# Patient Record
Sex: Male | Born: 1960 | Race: White | Hispanic: No | Marital: Single | Smoking: Former smoker
Health system: Southern US, Community
[De-identification: ages and names within clinical notes are randomized; demographics above are authoritative.]

## PROBLEM LIST (undated history)

## (undated) DIAGNOSIS — M459 Ankylosing spondylitis of unspecified sites in spine: Secondary | ICD-10-CM

## (undated) DIAGNOSIS — I10 Essential (primary) hypertension: Secondary | ICD-10-CM

## (undated) DIAGNOSIS — M549 Dorsalgia, unspecified: Secondary | ICD-10-CM

## (undated) DIAGNOSIS — M1990 Unspecified osteoarthritis, unspecified site: Secondary | ICD-10-CM

## (undated) DIAGNOSIS — M533 Sacrococcygeal disorders, not elsewhere classified: Secondary | ICD-10-CM

## (undated) DIAGNOSIS — G4730 Sleep apnea, unspecified: Secondary | ICD-10-CM

## (undated) DIAGNOSIS — G8929 Other chronic pain: Secondary | ICD-10-CM

## (undated) HISTORY — DX: Dorsalgia, unspecified: M54.9

## (undated) HISTORY — DX: Sleep apnea, unspecified: G47.30

## (undated) HISTORY — DX: Essential (primary) hypertension: I10

## (undated) HISTORY — DX: Ankylosing spondylitis of unspecified sites in spine: M45.9

## (undated) HISTORY — DX: Sacrococcygeal disorders, not elsewhere classified: M53.3

## (undated) HISTORY — DX: Other chronic pain: G89.29

---

## 2002-02-08 ENCOUNTER — Emergency Department (HOSPITAL_COMMUNITY): Admission: EM | Admit: 2002-02-08 | Discharge: 2002-02-09 | Payer: Self-pay | Admitting: *Deleted

## 2002-02-09 ENCOUNTER — Encounter: Payer: Self-pay | Admitting: *Deleted

## 2002-02-20 ENCOUNTER — Emergency Department (HOSPITAL_COMMUNITY): Admission: EM | Admit: 2002-02-20 | Discharge: 2002-02-20 | Payer: Self-pay | Admitting: Emergency Medicine

## 2010-07-13 ENCOUNTER — Ambulatory Visit (HOSPITAL_COMMUNITY)
Admission: RE | Admit: 2010-07-13 | Discharge: 2010-07-13 | Disposition: A | Discharge status: Home / Self Care | Payer: PRIVATE HEALTH INSURANCE | Source: Ambulatory Visit | Attending: Family Medicine | Admitting: Family Medicine

## 2010-07-13 ENCOUNTER — Other Ambulatory Visit (HOSPITAL_COMMUNITY): Payer: Self-pay | Admitting: Family Medicine

## 2010-07-13 DIAGNOSIS — M51379 Other intervertebral disc degeneration, lumbosacral region without mention of lumbar back pain or lower extremity pain: Secondary | ICD-10-CM | POA: Insufficient documentation

## 2010-07-13 DIAGNOSIS — M545 Low back pain, unspecified: Secondary | ICD-10-CM | POA: Insufficient documentation

## 2010-07-13 DIAGNOSIS — M549 Dorsalgia, unspecified: Secondary | ICD-10-CM

## 2010-07-13 DIAGNOSIS — M5137 Other intervertebral disc degeneration, lumbosacral region: Secondary | ICD-10-CM | POA: Insufficient documentation

## 2010-07-26 ENCOUNTER — Other Ambulatory Visit (HOSPITAL_COMMUNITY): Payer: Self-pay | Admitting: Orthopedic Surgery

## 2010-07-26 DIAGNOSIS — B999 Unspecified infectious disease: Secondary | ICD-10-CM

## 2010-07-27 ENCOUNTER — Other Ambulatory Visit (HOSPITAL_COMMUNITY): Payer: Self-pay | Admitting: Interventional Radiology

## 2010-07-27 DIAGNOSIS — B999 Unspecified infectious disease: Secondary | ICD-10-CM

## 2010-07-30 ENCOUNTER — Ambulatory Visit (HOSPITAL_COMMUNITY)
Admission: RE | Admit: 2010-07-30 | Discharge: 2010-07-30 | Disposition: A | Discharge status: Home / Self Care | Payer: PRIVATE HEALTH INSURANCE | Source: Ambulatory Visit | Attending: Orthopedic Surgery | Admitting: Orthopedic Surgery

## 2010-07-30 ENCOUNTER — Ambulatory Visit (HOSPITAL_COMMUNITY): Payer: PRIVATE HEALTH INSURANCE

## 2010-07-30 DIAGNOSIS — Z01812 Encounter for preprocedural laboratory examination: Secondary | ICD-10-CM | POA: Insufficient documentation

## 2010-07-30 DIAGNOSIS — M545 Low back pain, unspecified: Secondary | ICD-10-CM | POA: Insufficient documentation

## 2010-07-30 DIAGNOSIS — M79609 Pain in unspecified limb: Secondary | ICD-10-CM | POA: Insufficient documentation

## 2010-07-30 DIAGNOSIS — B999 Unspecified infectious disease: Secondary | ICD-10-CM

## 2010-07-30 LAB — CBC
HCT: 34.9 % — ABNORMAL LOW (ref 39.0–52.0)
Hemoglobin: 11.4 g/dL — ABNORMAL LOW (ref 13.0–17.0)
MCH: 27.1 pg (ref 26.0–34.0)
MCHC: 32.7 g/dL (ref 30.0–36.0)
MCV: 82.9 fL (ref 78.0–100.0)
Platelets: 431 10*3/uL — ABNORMAL HIGH (ref 150–400)
RBC: 4.21 MIL/uL — ABNORMAL LOW (ref 4.22–5.81)
RDW: 13.1 % (ref 11.5–15.5)
WBC: 9.9 10*3/uL (ref 4.0–10.5)

## 2010-07-30 LAB — POCT I-STAT, CHEM 8
Calcium, Ion: 1.11 mmol/L — ABNORMAL LOW (ref 1.12–1.32)
Glucose, Bld: 99 mg/dL (ref 70–99)
HCT: 37 % — ABNORMAL LOW (ref 39.0–52.0)
Hemoglobin: 12.6 g/dL — ABNORMAL LOW (ref 13.0–17.0)
TCO2: 27 mmol/L (ref 0–100)

## 2010-07-30 LAB — PROTIME-INR
INR: 1.15 (ref 0.00–1.49)
Prothrombin Time: 14.9 seconds (ref 11.6–15.2)

## 2010-07-30 LAB — GRAM STAIN

## 2010-08-03 LAB — BODY FLUID CULTURE

## 2010-08-04 LAB — ANAEROBIC CULTURE

## 2012-01-06 ENCOUNTER — Ambulatory Visit (HOSPITAL_COMMUNITY)
Admission: RE | Admit: 2012-01-06 | Discharge: 2012-01-06 | Disposition: A | Discharge status: Home / Self Care | Payer: PRIVATE HEALTH INSURANCE | Source: Ambulatory Visit | Attending: Rheumatology | Admitting: Rheumatology

## 2012-01-06 ENCOUNTER — Other Ambulatory Visit (HOSPITAL_COMMUNITY): Payer: Self-pay | Admitting: Rheumatology

## 2012-01-06 DIAGNOSIS — R52 Pain, unspecified: Secondary | ICD-10-CM

## 2012-01-06 DIAGNOSIS — M064 Inflammatory polyarthropathy: Secondary | ICD-10-CM | POA: Insufficient documentation

## 2012-07-13 ENCOUNTER — Ambulatory Visit: Payer: Self-pay

## 2012-07-13 ENCOUNTER — Other Ambulatory Visit: Payer: Self-pay | Admitting: Occupational Medicine

## 2012-07-13 ENCOUNTER — Encounter: Payer: Self-pay | Admitting: *Deleted

## 2012-07-13 DIAGNOSIS — M549 Dorsalgia, unspecified: Secondary | ICD-10-CM

## 2012-07-17 ENCOUNTER — Ambulatory Visit: Payer: Self-pay | Admitting: Family Medicine

## 2012-07-24 ENCOUNTER — Encounter: Payer: Self-pay | Admitting: Family Medicine

## 2012-07-24 ENCOUNTER — Ambulatory Visit (INDEPENDENT_AMBULATORY_CARE_PROVIDER_SITE_OTHER): Payer: PRIVATE HEALTH INSURANCE | Admitting: Family Medicine

## 2012-07-24 VITALS — BP 144/87 | HR 90 | Ht 71.0 in | Wt 335.6 lb

## 2012-07-24 DIAGNOSIS — M461 Sacroiliitis, not elsewhere classified: Secondary | ICD-10-CM

## 2012-07-24 DIAGNOSIS — F329 Major depressive disorder, single episode, unspecified: Secondary | ICD-10-CM

## 2012-07-24 DIAGNOSIS — I10 Essential (primary) hypertension: Secondary | ICD-10-CM

## 2012-07-24 MED ORDER — ALPRAZOLAM 0.5 MG PO TABS: 0.5000 mg | ORAL_TABLET | Freq: Three times a day (TID) | ORAL | Status: DC | PRN

## 2012-07-24 MED ORDER — OXYCODONE-ACETAMINOPHEN 10-325 MG PO TABS: 1.0000 | ORAL_TABLET | ORAL | Status: DC | PRN

## 2012-07-24 NOTE — Progress Notes (Signed)
  Subjective:    Patient ID: Charles Jenkins, male    DOB: 1960-11-04, 52 y.o.   MRN: [id number]  HPI patient arrives office for followup of multiple concerns. Trying to watch his salt. Not exercising much do to fairly severe pain. See prior note. Has been to the [facility]. doctors twice. Ongoing deep left leg pain. Tooth achy in nature. Radiates from hip to below knee. Currently due to see a rheumatologist soon. States his depression is flared up. Worried that this is sacroiliitis coming back. No suicidal or homicidal thoughts. Some trouble sleeping with the pain. Also notes that without regular exercises blood pressure will continue to rise. This concerns him also. Positive history of depression.    Review of Systems  Constitutional: Positive for fatigue.  Musculoskeletal: Positive for back pain.  All other systems reviewed and are negative.       Objective:   Physical Exam Alert somewhat anxious. HEENT normal. Lungs clear. Heart regular rate and rhythm. Low back tender to percussion. Positive left posterior sacroiliac notch tenderness. Positive straight leg raise. Distal strength currently intact. Sensation intact. No significant edema. Blood pressure 142/90 on repeat.      Assessment & Plan:  Impression #1 hypertension suboptimal in discussed. 2 early for medication change. #2 insomnia partly situational discuss. #3 depression somewhat worsening. Patient does not want medication. #4 chronic sacroiliitis discussed. #5 he merging sciatica secondary to work injury discussed. Plan change medicine to oxycodone. Rationale discussed. Followup as scheduled. Easily 25 minutes spent most in discussion with patient and wife. WSL

## 2012-08-12 ENCOUNTER — Other Ambulatory Visit: Payer: Self-pay | Admitting: Specialist

## 2012-08-12 DIAGNOSIS — M5416 Radiculopathy, lumbar region: Secondary | ICD-10-CM

## 2012-08-18 ENCOUNTER — Encounter: Payer: Self-pay | Admitting: Family Medicine

## 2012-08-18 ENCOUNTER — Ambulatory Visit (INDEPENDENT_AMBULATORY_CARE_PROVIDER_SITE_OTHER): Payer: PRIVATE HEALTH INSURANCE | Admitting: Family Medicine

## 2012-08-18 VITALS — BP 156/90 | HR 80 | Ht 71.0 in

## 2012-08-18 DIAGNOSIS — I10 Essential (primary) hypertension: Secondary | ICD-10-CM

## 2012-08-18 NOTE — Progress Notes (Signed)
  Subjective:    Patient ID: Charles Jenkins, male    DOB: [birth year], 52 y.o.   MRN: [id number]  Back Pain This is a recurrent problem. The current episode started more than 1 month ago. The problem occurs constantly. The problem has been gradually worsening since onset. The pain is present in the lumbar spine. The pain radiates to the left knee. The pain is at a severity of 7/10. The pain is moderate. The pain is worse during the day. The symptoms are aggravated by bending and coughing. Stiffness is present in the morning.    MRI reviewed.  Review of Systems  Musculoskeletal: Positive for back pain.   ROS otherwise negative.    Objective:   Physical Exam  Alert obvious distress. Vitals reviewed. Lungs clear. Heart regular rate and rhythm. Blood pressure still elevated on repeat but improved at 140/92. Low back tender to percussion. Positive straight leg raise.      Assessment & Plan:  Impression 1 hypertension discussed. #2 chronic sacroiliitis discussed. #3 sciatica with recent injury-may be facing surgery if injections don't help. Discussed. Plan pain medicine refilled. Diet exercise discussed. Followup as scheduled. WSL

## 2012-08-20 ENCOUNTER — Other Ambulatory Visit: Payer: Self-pay | Admitting: Specialist

## 2012-08-20 ENCOUNTER — Ambulatory Visit
Admission: RE | Admit: 2012-08-20 | Discharge: 2012-08-20 | Disposition: A | Discharge status: Home / Self Care | Payer: Worker's Compensation | Source: Ambulatory Visit | Attending: Specialist | Admitting: Specialist

## 2012-08-20 VITALS — BP 190/91 | HR 77

## 2012-08-20 DIAGNOSIS — M461 Sacroiliitis, not elsewhere classified: Secondary | ICD-10-CM

## 2012-08-20 DIAGNOSIS — M5416 Radiculopathy, lumbar region: Secondary | ICD-10-CM

## 2012-08-20 MED ORDER — METHYLPREDNISOLONE ACETATE 40 MG/ML INJ SUSP (RADIOLOG
120.0000 mg | Freq: Once | INTRAMUSCULAR | Status: AC
  Administered 2012-08-20: 120 mg via EPIDURAL

## 2012-08-20 MED ORDER — IOHEXOL 180 MG/ML  SOLN
1.0000 mL | Freq: Once | INTRAMUSCULAR | Status: AC | PRN
  Administered 2012-08-20: 1 mL via EPIDURAL

## 2012-08-24 ENCOUNTER — Other Ambulatory Visit: Payer: Self-pay | Admitting: Family Medicine

## 2012-08-24 NOTE — Telephone Encounter (Signed)
May refill x one

## 2012-09-02 ENCOUNTER — Other Ambulatory Visit: Payer: Self-pay | Admitting: Family Medicine

## 2012-09-02 NOTE — Telephone Encounter (Signed)
NOTE last Refill was 08/24/12.

## 2012-09-02 NOTE — Telephone Encounter (Signed)
wis if time

## 2012-09-03 ENCOUNTER — Telehealth: Payer: Self-pay | Admitting: *Deleted

## 2012-09-03 NOTE — Telephone Encounter (Signed)
Call to pt, takes xanax 0.5 TID for anxiousness for the last 3-4 months.  Helps cope with increase in personal issues

## 2012-09-03 NOTE — Telephone Encounter (Signed)
Pt said he takes xanax 0.5 mg TID for the last 3-4 months. Helps with increase in personal situations. In the chart he has had one refill of the xanax TID written on 08/04/12

## 2012-09-03 NOTE — Telephone Encounter (Signed)
Call pt. Ask him how often is he taking the xanax? Does he think he needs it at that frequency? Check chart. How often has he taken in the past?

## 2012-09-03 NOTE — Telephone Encounter (Signed)
Ninety xanax .5 tid. Five ref may ref monthly

## 2012-09-03 NOTE — Telephone Encounter (Signed)
Patient got # 30 10 days ago   Not sure if this was 30 day suppy- sig says 1 tab TID PRN but only was given 30 tabs

## 2012-09-04 NOTE — Telephone Encounter (Signed)
Rx printed and faxed to [facility] 

## 2012-09-15 ENCOUNTER — Telehealth: Payer: Self-pay | Admitting: Family Medicine

## 2012-09-15 NOTE — Telephone Encounter (Signed)
Pt has an appt with Dr Brett Canales on 5/28 for Pre Op clearance, his Percocet 10/325 runs out tomorrow. Can he get a refill for this med till he can get in to see Dr Brett Canales next week?

## 2012-09-15 NOTE — Telephone Encounter (Signed)
Dr. Lorin Picket wrote script for oxycodone 10/325 #60 one q 4-6 hours prn pain. Explained to pt he was getting #60 instead of #120 because Dr Brett Canales was out of office and Dr. Lorin Picket not sure of Dr. Soyla Dryer long term plan with pain med. Advised pt to follow up with Dr. Brett Canales.

## 2012-09-15 NOTE — Telephone Encounter (Signed)
Patient seen on 07/24/12 and received Oxycodone 10/325 mg #180 1 tab q 4 hrs prn. Patient was then seen on 08/18/12 and in the assessment and plan note it states "plan pain medicine refill".

## 2012-09-15 NOTE — Telephone Encounter (Signed)
Left message to call back with name of pharmacy.

## 2012-09-23 ENCOUNTER — Encounter: Payer: Self-pay | Admitting: Family Medicine

## 2012-09-23 ENCOUNTER — Ambulatory Visit (INDEPENDENT_AMBULATORY_CARE_PROVIDER_SITE_OTHER): Payer: PRIVATE HEALTH INSURANCE | Admitting: Family Medicine

## 2012-09-23 VITALS — BP 170/96 | Wt 333.0 lb

## 2012-09-23 DIAGNOSIS — M461 Sacroiliitis, not elsewhere classified: Secondary | ICD-10-CM

## 2012-09-23 DIAGNOSIS — M5431 Sciatica, right side: Secondary | ICD-10-CM

## 2012-09-23 DIAGNOSIS — F329 Major depressive disorder, single episode, unspecified: Secondary | ICD-10-CM

## 2012-09-23 DIAGNOSIS — M5430 Sciatica, unspecified side: Secondary | ICD-10-CM | POA: Insufficient documentation

## 2012-09-23 DIAGNOSIS — I10 Essential (primary) hypertension: Secondary | ICD-10-CM

## 2012-09-23 MED ORDER — BETAMETHASONE DIPROPIONATE AUG 0.05 % EX OINT: TOPICAL_OINTMENT | Freq: Two times a day (BID) | CUTANEOUS | Status: DC

## 2012-09-23 NOTE — Progress Notes (Signed)
  Subjective:    Patient ID: Charles Jenkins, male    DOB: 07-18-1960, 52 y.o.   MRN: [id number]  Back Pain This is a new problem. The current episode started more than 1 month ago. The problem occurs constantly. The problem has been gradually worsening since onset. The pain is present in the lumbar spine. The pain is at a severity of 7/10. The pain is severe. The pain is worse during the night.   The patient has had try which confirms a ruptured disc. He had local injections which helped minimally. He continues to have disabling sciatica pain. Unable to work. Positive history of hypertension. Generally on Humira for sacroiliitis. The specialist recommended he stop this for 2 weeks during the period surgical..  Patient is concerned how he will handle the surgery. However has had severe sciatica for nearly 3 months. Also notes cracking scaly rash in the feet.  Review of Systems  Musculoskeletal: Positive for back pain.   No shortness of breath no abdominal pain ROS otherwise negative.    Objective:   Physical Exam  Alert no acute distress, except when moving about. Vitals reviewed. Blood pressure improved on repeat 142 over and 90. Lungs clear. Heart regular in rhythm. Low back pain. Positive straight leg raise on right. Skin significant chronic dermatitis      Assessment & Plan:  Impression 1 ruptured cyst discussed at great length. Patient basically needs the surgery. #2 hypertension fair control. #3 chronic eczema feet. Plan Diprolene ointment twice a day to affected area. Oxycodone refilled. Patient to press on with potential surgery soon. WSL easily 25 minutes spent most in discussion.

## 2012-10-13 ENCOUNTER — Telehealth: Payer: Self-pay | Admitting: Family Medicine

## 2012-10-13 NOTE — Telephone Encounter (Signed)
Patient called to inquiry about form giving medical clearance for Surgery with [facility].  States he gave form to Dr. Brett Canales when he was seen on Sep 23, 2012.  [facility] needs this form before surgery can be scheduled.  Please fax to [phone].  Also Please call Patient when completed.  Form is attached to chart.  Thanks

## 2012-10-13 NOTE — Telephone Encounter (Signed)
Done let's send

## 2012-10-13 NOTE — Telephone Encounter (Signed)
Form faxed to number listed below and number on form. Patient was notified form was faxed.

## 2012-10-14 ENCOUNTER — Other Ambulatory Visit: Payer: Self-pay | Admitting: Orthopedic Surgery

## 2012-10-19 ENCOUNTER — Encounter (HOSPITAL_COMMUNITY): Payer: Self-pay | Admitting: Pharmacy Technician

## 2012-10-22 ENCOUNTER — Inpatient Hospital Stay (HOSPITAL_COMMUNITY): Admission: RE | Admit: 2012-10-22 | Payer: Worker's Compensation | Source: Ambulatory Visit

## 2012-10-23 ENCOUNTER — Encounter: Payer: Self-pay | Admitting: Family Medicine

## 2012-10-23 ENCOUNTER — Ambulatory Visit (INDEPENDENT_AMBULATORY_CARE_PROVIDER_SITE_OTHER): Payer: PRIVATE HEALTH INSURANCE | Admitting: Family Medicine

## 2012-10-23 VITALS — BP 139/89 | HR 80 | Wt 330.0 lb

## 2012-10-23 DIAGNOSIS — M5430 Sciatica, unspecified side: Secondary | ICD-10-CM

## 2012-10-23 DIAGNOSIS — I10 Essential (primary) hypertension: Secondary | ICD-10-CM

## 2012-10-23 DIAGNOSIS — M5432 Sciatica, left side: Secondary | ICD-10-CM

## 2012-10-23 DIAGNOSIS — M461 Sacroiliitis, not elsewhere classified: Secondary | ICD-10-CM

## 2012-10-23 DIAGNOSIS — J329 Chronic sinusitis, unspecified: Secondary | ICD-10-CM

## 2012-10-23 DIAGNOSIS — F329 Major depressive disorder, single episode, unspecified: Secondary | ICD-10-CM

## 2012-10-23 MED ORDER — AZITHROMYCIN 250 MG PO TABS: ORAL_TABLET | ORAL | Status: DC

## 2012-10-23 NOTE — Progress Notes (Signed)
  Subjective:    Patient ID: Charles Jenkins, male    DOB: [birth year], 52 y.o.   MRN: [id number]  HPI Ongoing pain. Due to have surg soon. Notes significant pain in the low spine. Also significant pain radiating down into the left leg and calf. Severe times. States deftly needs his medicine.  Trying to watch salt in his diet. Not exercising regularly.  Reports that his rheumatologist has stopped his sacroiliitis medication.   Reports that his insomnia is stable as long she stays with her Xanax.  Reports his depression is clinically stable.  Notes headache cough congestion sore throat progressive over the last several days. Once to stop it before it gets worse since he is facing surgery.  Review of Systems No chest pain no shortness of breath no abdominal pain no weight loss or weight gain ROS otherwise negative.    Objective:   Physical Exam Blood pressure repeat 140/90. Alert some discomfort. HEENT moderate nasal congestion pharynx slight erythema lungs rare rhonchi heart rare rhythm positive straight leg raise on the left positive low back pain.      Assessment & Plan:  Impression rhinitis bronchitis. #2 depression clinically stable #3 borderline blood pressure continue to follow. #4 sciatica severe with ongoing need for pain medicine. Plan pain medicine refilled. Z-Pak. Symptomatic care discussed. Cut down salt intake. Followup in one month. Blood pressure remains elevated will need to potentially initiate medicine. Easily 25 minutes spent on all these issues most in discussion. WSL

## 2012-10-26 ENCOUNTER — Other Ambulatory Visit: Payer: Self-pay | Admitting: Orthopedic Surgery

## 2012-10-26 ENCOUNTER — Encounter (HOSPITAL_COMMUNITY): Payer: Self-pay

## 2012-10-26 ENCOUNTER — Ambulatory Visit (HOSPITAL_COMMUNITY)
Admission: RE | Admit: 2012-10-26 | Discharge: 2012-10-26 | Disposition: A | Discharge status: Home / Self Care | Payer: Worker's Compensation | Source: Ambulatory Visit | Attending: Orthopedic Surgery | Admitting: Orthopedic Surgery

## 2012-10-26 ENCOUNTER — Encounter (HOSPITAL_COMMUNITY)
Admission: RE | Admit: 2012-10-26 | Discharge: 2012-10-26 | Disposition: A | Discharge status: Home / Self Care | Payer: Worker's Compensation | Source: Ambulatory Visit | Attending: Specialist | Admitting: Specialist

## 2012-10-26 DIAGNOSIS — M5137 Other intervertebral disc degeneration, lumbosacral region: Secondary | ICD-10-CM | POA: Insufficient documentation

## 2012-10-26 DIAGNOSIS — M51379 Other intervertebral disc degeneration, lumbosacral region without mention of lumbar back pain or lower extremity pain: Secondary | ICD-10-CM | POA: Insufficient documentation

## 2012-10-26 DIAGNOSIS — Z01818 Encounter for other preprocedural examination: Secondary | ICD-10-CM | POA: Insufficient documentation

## 2012-10-26 DIAGNOSIS — M5380 Other specified dorsopathies, site unspecified: Secondary | ICD-10-CM | POA: Insufficient documentation

## 2012-10-26 LAB — CBC
HCT: 44.1 % (ref 39.0–52.0)
MCHC: 32.9 g/dL (ref 30.0–36.0)
Platelets: 334 10*3/uL (ref 150–400)
RDW: 13 % (ref 11.5–15.5)
WBC: 7.8 10*3/uL (ref 4.0–10.5)

## 2012-10-26 LAB — BASIC METABOLIC PANEL
BUN: 11 mg/dL (ref 6–23)
Chloride: 100 mEq/L (ref 96–112)
Creatinine, Ser: 0.94 mg/dL (ref 0.50–1.35)
GFR calc Af Amer: >90 mL/min (ref >90)
GFR calc non Af Amer: >90 mL/min (ref >90)
Potassium: 4.1 mEq/L (ref 3.5–5.1)

## 2012-10-26 LAB — SURGICAL PCR SCREEN: Staphylococcus aureus: POSITIVE — AB

## 2012-10-26 NOTE — Progress Notes (Signed)
10/26/12 1042  OBSTRUCTIVE SLEEP APNEA  Have you ever been diagnosed with sleep apnea through a sleep study? No  Do you snore loudly (loud enough to be heard through closed doors)?  1  Do you often feel tired, fatigued, or sleepy during the daytime? 0  Has anyone observed you stop breathing during your sleep? 0  Do you have, or are you being treated for high blood pressure? 0  BMI more than 35 kg/m2? 1  Age over 50 years old? 0  Neck circumference greater than 40 cm/18 inches? 1  Gender: 1  Obstructive Sleep Apnea Score 4  Score 4 or greater  Results sent to PCP

## 2012-10-26 NOTE — H&P (Signed)
Charles Jenkins is an 51 y.o. male.   Chief Complaint: back and left leg pain HPI: The patient is a 51 year old male being followed for their low back symptoms. They are now 16 weeks out from injury. Symptoms reported today include: pain (left buttock and leg), numbness (left buttock and leg) and leg pain (left). The patient states that they are doing poorly. Current treatment includes: relative rest, activity modification and pain medications. The following medication has been used for pain control: Percocet and Gabapentin. The patient presents today following SNRB left L5 x 3 weeks, 4 day. The patient reports the injection helped for 3 or 4 days. The patient is currently out of work. NCM Susan Kesler.  Charles Jenkins follows up after his selective nerve root block. He had maybe three days relief from the injection. He is anesthetic in the L5 nerve root distribution according to the radiologist.   Past Medical History  Diagnosis Date  . Hypertension   . Back pain   . Depression   . Ankylosing spondylitis   . Chronic right SI joint pain     No past surgical history on file.  No family history on file. Social History:  reports that he has been smoking Cigars.  His smokeless tobacco use includes Snuff. His alcohol and drug histories are not on file.  Allergies: No Known Allergies   (Not in a hospital admission)  No results found for this or any previous visit (from the past 48 hour(s)). No results found.  Review of Systems  Constitutional: Negative.   HENT: Negative.   Eyes: Negative.   Respiratory: Negative.   Cardiovascular: Negative.   Gastrointestinal: Negative.   Genitourinary: Negative.   Musculoskeletal: Positive for back pain.  Skin: Negative.   Neurological: Negative.   Endo/Heme/Allergies: Negative.   Psychiatric/Behavioral: Negative.     There were no vitals taken for this visit. Physical Exam  Constitutional: He is oriented to person, place, and time. He appears  well-developed and well-nourished.  HENT:  Head: Normocephalic and atraumatic.  Eyes: Conjunctivae and EOM are normal. Pupils are equal, round, and reactive to light.  Neck: Normal range of motion. Neck supple.  Cardiovascular: Normal rate and regular rhythm.   Respiratory: Effort normal and breath sounds normal.  GI: Soft. Bowel sounds are normal.  Musculoskeletal:  He is in moderate distress. He walks with an antalgic gait. Mood and affect appropriate.  Lumbar spine exam reveals no evidence of soft tissue swelling, ecchymosis or deformity. The abdomen is soft and nontender. Nontender over the trochanters. No cellulitis or lymphadenopathy.  Straight leg raise produces buttock, thigh and calf pain on the left, negative on the right. He has EHL weakness on the left as compared to the right. Pain with extension. Motor is 5/5 including tibialis anterior, plantar flexion, quadriceps and hamstrings. He has diminished repetitive plantar flexion. There is no Babinski or clonus. Sensory exam is intact to light touch. The patient has good distal pulses. No DVT. No pain and normal range of motion without instability of the hips, knees and ankles.   Neurological: He is alert and oriented to person, place, and time. He has normal reflexes.  Skin: Skin is warm and dry.  Psychiatric: He has a normal mood and affect.    MRI demonstrates disk herniation at L5-S1 to the left with associated lateral recess stenosis. 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 Protrusion at L3-4.  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 Lateral recess stenosis at L4-5 and temporary relief from a diagnostic selective nerve root block at L5. Physical therapy has aggravated the symptoms.  Given the duration of his symptoms and the presence of a neurological deficit following a  diagnostic block and failure of conservative treatment we discussed living with his symptoms and pain management versus a lumbar decompression at L5-S1 and probably at L4-5 given his congenital stenosis and lateral recess stenosis.  I had an extensive discussion of the risks and benefits of the lumbar decompression with the patient including bleeding, infection, damage to neurovascular structures, epidural fibrosis, CSF leak requiring repair. We also discussed increase in pain, adjacent segment disease, recurrent disc herniation, need for future surgery including repeat decompression and/or fusion. We also discussed risks of postoperative hematoma, paralysis, anesthetic complications including DVT, PE, death, cardiopulmonary dysfunction. In addition, the perioperative and postoperative courses were discussed in detail including the rehabilitative time and return to functional activity and work. I provided the patient with an illustrated handout and utilized the appropriate surgical models.  He is out of work as no light duties are available. He has no history of cardiac disease, pulmonary disease, DVT or MRSA exposure. He does have coronary artery disease in his family. He would like to proceed with a surgical decompression. I appreciate input from his medical physician, Dr. Luking. I discussed this in front of the patient with [name], case manager. I reviewed his physical therapy note as well.  Plan microlumbar decompression L4-5, L5-S1  [name], [name] for Dr. Beane 10/26/2012, 8:12 AM    

## 2012-10-26 NOTE — Patient Instructions (Addendum)
20      Your procedure is scheduled on:  Wednesday 10/28/2012  Report to [facility] at    0630 AM.  Call this number if you have problems the morning of surgery: [phone]   Remember:             IF YOU USE CPAP,BRING MASK AND TUBING AM OF SURGERY!   Do not eat food or drink liquids AFTER MIDNIGHT!  Take these medicines the morning of surgery with A SIP OF WATER: NONE   Do not bring valuables to the hospital. [facility] IS NOT RESPONSIBLE FOR ANY BELONGINGS OR VALUABLES.  Wynelle Fanny suitcase in the car. After surgery it may be brought to your room.  For patients admitted to the hospital, checkout time is 11:00 AM the day of              Discharge.    DO NOT WEAR JEWELRY , MAKE-UP, LOTIONS,POWDERS,PERFUMES!             WOMEN -DO NOT SHAVE LEGS OR UNDERARMS 12 HRS. BEFORE  SURGERY!               MEN MAY SHAVE AS USUAL!             CONTACTS,DENTURES OR BRIDGEWORK, FALSE EYELASHES MAY NOT BE WORN INTO SURGERY!                                           Patients discharged the day of surgery will not be allowed to drive home. If going home the same day of surgery, must have someone stay with you first 24 hrs.at home and arrange for someone to drive you home from the Hospital.                         YOUR DRIVER IS: Lynden Ang Hodges-fiancee   Special Instructions:             Please read over the following fact sheets that you were given:             1. [facility] PREPARING FOR SURGERY SHEET              2.MRSA INFORMATION              3.[facility]                                        Telford Nab.Zaliah Wissner,RN,BSN     [phone]                FAILURE TO FOLLOW THESE INSTRUCTIONS MAY RESULT IN CANCELLATION OF YOUR SURGERY!               Patient Signature:___________________________

## 2012-10-26 NOTE — Progress Notes (Signed)
Faxed results of Surgical pcr screen to Dr. Shelle Iron and called Rosalva Ferron office and left her a voicemail about abnormal pcr screen on patient.

## 2012-10-27 ENCOUNTER — Other Ambulatory Visit: Payer: Self-pay | Admitting: Orthopedic Surgery

## 2012-10-27 NOTE — Anesthesia Preprocedure Evaluation (Addendum)
Anesthesia Evaluation  Patient identified by MRN, date of birth, ID band Patient awake    Reviewed: Allergy & Precautions, H&P , NPO status , Patient's Chart, lab work & pertinent test results  Airway Mallampati: II TM Distance: >3 FB Neck ROM: Full    Dental  (+) Teeth Intact and Dental Advisory Given   Pulmonary neg pulmonary ROS,  breath sounds clear to auscultation  Pulmonary exam normal       Cardiovascular hypertension, negative cardio ROS  Rhythm:Regular Rate:Normal     Neuro/Psych Depression  Neuromuscular disease negative neurological ROS  negative psych ROS   GI/Hepatic negative GI ROS, Neg liver ROS,   Endo/Other  Morbid obesity  Renal/GU negative Renal ROS  negative genitourinary   Musculoskeletal negative musculoskeletal ROS (+)   Abdominal   Peds  Hematology negative hematology ROS (+)   Anesthesia Other Findings   Reproductive/Obstetrics                          Anesthesia Physical Anesthesia Plan  ASA: III  Anesthesia Plan: General   Post-op Pain Management:    Induction: Intravenous  Airway Management Planned: Oral ETT  Additional Equipment:   Intra-op Plan:   Post-operative Plan: Extubation in OR  Informed Consent: I have reviewed the patients History and Physical, chart, labs and discussed the procedure including the risks, benefits and alternatives for the proposed anesthesia with the patient or authorized representative who has indicated his/her understanding and acceptance.   Dental advisory given  Plan Discussed with: CRNA  Anesthesia Plan Comments:         Anesthesia Quick Evaluation

## 2012-10-28 ENCOUNTER — Encounter (HOSPITAL_COMMUNITY): Payer: Self-pay | Admitting: *Deleted

## 2012-10-28 ENCOUNTER — Ambulatory Visit (HOSPITAL_COMMUNITY): Payer: Worker's Compensation

## 2012-10-28 ENCOUNTER — Encounter (HOSPITAL_COMMUNITY)
Admission: RE | Disposition: A | Discharge status: Home / Self Care | Payer: Self-pay | Source: Ambulatory Visit | Attending: Specialist

## 2012-10-28 ENCOUNTER — Observation Stay (HOSPITAL_COMMUNITY)
Admission: RE | Admit: 2012-10-28 | Discharge: 2012-10-28 | Disposition: A | Discharge status: Home / Self Care | Payer: Worker's Compensation | Source: Ambulatory Visit | Attending: Specialist | Admitting: Specialist

## 2012-10-28 ENCOUNTER — Encounter (HOSPITAL_COMMUNITY): Payer: Self-pay | Admitting: Anesthesiology

## 2012-10-28 ENCOUNTER — Ambulatory Visit (HOSPITAL_COMMUNITY): Payer: Worker's Compensation | Admitting: Anesthesiology

## 2012-10-28 DIAGNOSIS — I10 Essential (primary) hypertension: Secondary | ICD-10-CM | POA: Insufficient documentation

## 2012-10-28 DIAGNOSIS — M48061 Spinal stenosis, lumbar region without neurogenic claudication: Secondary | ICD-10-CM

## 2012-10-28 DIAGNOSIS — Z6841 Body Mass Index (BMI) 40.0 and over, adult: Secondary | ICD-10-CM | POA: Insufficient documentation

## 2012-10-28 DIAGNOSIS — M5126 Other intervertebral disc displacement, lumbar region: Principal | ICD-10-CM | POA: Insufficient documentation

## 2012-10-28 HISTORY — PX: LUMBAR LAMINECTOMY/DECOMPRESSION MICRODISCECTOMY: SHX5026

## 2012-10-28 SURGERY — LUMBAR LAMINECTOMY/DECOMPRESSION MICRODISCECTOMY 2 LEVELS
Anesthesia: General | Site: Back | Laterality: Left | Wound class: Clean

## 2012-10-28 MED ORDER — CITALOPRAM HYDROBROMIDE 40 MG PO TABS
40.0000 mg | ORAL_TABLET | Freq: Every day | ORAL | Status: DC
  Filled 2012-10-28: qty 1

## 2012-10-28 MED ORDER — METHOCARBAMOL 100 MG/ML IJ SOLN
500.0000 mg | Freq: Four times a day (QID) | INTRAMUSCULAR | Status: DC | PRN
  Administered 2012-10-28: 500 mg via INTRAVENOUS
  Filled 2012-10-28: qty 5

## 2012-10-28 MED ORDER — OXYCODONE-ACETAMINOPHEN 10-325 MG PO TABS: 1.0000 | ORAL_TABLET | ORAL | Status: DC | PRN

## 2012-10-28 MED ORDER — ACETAMINOPHEN 650 MG RE SUPP: 650.0000 mg | RECTAL | Status: DC | PRN

## 2012-10-28 MED ORDER — SODIUM CHLORIDE 0.45 % IV SOLN
INTRAVENOUS | Status: DC
  Administered 2012-10-28: 50 mL/h via INTRAVENOUS

## 2012-10-28 MED ORDER — LIDOCAINE HCL (CARDIAC) 20 MG/ML IV SOLN
INTRAVENOUS | Status: DC | PRN
  Administered 2012-10-28: 100 mg via INTRAVENOUS

## 2012-10-28 MED ORDER — OXYCODONE HCL 5 MG PO TABS: 5.0000 mg | ORAL_TABLET | ORAL | Status: DC | PRN

## 2012-10-28 MED ORDER — BISACODYL 10 MG RE SUPP: 10.0000 mg | Freq: Every day | RECTAL | Status: DC | PRN

## 2012-10-28 MED ORDER — HYDROMORPHONE HCL PF 1 MG/ML IJ SOLN: 0.5000 mg | INTRAMUSCULAR | Status: DC | PRN

## 2012-10-28 MED ORDER — DOCUSATE SODIUM 100 MG PO CAPS: 100.0000 mg | ORAL_CAPSULE | Freq: Two times a day (BID) | ORAL | Status: DC

## 2012-10-28 MED ORDER — SODIUM CHLORIDE 0.9 % IV SOLN: 250.0000 mL | INTRAVENOUS | Status: DC

## 2012-10-28 MED ORDER — CLINDAMYCIN PHOSPHATE 900 MG/50ML IV SOLN
INTRAVENOUS | Status: AC
  Filled 2012-10-28: qty 50

## 2012-10-28 MED ORDER — LACTATED RINGERS IV SOLN
INTRAVENOUS | Status: DC
  Administered 2012-10-28 (×2): via INTRAVENOUS

## 2012-10-28 MED ORDER — METHOCARBAMOL 500 MG PO TABS: 500.0000 mg | ORAL_TABLET | Freq: Four times a day (QID) | ORAL | Status: DC | PRN

## 2012-10-28 MED ORDER — METOCLOPRAMIDE HCL 5 MG/ML IJ SOLN
INTRAMUSCULAR | Status: DC | PRN
  Administered 2012-10-28: 5 mg via INTRAVENOUS

## 2012-10-28 MED ORDER — CLINDAMYCIN PHOSPHATE 900 MG/50ML IV SOLN
900.0000 mg | INTRAVENOUS | Status: AC
  Administered 2012-10-28: 900 mg via INTRAVENOUS

## 2012-10-28 MED ORDER — CISATRACURIUM BESYLATE (PF) 10 MG/5ML IV SOLN
INTRAVENOUS | Status: DC | PRN
  Administered 2012-10-28: 1 mg via INTRAVENOUS
  Administered 2012-10-28: 4 mg via INTRAVENOUS
  Administered 2012-10-28: 6 mg via INTRAVENOUS

## 2012-10-28 MED ORDER — CEFAZOLIN SODIUM 1-5 GM-% IV SOLN
INTRAVENOUS | Status: AC
  Filled 2012-10-28: qty 50

## 2012-10-28 MED ORDER — LACTATED RINGERS IV SOLN: INTRAVENOUS | Status: DC

## 2012-10-28 MED ORDER — BUPIVACAINE-EPINEPHRINE 0.5% -1:200000 IJ SOLN
INTRAMUSCULAR | Status: AC
  Filled 2012-10-28: qty 1

## 2012-10-28 MED ORDER — BUPIVACAINE-EPINEPHRINE 0.5% -1:200000 IJ SOLN
INTRAMUSCULAR | Status: DC | PRN
  Administered 2012-10-28: 25 mL

## 2012-10-28 MED ORDER — DEXAMETHASONE SODIUM PHOSPHATE 10 MG/ML IJ SOLN
INTRAMUSCULAR | Status: DC | PRN
  Administered 2012-10-28: 10 mg via INTRAVENOUS

## 2012-10-28 MED ORDER — SUCCINYLCHOLINE CHLORIDE 20 MG/ML IJ SOLN
INTRAMUSCULAR | Status: DC | PRN
  Administered 2012-10-28: 140 mg via INTRAVENOUS

## 2012-10-28 MED ORDER — NEOSTIGMINE METHYLSULFATE 1 MG/ML IJ SOLN
INTRAMUSCULAR | Status: DC | PRN
  Administered 2012-10-28: 2 mg via INTRAVENOUS

## 2012-10-28 MED ORDER — ACETAMINOPHEN 325 MG PO TABS: 650.0000 mg | ORAL_TABLET | ORAL | Status: DC | PRN

## 2012-10-28 MED ORDER — ALPRAZOLAM 0.5 MG PO TABS: 0.5000 mg | ORAL_TABLET | Freq: Three times a day (TID) | ORAL | Status: DC | PRN

## 2012-10-28 MED ORDER — FLEET ENEMA 7-19 GM/118ML RE ENEM: 1.0000 | ENEMA | Freq: Once | RECTAL | Status: DC | PRN

## 2012-10-28 MED ORDER — OXYCODONE-ACETAMINOPHEN 5-325 MG PO TABS: 1.0000 | ORAL_TABLET | ORAL | Status: DC | PRN

## 2012-10-28 MED ORDER — FENTANYL CITRATE 0.05 MG/ML IJ SOLN
INTRAMUSCULAR | Status: DC | PRN
  Administered 2012-10-28 (×8): 50 ug via INTRAVENOUS
  Administered 2012-10-28: 100 ug via INTRAVENOUS

## 2012-10-28 MED ORDER — DEXTROSE 5 % IV SOLN: 3.0000 g | Freq: Three times a day (TID) | INTRAVENOUS | Status: DC

## 2012-10-28 MED ORDER — ONDANSETRON HCL 4 MG/2ML IJ SOLN: 4.0000 mg | INTRAMUSCULAR | Status: DC | PRN

## 2012-10-28 MED ORDER — HYDROMORPHONE HCL PF 1 MG/ML IJ SOLN
0.2500 mg | INTRAMUSCULAR | Status: DC | PRN
  Administered 2012-10-28: 0.5 mg via INTRAVENOUS

## 2012-10-28 MED ORDER — SODIUM CHLORIDE 0.9 % IJ SOLN: 3.0000 mL | INTRAMUSCULAR | Status: DC | PRN

## 2012-10-28 MED ORDER — CEPHALEXIN 750 MG PO CAPS: 750.0000 mg | ORAL_CAPSULE | Freq: Four times a day (QID) | ORAL | Status: DC

## 2012-10-28 MED ORDER — THROMBIN 5000 UNITS EX SOLR
CUTANEOUS | Status: DC | PRN
  Administered 2012-10-28: 10000 [IU] via TOPICAL

## 2012-10-28 MED ORDER — GLYCOPYRROLATE 0.2 MG/ML IJ SOLN
INTRAMUSCULAR | Status: DC | PRN
  Administered 2012-10-28: .4 mg via INTRAVENOUS

## 2012-10-28 MED ORDER — PROPOFOL 10 MG/ML IV BOLUS
INTRAVENOUS | Status: DC | PRN
  Administered 2012-10-28: 220 mg via INTRAVENOUS

## 2012-10-28 MED ORDER — DEXTROSE 5 % IV SOLN
3.0000 g | INTRAVENOUS | Status: AC
  Administered 2012-10-28: 3 g via INTRAVENOUS
  Filled 2012-10-28: qty 3000

## 2012-10-28 MED ORDER — SODIUM CHLORIDE 0.9 % IR SOLN
Status: DC | PRN
  Administered 2012-10-28: 11:00:00

## 2012-10-28 MED ORDER — ONDANSETRON HCL 4 MG/2ML IJ SOLN
INTRAMUSCULAR | Status: DC | PRN
  Administered 2012-10-28 (×2): 2 mg via INTRAVENOUS

## 2012-10-28 MED ORDER — CEFAZOLIN SODIUM-DEXTROSE 2-3 GM-% IV SOLR
2.0000 g | Freq: Three times a day (TID) | INTRAVENOUS | Status: DC
  Administered 2012-10-28: 2 g via INTRAVENOUS
  Filled 2012-10-28: qty 50

## 2012-10-28 MED ORDER — HYDROCODONE-ACETAMINOPHEN 5-325 MG PO TABS: 1.0000 | ORAL_TABLET | ORAL | Status: DC | PRN

## 2012-10-28 MED ORDER — MIDAZOLAM HCL 5 MG/5ML IJ SOLN
INTRAMUSCULAR | Status: DC | PRN
  Administered 2012-10-28 (×2): 1 mg via INTRAVENOUS

## 2012-10-28 MED ORDER — METHOCARBAMOL 500 MG PO TABS: 500.0000 mg | ORAL_TABLET | Freq: Four times a day (QID) | ORAL | Status: DC

## 2012-10-28 MED ORDER — SENNOSIDES-DOCUSATE SODIUM 8.6-50 MG PO TABS
1.0000 | ORAL_TABLET | Freq: Every evening | ORAL | Status: DC | PRN
  Filled 2012-10-28: qty 1

## 2012-10-28 MED ORDER — ALUM & MAG HYDROXIDE-SIMETH 200-200-20 MG/5ML PO SUSP: 30.0000 mL | Freq: Four times a day (QID) | ORAL | Status: DC | PRN

## 2012-10-28 MED ORDER — MENTHOL 3 MG MT LOZG
1.0000 | LOZENGE | OROMUCOSAL | Status: DC | PRN
  Filled 2012-10-28: qty 9

## 2012-10-28 MED ORDER — HYDROMORPHONE HCL PF 1 MG/ML IJ SOLN
INTRAMUSCULAR | Status: AC
  Filled 2012-10-28: qty 1

## 2012-10-28 MED ORDER — ACETAMINOPHEN 10 MG/ML IV SOLN
INTRAVENOUS | Status: DC | PRN
  Administered 2012-10-28: 1000 mg via INTRAVENOUS

## 2012-10-28 MED ORDER — ZOLPIDEM TARTRATE 5 MG PO TABS: 5.0000 mg | ORAL_TABLET | Freq: Every evening | ORAL | Status: DC | PRN

## 2012-10-28 MED ORDER — THROMBIN 5000 UNITS EX SOLR
CUTANEOUS | Status: AC
  Filled 2012-10-28: qty 10000

## 2012-10-28 MED ORDER — PHENOL 1.4 % MT LIQD: 1.0000 | OROMUCOSAL | Status: DC | PRN

## 2012-10-28 MED ORDER — PROMETHAZINE HCL 25 MG/ML IJ SOLN: 6.2500 mg | INTRAMUSCULAR | Status: DC | PRN

## 2012-10-28 MED ORDER — CEFAZOLIN SODIUM-DEXTROSE 2-3 GM-% IV SOLR
INTRAVENOUS | Status: AC
  Filled 2012-10-28: qty 50

## 2012-10-28 MED ORDER — ACETAMINOPHEN 10 MG/ML IV SOLN
INTRAVENOUS | Status: AC
  Filled 2012-10-28: qty 100

## 2012-10-28 MED ORDER — SODIUM CHLORIDE 0.9 % IJ SOLN: 3.0000 mL | Freq: Two times a day (BID) | INTRAMUSCULAR | Status: DC

## 2012-10-28 SURGICAL SUPPLY — 50 items
BAG ZIPLOCK 12X15 (MISCELLANEOUS) ×2 IMPLANT
BENZOIN TINCTURE PRP APPL 2/3 (GAUZE/BANDAGES/DRESSINGS) ×4 IMPLANT
CHLORAPREP W/TINT 26ML (MISCELLANEOUS) IMPLANT
CLEANER TIP ELECTROSURG 2X2 (MISCELLANEOUS) ×2 IMPLANT
CLOTH 2% CHLOROHEXIDINE 3PK (PERSONAL CARE ITEMS) ×2 IMPLANT
CLOTH BEACON ORANGE TIMEOUT ST (SAFETY) ×2 IMPLANT
DECANTER SPIKE VIAL GLASS SM (MISCELLANEOUS) ×2 IMPLANT
DRAPE MICROSCOPE LEICA (MISCELLANEOUS) ×2 IMPLANT
DRAPE POUCH INSTRU U-SHP 10X18 (DRAPES) ×2 IMPLANT
DRAPE SURG 17X11 SM STRL (DRAPES) ×2 IMPLANT
DRSG AQUACEL AG ADV 3.5X 6 (GAUZE/BANDAGES/DRESSINGS) ×2 IMPLANT
DRSG EMULSION OIL 3X3 NADH (GAUZE/BANDAGES/DRESSINGS) IMPLANT
DRSG PAD ABDOMINAL 8X10 ST (GAUZE/BANDAGES/DRESSINGS) IMPLANT
DRSG TELFA 4X5 ISLAND ADH (GAUZE/BANDAGES/DRESSINGS) IMPLANT
DURAPREP 26ML APPLICATOR (WOUND CARE) ×2 IMPLANT
ELECT REM PT RETURN 9FT ADLT (ELECTROSURGICAL) ×2
ELECTRODE REM PT RTRN 9FT ADLT (ELECTROSURGICAL) ×1 IMPLANT
GLOVE BIOGEL PI IND STRL 7.5 (GLOVE) ×1 IMPLANT
GLOVE BIOGEL PI IND STRL 8 (GLOVE) ×1 IMPLANT
GLOVE BIOGEL PI INDICATOR 7.5 (GLOVE) ×1
GLOVE BIOGEL PI INDICATOR 8 (GLOVE) ×1
GLOVE SURG SS PI 7.5 STRL IVOR (GLOVE) ×2 IMPLANT
GLOVE SURG SS PI 8.0 STRL IVOR (GLOVE) ×4 IMPLANT
GOWN PREVENTION PLUS LG XLONG (DISPOSABLE) ×2 IMPLANT
GOWN STRL REIN XL XLG (GOWN DISPOSABLE) ×4 IMPLANT
KIT BASIN OR (CUSTOM PROCEDURE TRAY) ×2 IMPLANT
KIT POSITIONING SURG ANDREWS (MISCELLANEOUS) ×2 IMPLANT
MANIFOLD NEPTUNE II (INSTRUMENTS) ×2 IMPLANT
NEEDLE SPNL 18GX3.5 QUINCKE PK (NEEDLE) ×6 IMPLANT
PATTIES SURGICAL .5 X.5 (GAUZE/BANDAGES/DRESSINGS) IMPLANT
PATTIES SURGICAL .75X.75 (GAUZE/BANDAGES/DRESSINGS) IMPLANT
PATTIES SURGICAL 1X1 (DISPOSABLE) IMPLANT
SPONGE SURGIFOAM ABS GEL 100 (HEMOSTASIS) ×2 IMPLANT
STAPLER VISISTAT (STAPLE) IMPLANT
STRIP CLOSURE SKIN 1/2X4 (GAUZE/BANDAGES/DRESSINGS) IMPLANT
SUT PROLENE 3 0 PS 2 (SUTURE) IMPLANT
SUT VIC AB 0 CT1 27 (SUTURE)
SUT VIC AB 0 CT1 27XBRD ANTBC (SUTURE) IMPLANT
SUT VIC AB 1 CT1 27 (SUTURE) ×1
SUT VIC AB 1 CT1 27XBRD ANTBC (SUTURE) ×1 IMPLANT
SUT VIC AB 1 CT1 36 (SUTURE) ×2 IMPLANT
SUT VIC AB 1-0 CT2 27 (SUTURE) ×2 IMPLANT
SUT VIC AB 2-0 CT1 27 (SUTURE) ×2
SUT VIC AB 2-0 CT1 TAPERPNT 27 (SUTURE) ×2 IMPLANT
SUT VIC AB 2-0 CT2 27 (SUTURE) ×4 IMPLANT
SUT VICRYL 0 27 CT2 27 ABS (SUTURE) ×4 IMPLANT
SUT VICRYL 0 UR6 27IN ABS (SUTURE) IMPLANT
SYRINGE 10CC LL (SYRINGE) ×4 IMPLANT
TRAY LAMINECTOMY (CUSTOM PROCEDURE TRAY) ×2 IMPLANT
YANKAUER SUCT BULB TIP NO VENT (SUCTIONS) ×2 IMPLANT

## 2012-10-28 NOTE — Evaluation (Signed)
Occupational Therapy Evaluation Patient Details Name: TASHAN KREITZER MRN: [id number] DOB: 02-02-1961 Today's Date: 10/28/2012 Time: 2841-3244 OT Time Calculation (min): 23 min  OT Assessment / Plan / Recommendation History of present illness  ankylosing spondylitis and SI joint pain   Clinical Impression   Pt is a 52 year old man who was admitted for L4-5 and L5-S1 decompression.  All education was completed.  Pt does not need any further OT at this time.      OT Assessment  Patient does not need any further OT services    Follow Up Recommendations  No OT follow up    Barriers to Discharge      Equipment Recommendations  None recommended by OT    Recommendations for Other Services    Frequency       Precautions / Restrictions Precautions Precautions: Back Precaution Booklet Issued: Yes (comment) Restrictions Weight Bearing Restrictions: No   Pertinent Vitals/Pain 5/10 back.  Repositioned    ADL  Toilet Transfer: Performed;Min guard (comfort height commode) Toilet Transfer Method: Sit to stand Toilet Transfer Equipment: Comfort height toilet Transfers/Ambulation Related to ADLs: ambulates supervision ADL Comments: Pt needs mod to max A for LB adls (bathing/dressing) as he used to bend forward to reach below knees. UB adls are set up/supervision. Wife will help.  Showed alternative ways he can safely perform adls, but wife will likely help.  They do have a reacher: educated not to lift heavy items as they will weigh more with long lever arm.  Demonstrated technique for stepping into tub.  Educated on toilet aid.  They also have wet wipes at home.  Pt has a standard commode at home and feels he will be able to access this.  Our commode is 2 1/2 inches higher than a standard.  Handout given.  Pt/wife verbalize understanding of all education.      OT Diagnosis:    OT Problem List:   OT Treatment Interventions:     OT Goals(Current goals can be found in the care plan  section) Acute Rehab OT Goals Patient Stated Goal: go home today  Visit Information  Last OT Received On: 10/28/12 Assistance Needed: +1 PT/OT Co-Evaluation/Treatment: Yes       Prior Functioning     Home Living Family/patient expects to be discharged to:: Private residence Living Arrangements: Spouse/significant other Available Help at Discharge: Family Type of Home: House Home Access: Stairs to enter Secretary/administrator of Steps: 1 Entrance Stairs-Rails: None Home Layout: One level Home Equipment: Environmental consultant - standard Additional Comments: sink next to toilet Prior Function Level of Independence: Independent Communication Communication: No difficulties         Vision/Perception     Cognition  Cognition Arousal/Alertness: Awake/alert Behavior During Therapy: WFL for tasks assessed/performed Overall Cognitive Status: Within Functional Limits for tasks assessed    Extremity/Trunk Assessment Upper Extremity Assessment Upper Extremity Assessment: Overall WFL for tasks assessed Lower Extremity Assessment Lower Extremity Assessment: Overall WFL for tasks assessed Cervical / Trunk Assessment Cervical / Trunk Assessment: Normal     Mobility Bed Mobility Bed Mobility: Rolling Left;Left Sidelying to Sit Rolling Left: 5: Supervision Left Sidelying to Sit: 5: Supervision Supine to Sit: 5: Supervision Details for Bed Mobility Assistance: cues for technique Transfers Transfers: Sit to Stand Sit to Stand: 5: Supervision Stand to Sit: 5: Supervision Details for Transfer Assistance: pt able to keep back straight     Exercise     Balance     End of Session OT -  End of Session Activity Tolerance: Patient tolerated treatment well Patient left: in chair;with call bell/phone within reach;with family/visitor present  GO Functional Assessment Tool Used: clnical judgment Functional Limitation: Self care Self Care Current Status (Z6109): At least 60 percent but less  than 80 percent impaired, limited or restricted Self Care Goal Status (U0454): At least 60 percent but less than 80 percent impaired, limited or restricted Self Care Discharge Status [phone]): At least 60 percent but less than 80 percent impaired, limited or restricted   Etoy Mcdonnell 10/28/2012, 2:16 PM Marica Otter, OTR/L [phone] 10/28/2012

## 2012-10-28 NOTE — Interval H&P Note (Signed)
History and Physical Interval Note:  10/28/2012 8:17 AM  Charles Jenkins  has presented today for surgery, with the diagnosis of HNP and Stenosis  The various methods of treatment have been discussed with the patient and family. After consideration of risks, benefits and other options for treatment, the patient has consented to  Procedure(s): MICRO LUMBAR DECOMPRESSION L4 - L5 and L5 - S1 2 LEVELS (N/A) as a surgical intervention .  The patient's history has been reviewed, patient examined, no change in status, stable for surgery.  I have reviewed the patient's chart and labs.  Questions were answered to the patient's satisfaction.     Kortne All C

## 2012-10-28 NOTE — Progress Notes (Signed)
10/28/12 1332  PT G-Codes **NOT FOR INPATIENT CLASS**  Functional Assessment Tool Used Clinical judgement  Functional Limitation Mobility: Walking and moving around  Mobility: Walking and Moving Around Current Status (W0981) CI  Mobility: Walking and Moving Around Goal Status [phone]) CI  Mobility: Walking and Moving Around Discharge Status [phone]) CI

## 2012-10-28 NOTE — Evaluation (Signed)
Physical Therapy Evaluation Patient Details Name: Charles Jenkins MRN: [id number] DOB: 07/03/1960 Today's Date: 10/28/2012 Time: 4098-1191 PT Time Calculation (min): 27 min  PT Assessment / Plan / Recommendation History of Present Illness     Clinical Impression  Pt s/p microdecompression L4-5 and L5-S1 presents with functional mobility limitations 2* post op pain and back precautions.  Pt is currently at supervision level with all mobility tasks and has 24/7 assist in home.  No further PT needs identified at this time.    PT Assessment  Patent does not need any further PT services    Follow Up Recommendations  No PT follow up    Does the patient have the potential to tolerate intense rehabilitation      Barriers to Discharge        Equipment Recommendations  None recommended by PT    Recommendations for Other Services     Frequency      Precautions / Restrictions Precautions Precautions: Back Precaution Booklet Issued: Yes (comment) Restrictions Weight Bearing Restrictions: No   Pertinent Vitals/Pain 5/10 back pain with no LE pain.      Mobility  Bed Mobility Bed Mobility: Rolling Left;Left Sidelying to Sit Supine to Sit: 5: Supervision Details for Bed Mobility Assistance: cues for correct log roll technique Transfers Transfers: Sit to Stand;Stand to Sit Sit to Stand: 5: Supervision Stand to Sit: 5: Supervision Details for Transfer Assistance: cues for transition position, use of UEs and avoidance of excess fwd flex Ambulation/Gait Ambulation/Gait Assistance: 5: Supervision Ambulation Distance (Feet): 180 Feet Assistive device: None Gait Pattern: Within Functional Limits;Wide base of support Stairs: Yes Stairs Assistance: 4: Min guard Stair Management Technique: No rails;Step to pattern Number of Stairs: 1 (twice)    Exercises     PT Diagnosis:    PT Problem List:   PT Treatment Interventions:       PT Goals(Current goals can be found in the care  plan section) Acute Rehab PT Goals Patient Stated Goal: Home PT Goal Formulation: No goals set, d/c therapy  Visit Information  Last PT Received On: 10/28/12 Assistance Needed: +1 PT/OT Co-Evaluation/Treatment: Yes       Prior Functioning  Home Living Family/patient expects to be discharged to:: Private residence Living Arrangements: Spouse/significant other Available Help at Discharge: Family Type of Home: House Home Access: Stairs to enter Secretary/administrator of Steps: 1 Entrance Stairs-Rails: None Home Layout: One level Home Equipment: Environmental consultant - standard Additional Comments: sink next to toilet Prior Function Level of Independence: Independent Communication Communication: No difficulties    Cognition  Cognition Arousal/Alertness: Awake/alert Behavior During Therapy: WFL for tasks assessed/performed Overall Cognitive Status: Within Functional Limits for tasks assessed    Extremity/Trunk Assessment Upper Extremity Assessment Upper Extremity Assessment: Overall WFL for tasks assessed Lower Extremity Assessment Lower Extremity Assessment: Overall WFL for tasks assessed Cervical / Trunk Assessment Cervical / Trunk Assessment: Normal   Balance    End of Session PT - End of Session Activity Tolerance: Patient tolerated treatment well Patient left: in chair;with call bell/phone within reach;with family/visitor present;with nursing/sitter in room Nurse Communication: Mobility status  GP     Mattias Walmsley 10/28/2012, 2:16 PM

## 2012-10-28 NOTE — Anesthesia Postprocedure Evaluation (Signed)
Anesthesia Post Note  Patient: Charles Jenkins  Procedure(s) Performed: Procedure(s) (LRB): MICRO LUMBAR DECOMPRESSION L4 - L5 and L5 - S1 2 LEVELS (Left)  Anesthesia type: General  Patient location: PACU  Post pain: Pain level controlled  Post assessment: Post-op Vital signs reviewed  Last Vitals:  Filed Vitals:   10/28/12 1153  BP: 143/61  Pulse: 86  Temp: 36.7 C  Resp: 16    Post vital signs: Reviewed  Level of consciousness: sedated  Complications: No apparent anesthesia complications

## 2012-10-28 NOTE — Preoperative (Signed)
Beta Blockers   Reason not to administer Beta Blockers:Not Applicable 

## 2012-10-28 NOTE — Care Management Note (Signed)
    Page 1 of 1   10/28/2012     3:09:24 PM   CARE MANAGEMENT NOTE 10/28/2012  Patient:  Charles Jenkins, Charles Jenkins   Account Number:  [id number]  Date Initiated:  10/28/2012  Documentation initiated by:  Colleen Can  Subjective/Objective Assessment:   dx HNP, stenosis; microlumbar decompression     Action/Plan:   CM spoke with patient and spouse. Plans are for pt to return to his home where spouse will be caregiver. There are no HH or DME needs.   Anticipated DC Date:  10/28/2012   Anticipated DC Plan:  HOME/SELF CARE      DC Planning Services  CM consult      Choice offered to / List presented to:             Status of service:  Completed, signed off Medicare Important Message given?   (If response is "NO", the following Medicare IM given date fields will be blank) Date Medicare IM given:   Date Additional Medicare IM given:    Discharge Disposition:    Per UR Regulation:  Reviewed for med. necessity/level of care/duration of stay  If discussed at Long Length of Stay Meetings, dates discussed:    Comments:

## 2012-10-28 NOTE — Transfer of Care (Signed)
Immediate Anesthesia Transfer of Care Note  Patient: Charles Jenkins  Procedure(s) Performed: Procedure(s): MICRO LUMBAR DECOMPRESSION L4 - L5 and L5 - S1 2 LEVELS (Left)  Patient Location: PACU  Anesthesia Type:General  Level of Consciousness: awake, alert , oriented and patient cooperative  Airway & Oxygen Therapy: Patient Spontanous Breathing and Patient connected to face mask oxygen  Post-op Assessment: Report given to PACU RN, Post -op Vital signs reviewed and stable and Patient moving all extremities  Post vital signs: Reviewed and stable  Complications: No apparent anesthesia complications

## 2012-10-28 NOTE — H&P (View-Only) (Signed)
Charles Jenkins is an 52 y.o. male.   Chief Complaint: back and left leg pain HPI: The patient is a 52 year old male being followed for their low back symptoms. They are now 16 weeks out from injury. Symptoms reported today include: pain (left buttock and leg), numbness (left buttock and leg) and leg pain (left). The patient states that they are doing poorly. Current treatment includes: relative rest, activity modification and pain medications. The following medication has been used for pain control: Percocet and Gabapentin. The patient presents today following SNRB left L5 x 3 weeks, 4 day. The patient reports the injection helped for 3 or 4 days. The patient is currently out of work. NCM Darrelyn Hillock.  [facility] follows up after his selective nerve root block. He had maybe three days relief from the injection. He is anesthetic in the L5 nerve root distribution according to the radiologist.   Past Medical History  Diagnosis Date  . Hypertension   . Back pain   . Depression   . Ankylosing spondylitis   . Chronic right SI joint pain     No past surgical history on file.  No family history on file. Social History:  reports that he has been smoking Cigars.  His smokeless tobacco use includes Snuff. His alcohol and drug histories are not on file.  Allergies: No Known Allergies   (Not in a hospital admission)  No results found for this or any previous visit (from the past 48 hour(s)). No results found.  Review of Systems  Constitutional: Negative.   HENT: Negative.   Eyes: Negative.   Respiratory: Negative.   Cardiovascular: Negative.   Gastrointestinal: Negative.   Genitourinary: Negative.   Musculoskeletal: Positive for back pain.  Skin: Negative.   Neurological: Negative.   Endo/Heme/Allergies: Negative.   Psychiatric/Behavioral: Negative.     There were no vitals taken for this visit. Physical Exam  Constitutional: He is oriented to person, place, and time. He appears  well-developed and well-nourished.  HENT:  Head: Normocephalic and atraumatic.  Eyes: Conjunctivae and EOM are normal. Pupils are equal, round, and reactive to light.  Neck: Normal range of motion. Neck supple.  Cardiovascular: Normal rate and regular rhythm.   Respiratory: Effort normal and breath sounds normal.  GI: Soft. Bowel sounds are normal.  Musculoskeletal:  He is in moderate distress. He walks with an antalgic gait. Mood and affect appropriate.  Lumbar spine exam reveals no evidence of soft tissue swelling, ecchymosis or deformity. The abdomen is soft and nontender. Nontender over the trochanters. No cellulitis or lymphadenopathy.  Straight leg raise produces buttock, thigh and calf pain on the left, negative on the right. He has EHL weakness on the left as compared to the right. Pain with extension. Motor is 5/5 including tibialis anterior, plantar flexion, quadriceps and hamstrings. He has diminished repetitive plantar flexion. There is no Babinski or clonus. Sensory exam is intact to light touch. The patient has good distal pulses. No DVT. No pain and normal range of motion without instability of the hips, knees and ankles.   Neurological: He is alert and oriented to person, place, and time. He has normal reflexes.  Skin: Skin is warm and dry.  Psychiatric: He has a normal mood and affect.    MRI demonstrates disk herniation at L5-S1 to the left with associated lateral recess stenosis. 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 Protrusion at L3-4.  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 Lateral recess stenosis at L4-5 and temporary relief from a diagnostic selective nerve root block at L5. Physical therapy has aggravated the symptoms.  Given the duration of his symptoms and the presence of a neurological deficit following a  diagnostic block and failure of conservative treatment we discussed living with his symptoms and pain management versus a lumbar decompression at L5-S1 and probably at L4-5 given his congenital stenosis and lateral recess stenosis.  I had an extensive discussion of the risks and benefits of the lumbar decompression with the patient including bleeding, infection, damage to neurovascular structures, epidural fibrosis, CSF leak requiring repair. We also discussed increase in pain, adjacent segment disease, recurrent disc herniation, need for future surgery including repeat decompression and/or fusion. We also discussed risks of postoperative hematoma, paralysis, anesthetic complications including DVT, PE, death, cardiopulmonary dysfunction. In addition, the perioperative and postoperative courses were discussed in detail including the rehabilitative time and return to functional activity and work. I provided the patient with an illustrated handout and utilized the appropriate surgical models.  He is out of work as no light duties are available. He has no history of cardiac disease, pulmonary disease, DVT or MRSA exposure. He does have coronary artery disease in his family. He would like to proceed with a surgical decompression. I appreciate input from his medical physician, Dr. Gerda Diss. I discussed this in front of the patient with Elaina Pattee, case manager. I reviewed his physical therapy note as well.  Plan microlumbar decompression L4-5, L5-S1  Mylena Sedberry M. for Dr. Shelle Iron 10/26/2012, 8:12 AM

## 2012-10-28 NOTE — Brief Op Note (Signed)
10/28/2012  10:58 AM  PATIENT:  Charles Jenkins  52 y.o. male  PRE-OPERATIVE DIAGNOSIS:  HNP and Stenosis  POST-OPERATIVE DIAGNOSIS:  HNP and Stenosis  PROCEDURE:  Procedure(s): MICRO LUMBAR DECOMPRESSION L4 - L5 and L5 - S1 2 LEVELS (N/A)  SURGEON:  Surgeon(s) and Role:    * Javier Docker, MD - Primary  PHYSICIAN ASSISTANT:   ASSISTANTS: [name]   ANESTHESIA:   general  EBL:  Total I/O In: 1000 [I.V.:1000] Out: 425 [Urine:250; Other:175]  BLOOD ADMINISTERED:none  DRAINS: none   LOCAL MEDICATIONS USED:  MARCAINE     SPECIMEN:  No Specimen  DISPOSITION OF SPECIMEN:  N/A  COUNTS:  YES  TOURNIQUET:  * No tourniquets in log *  DICTATION: .Other Dictation: Dictation Number [id number]  PLAN OF CARE: Admit for overnight observation  PATIENT DISPOSITION:  PACU - hemodynamically stable.   Delay start of Pharmacological VTE agent (>24hrs) due to surgical blood loss or risk of bleeding: yes

## 2012-10-28 NOTE — Progress Notes (Signed)
Spoke with pt's nurse Bonita Quin. Pt still planning for D/C today. Foley removed this morning, he is voiding without difficulty, pain is well controlled, has been OOB without difficulty. He is due for next dose of abx at 1700 and following that would like to be D/C'd home. Will place orders.

## 2012-10-29 ENCOUNTER — Other Ambulatory Visit: Payer: Self-pay | Admitting: Family Medicine

## 2012-10-29 ENCOUNTER — Encounter (HOSPITAL_COMMUNITY): Payer: Self-pay | Admitting: Specialist

## 2012-10-29 NOTE — Discharge Summary (Signed)
Physician Discharge Summary   Patient ID: Charles Jenkins MRN: [id number] DOB/AGE: 06/15/1960 52 y.o.  Admit date: 10/28/2012 Discharge date: 10/29/2012  Primary Diagnosis:   HNP and Stenosis  Admission Diagnoses:  Past Medical History  Diagnosis Date  . Hypertension   . Back pain   . Depression   . Ankylosing spondylitis   . Chronic right SI joint pain    Discharge Diagnoses:   Principal Problem:   Spinal stenosis of lumbar region  Procedure:  Procedure(s) (LRB): MICRO LUMBAR DECOMPRESSION L4 - L5 and L5 - S1 2 LEVELS (Left)   Consults: None  HPI:  see H&P    Laboratory Data: Hospital Outpatient Visit on 10/26/2012  Component Date Value Range Status  . MRSA, PCR 10/26/2012 NEGATIVE  NEGATIVE Final  . Staphylococcus aureus 10/26/2012 POSITIVE* NEGATIVE Final   Comment:                                 The Xpert SA Assay (FDA                          approved for NASAL specimens                          in patients over 55 years of age),                          is one component of                          a comprehensive surveillance                          program.  Test performance has                          been validated by [employer] for patients greater                          than or equal to 50 year old.                          It is not intended                          to diagnose infection nor to                          guide or monitor treatment.  . Sodium 10/26/2012 135  135 - 145 mEq/L Final  . Potassium 10/26/2012 4.1  3.5 - 5.1 mEq/L Final  . Chloride 10/26/2012 100  96 - 112 mEq/L Final  . CO2 10/26/2012 26  19 - 32 mEq/L Final  . Glucose, Bld 10/26/2012 108* 70 - 99 mg/dL Final  . BUN 40/98/1191 11  6 - 23 mg/dL Final  . Creatinine, Ser 10/26/2012 0.94  0.50 - 1.35 mg/dL Final  . Calcium 47/82/9562 9.4  8.4 - 10.5 mg/dL Final  . GFR  calc non Af Amer 10/26/2012 >90  >90 mL/min Final  . GFR calc Af Amer  10/26/2012 >90  >90 mL/min Final   Comment:                                 The eGFR has been calculated                          using the CKD EPI equation.                          This calculation has not been                          validated in all clinical                          situations.                          eGFR's persistently                          <90 mL/min signify                          possible Chronic Kidney Disease.  . WBC 10/26/2012 7.8  4.0 - 10.5 K/uL Final  . RBC 10/26/2012 4.89  4.22 - 5.81 MIL/uL Final  . Hemoglobin 10/26/2012 14.5  13.0 - 17.0 g/dL Final  . HCT 96/29/5284 44.1  39.0 - 52.0 % Final  . MCV 10/26/2012 90.2  78.0 - 100.0 fL Final  . MCH 10/26/2012 29.7  26.0 - 34.0 pg Final  . MCHC 10/26/2012 32.9  30.0 - 36.0 g/dL Final  . RDW 13/24/4010 13.0  11.5 - 15.5 % Final  . Platelets 10/26/2012 334  150 - 400 K/uL Final    Recent Labs  10/26/12 0900  HGB 14.5    Recent Labs  10/26/12 0900  WBC 7.8  RBC 4.89  HCT 44.1  PLT 334    Recent Labs  10/26/12 0900  NA 135  K 4.1  CL 100  CO2 26  BUN 11  CREATININE 0.94  GLUCOSE 108*  CALCIUM 9.4   No results found for this basename: LABPT, INR,  in the last 72 hours  X-Rays:Dg Lumbar Spine 2-3 Views  10/26/2012   *RADIOLOGY REPORT*  Clinical Data: Preop for lumbar decompression.  LUMBAR SPINE - 2-3 VIEW  Comparison: Lumbar spine radiographs 07/13/2012, 07/13/2010 and MRI lumbar spine 07/26/2010  Findings: Numbering of the lumbar spine is per the numbering on the MRI images from 07/26/2010.  There are five lumbar-type tubal bodies.  Lumbar spine vertebral bodies are normal in height and alignment.  There is disc space narrowing and anterior osteophyte formation at L1-L2.  Slight disc space narrowing at L5-S1.  Anterior osteophyte formation at L4 and L5.  There is facet joint hypertrophy most prominent at L4-L5.  No fracture is identified.  There is suggestion of bony bridging/partial  fusion of the inferior right sacroiliac joint, as described on radiographs of 07/13/2012.  IMPRESSION:  No acute bony abnormality.  Stable multilevel degenerative changes.  Suspect partial fusion of the right sacroiliac joint.   Original Report  Authenticated By: Britta Mccreedy, M.D.   Dg Spine Portable 1 View  10/28/2012   *RADIOLOGY REPORT*  Clinical Data: lumbar spine at the level localization.  PORTABLE SPINE - 1 VIEW  Comparison: Earlier today  Findings: Using the same numbering scheme as previously the film labeled portable #3 is annotated. There are tissue spreaders posterior to the L5-S1 disc space.  To surgical probes are present.  One probe is posterior to the inferior endplate of L5 and the other probe is posterior to the S1 vertebra.  IMPRESSION:  1.  Portable intraoperative radiograph was performed for lumbar spine level localization.   Original Report Authenticated By: Signa Kell, M.D.   Dg Spine Portable 1 View  10/28/2012   *RADIOLOGY REPORT*  Clinical Data: Surgical spine labeling  PORTABLE SPINE - 1 VIEW  Comparison: Earlier today  Findings: Portable radiograph labeled #2 is submitted for labeling. There are tissue spreaders posterior to the L5-S1 disc space. There is a surgical probe which is directed towards the L5 S1 disc space.  A second probe is identified posterior to the sacrum.  IMPRESSION:  1. Surgical probe localizes the L5-S1 disc space.   Original Report Authenticated By: Signa Kell, M.D.   Dg Spine Portable 1 View  10/28/2012   *RADIOLOGY REPORT*  Clinical Data:   Evaluate lumbar spine levels.  PORTABLE SPINE - 1 VIEW  Comparison: 10/26/2012  Findings: Portable intraoperative radiograph labeled #1 is submitted for annotation.  Using the same numbering scheme as the previous examination the lumbar spine levels were annotated. There is a surgical probe posterior to the L3 vertebra and the second probe posterior to the L4-5 disc space.  IMPRESSION:  1.  Portable intraoperative  radiograph for spine level localization.   Original Report Authenticated By: Signa Kell, M.D.    EKG:No orders found for this or any previous visit.   Hospital Course: Patient was admitted to [facility] and taken to the OR and underwent the above state procedure without complications.  Patient tolerated the procedure well and was later transferred to the recovery room and then to the orthopaedic floor for postoperative care.  They were given PO and IV analgesics for pain control following their surgery.  They were given 2 doses of postoperative antibiotics.   PT was consulted postop to assist with mobility and transfers.  The patient was allowed to be WBAT with therapy and was taught back precautions. Discharge planning was consulted to help with postop disposition and equipment needs. Pain was well controlled and pt was voiding without difficulty, therefore was discharged same day of surgery. They were given discharge instructions and dressing directions.  They were instructed on when to follow up in the office with Dr. Shelle Iron.  Discharge Medications: Prior to Admission medications   Medication Sig Start Date End Date Taking? Authorizing Provider  adalimumab (HUMIRA) 40 MG/0.8ML injection Inject 40 mg into the skin every 14 (fourteen) days.     Historical Provider, MD  ALPRAZolam Prudy Feeler) 0.5 MG tablet Take 0.5 mg by mouth 3 (three) times daily as needed for anxiety.    Historical Provider, MD  betamethasone dipropionate (DIPROLENE) 0.05 % ointment Apply 1 application topically 2 (two) times daily. Applies to feet.    Historical Provider, MD  cephALEXin (KEFLEX) 750 MG capsule Take 1 capsule (750 mg total) by mouth 4 (four) times daily. 10/28/12   Dayna Barker. [name], PA-C  citalopram (CELEXA) 40 MG tablet Take 40 mg by mouth at bedtime.  Historical Provider, MD  methocarbamol (ROBAXIN) 500 MG tablet Take 1 tablet (500 mg total) by mouth 4 (four) times daily. 10/28/12   Dayna Barker. [name],  PA-C  oxyCODONE-acetaminophen (PERCOCET) 10-325 MG per tablet Take 1-2 tablets by mouth every 4 (four) hours as needed for pain. 10/28/12   Dayna Barker. Christene Lye, PA-C    Diet: Regular diet Activity:WBAT Follow-up:in 10-14 days Disposition - Home Discharged Condition: good   Discharge Orders   Future Appointments Provider Department Dept Phone   11/20/2012 9:30 AM Merlyn Albert, MD [facility] [phone]   Future Orders Complete By Expires     Call MD / Call 911  As directed     Comments:      If you experience chest pain or shortness of breath, CALL 911 and be transported to the hospital emergency room.  If you develope a fever above 101 F, pus (white drainage) or increased drainage or redness at the wound, or calf pain, call your surgeon's office.    Constipation Prevention  As directed     Comments:      Drink plenty of fluids.  Prune juice may be helpful.  You may use a stool softener, such as Colace (over the counter) 100 mg twice a day.  Use MiraLax (over the counter) for constipation as needed.    Diet - low sodium heart healthy  As directed     Increase activity slowly as tolerated  As directed         Medication List    STOP taking these medications       azithromycin 250 MG tablet  Commonly known as:  ZITHROMAX Z-PAK      TAKE these medications       ALPRAZolam 0.5 MG tablet  Commonly known as:  XANAX  Take 0.5 mg by mouth 3 (three) times daily as needed for anxiety.     betamethasone dipropionate 0.05 % ointment  Commonly known as:  DIPROLENE  Apply 1 application topically 2 (two) times daily. Applies to feet.     cephALEXin 750 MG capsule  Commonly known as:  KEFLEX  Take 1 capsule (750 mg total) by mouth 4 (four) times daily.     citalopram 40 MG tablet  Commonly known as:  CELEXA  Take 40 mg by mouth at bedtime.     HUMIRA 40 MG/0.8ML injection  Generic drug:  adalimumab  Inject 40 mg into the skin every 14 (fourteen) days.      methocarbamol 500 MG tablet  Commonly known as:  ROBAXIN  Take 1 tablet (500 mg total) by mouth 4 (four) times daily.     oxyCODONE-acetaminophen 10-325 MG per tablet  Commonly known as:  PERCOCET  Take 1-2 tablets by mouth every 4 (four) hours as needed for pain.           Follow-up Information   Follow up with BEANE,JEFFREY C, MD In 2 weeks.   Contact information:   [address] [place] [address] [phone]       Signed: Dorothy Spark. 10/29/2012, 7:19 AM

## 2012-10-29 NOTE — Op Note (Signed)
NAMEBERLIE, HATCHEL               ACCOUNT NO.:  [id number]  MEDICAL RECORD NO.:  [id number]  LOCATION:  1603                         FACILITY:  [facility]  PHYSICIAN:  Jene Every, M.D.    DATE OF BIRTH:  02-26-1961  DATE OF PROCEDURE:  10/28/2012 DATE OF DISCHARGE:  10/28/2012                              OPERATIVE REPORT   PREOPERATIVE DIAGNOSES:  Spinal stenosis, herniated nucleus pulposus, L5- S1, L4-L5 to morbid obesity with a body mass index of 45.  POSTOPERATIVE DIAGNOSES:  Spinal stenosis, herniated nucleus pulposus, L5-S1, L4-L5 to morbid obesity with a body mass index of 45.  PROCEDURE PERFORMED: 1. Microlumbar decompression, L5-S1, left. 2. Foraminotomies at L5 and S1. 3. Micro lumbar decompression, L4-L5 with microdiscectomy at L5-S1,     left.  ANESTHESIA:  General.  ASSISTANT:  Lanna Poche, PA.  HISTORY:  This is a 52 year old with left lower extremity radicular pain, L5-S1 nerve root distribution, neural tension signs, EHL weakness, temporarily relief from epidural at L5-S1.  MRI indicating disk herniation at L5-S1 to the right.  Also lateral recess stenosis at L4-L5 given the L5 and S1 nerve root dermatomal dysesthesias.  We discussed decompression predominantly at L5-S1 also at L4-L5 due to lateral recess and compressing the L5 root.  Risks and benefits were discussed including bleeding, infection, damage to neurovascular structures, DVT, PE, anesthetic complications, etc.  The patient's degree of difficulty elevated due to the patient's elevated BMI.  TECHNIQUE:  With the patient in the supine position, after induction of adequate anesthesia with 3 g Kefzol, was placed prone on the [place] frame.  All bony prominences were well padded.  An effortful time was taken to position the patient due to the size.  Lumbar region was prepped and draped in the usual sterile fashion.  Two 18-gauge spinal needle was utilized to localize L4-L5, and L5-S1  interspace, confirmed with x-ray.  Incision was made from the spinous process of below L4-S1. Subcutaneous tissue was dissected with cautery to achieve hemostasis. He had fairly ample subcutaneous adipose tissue.  The dorsolumbar fascia was divided in line with skin incision.  Paraspinous muscles were elevated from lamina of L4-L5 and L5S1.  McCullough retractor was placed.  Operating microscope was draped, brought into the surgical field.  Confirmed by x-ray at L5-S1.  We then noted to have the depth of the patient slightly exceeded the retractor length.  Detached the ligamentum flavum from the cephalad edge of the S1 utilizing straight curette.  Ligamentum flavum was removed from the interspace.  The S1 nerve root was compressed in the lateral recess due to facet hypertrophy and disk protrusion.  I performed a foraminotomy of S1, identified and gently mobilized the S1 nerve root medially.  Decompressed the lateral recess to the medial border pedicle.  Performed a foraminotomy of L5. Epidural venous plexus was noted and bipolar electrocautery was utilized to achieve hemostasis.  He had a fairly calcified portion of the disk, but an inferior portion of it, there was a small rent, a small protrusion.  This was removed.  Copiously irrigated the wound.  We obtained a confirmatory radiograph with 1 cm excursion of the S1 nerve root medial to pedicle  without tension.  A Woodson retractor placed freely up the foramen of L5 and S1.  In the similar fashion, we decompressed the L4-L5 removing the [facility] retractor.  We performed hemilaminotomies of the caudad edge of L4, cephalad edge of L5, removing the ligamentum flavum, hypertrophic facet and ligamentum flavum was noted.  Performed a foraminotomy of L5.  Protected the L5 root. Decompressed the lateral recess to the medial border of the pedicle.  No disk herniation was noted here.  I felt this was adequately decompressed.  It was copiously  irrigated.  No evidence of CSF leakage or active bleeding.  Placed thrombin-soaked Gelfoam in the laminotomy defect.  Removed the [facility] retractor.  No paraspinous bleeding was noted.  The dorsolumbar fascia with #1 Vicryl interrupted figure-of- eight sutures.  Subcu with multiple layers of 2-0.  Skin with staples. Wound was dressed sterilely.  Placed supine on hospital bed, extubated without difficulty, and transported to the recovery in satisfactory condition.  The patient tolerated the procedure well.  No complications.  Assistant was Lanna Poche, [place].     Jene Every, M.D.     Cordelia Pen  D:  10/28/2012  T:  10/29/2012  Job:  [id number]

## 2012-11-20 ENCOUNTER — Encounter: Payer: Self-pay | Admitting: Family Medicine

## 2012-11-20 ENCOUNTER — Ambulatory Visit (INDEPENDENT_AMBULATORY_CARE_PROVIDER_SITE_OTHER): Payer: PRIVATE HEALTH INSURANCE | Admitting: Family Medicine

## 2012-11-20 VITALS — BP 134/92 | HR 70 | Wt 334.0 lb

## 2012-11-20 DIAGNOSIS — M461 Sacroiliitis, not elsewhere classified: Secondary | ICD-10-CM

## 2012-11-20 NOTE — Progress Notes (Signed)
  Subjective:    Patient ID: Charles Jenkins, male    DOB: 1961/02/23, 52 y.o.   MRN: [id number]  HPI  Persistent back pain. s i joint tenderness and pain has acted up again. Sciatica definetely better.  Do to resume Humira soon.  Not back to work yet. Copious and he backed work soon.  Feels he needs to get back on his 4 times a day hydrocodone and stop his Percocet  Review of Systems No vomiting no diarrhea no chest pain no abdominal pain ROS otherwise negative    Objective:   Physical Exam  Alert mild distress with exam lungs clear. Heart regular rate and rhythm. Right SI region tender to palpation. Plus minus straight leg raise but improved      Assessment & Plan:  Impression status post disc surgery with resurgence of chronic SI joint pain. Plan hydrocodone resumed and refilled. Recheck every several months. Diet exercise discussed. WSL

## 2013-03-10 ENCOUNTER — Encounter: Payer: Self-pay | Admitting: Family Medicine

## 2013-03-10 ENCOUNTER — Ambulatory Visit (INDEPENDENT_AMBULATORY_CARE_PROVIDER_SITE_OTHER): Payer: PRIVATE HEALTH INSURANCE | Admitting: Family Medicine

## 2013-03-10 VITALS — BP 150/90 | Ht 71.0 in | Wt 323.0 lb

## 2013-03-10 DIAGNOSIS — G4700 Insomnia, unspecified: Secondary | ICD-10-CM

## 2013-03-10 DIAGNOSIS — F329 Major depressive disorder, single episode, unspecified: Secondary | ICD-10-CM

## 2013-03-10 DIAGNOSIS — M461 Sacroiliitis, not elsewhere classified: Secondary | ICD-10-CM

## 2013-03-10 DIAGNOSIS — M5430 Sciatica, unspecified side: Secondary | ICD-10-CM

## 2013-03-10 DIAGNOSIS — I10 Essential (primary) hypertension: Secondary | ICD-10-CM

## 2013-03-10 MED ORDER — ALPRAZOLAM 0.5 MG PO TABS: 0.5000 mg | ORAL_TABLET | Freq: Three times a day (TID) | ORAL | Status: DC | PRN

## 2013-03-10 MED ORDER — HYDROCODONE-ACETAMINOPHEN 10-325 MG PO TABS: 1.0000 | ORAL_TABLET | Freq: Four times a day (QID) | ORAL | Status: DC | PRN

## 2013-03-10 MED ORDER — CITALOPRAM HYDROBROMIDE 40 MG PO TABS: ORAL_TABLET | ORAL | Status: DC

## 2013-03-10 NOTE — Progress Notes (Signed)
  Subjective:    Patient ID: Charles Jenkins, male    DOB: [birth year], 52 y.o.   MRN: [id number]  HPI Patient is here today for a follow up visit on his back pain. He needs refills on his medications. He states he has no concerns at this time.   Doing a lot of walking. Followed by multiple specialists.  Recently had steroid injec.  Definitely compliant with the hydrocdone, states needs it for sure/ no constipation.   Patient states insomnia as overall stable as long as he takes his medications. Also reports that the alprazolam is helping his anxiety.  Reports that his somewhat depressed mood has improved on the medication. Would like to stay on the Celexa.  Ongoing significant low back pain with radiation into the legs from his neuropathic pain. Ongoing significant back pain from the sacroiliitis.  Review of Systems No chest pain no abdominal pain no change urinary or bowel habits no constipation ROS otherwise negative    Objective:   Physical Exam  Blood pressure initially elevated improved on repeat 138/84. Lungs clear heart regular in rhythm. Obesity noted. Low back tender to palpation. 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 #3 hypertension control without medicine currently. #4 obesity discussed. #5 insomnia stable. #6 anxiety with element of depression stable. Plan diet exercise discussed. Recheck in several months. Wellness exam then. WSL

## 2013-05-19 ENCOUNTER — Other Ambulatory Visit: Payer: Self-pay | Admitting: Specialist

## 2013-05-19 DIAGNOSIS — M549 Dorsalgia, unspecified: Secondary | ICD-10-CM

## 2013-06-02 ENCOUNTER — Ambulatory Visit
Admission: RE | Admit: 2013-06-02 | Discharge: 2013-06-02 | Disposition: A | Discharge status: Home / Self Care | Payer: Self-pay | Source: Ambulatory Visit | Attending: Specialist | Admitting: Specialist

## 2013-06-02 ENCOUNTER — Other Ambulatory Visit: Payer: Self-pay | Admitting: Specialist

## 2013-06-02 ENCOUNTER — Ambulatory Visit
Admission: RE | Admit: 2013-06-02 | Discharge: 2013-06-02 | Disposition: A | Discharge status: Home / Self Care | Payer: Worker's Compensation | Source: Ambulatory Visit | Attending: Specialist | Admitting: Specialist

## 2013-06-02 VITALS — BP 127/74 | HR 82

## 2013-06-02 DIAGNOSIS — M48061 Spinal stenosis, lumbar region without neurogenic claudication: Secondary | ICD-10-CM

## 2013-06-02 DIAGNOSIS — M549 Dorsalgia, unspecified: Secondary | ICD-10-CM

## 2013-06-02 DIAGNOSIS — M461 Sacroiliitis, not elsewhere classified: Secondary | ICD-10-CM

## 2013-06-02 DIAGNOSIS — M5430 Sciatica, unspecified side: Secondary | ICD-10-CM

## 2013-06-02 MED ORDER — IOHEXOL 180 MG/ML  SOLN
18.0000 mL | Freq: Once | INTRAMUSCULAR | Status: AC | PRN
  Administered 2013-06-02: 18 mL via INTRATHECAL

## 2013-06-02 MED ORDER — DIAZEPAM 5 MG PO TABS
10.0000 mg | ORAL_TABLET | Freq: Once | ORAL | Status: AC
  Administered 2013-06-02: 10 mg via ORAL

## 2013-06-02 MED ORDER — MEPERIDINE HCL 100 MG/ML IJ SOLN
100.0000 mg | Freq: Once | INTRAMUSCULAR | Status: AC
  Administered 2013-06-02: 100 mg via INTRAMUSCULAR

## 2013-06-02 MED ORDER — ONDANSETRON HCL 4 MG/2ML IJ SOLN
4.0000 mg | Freq: Once | INTRAMUSCULAR | Status: AC
  Administered 2013-06-02: 4 mg via INTRAMUSCULAR

## 2013-06-02 NOTE — Progress Notes (Signed)
Pt states he has been off Celexa for the past 2 days. Discharge instructions explained to pt.  jkl rn

## 2013-06-02 NOTE — Discharge Instructions (Addendum)
Myelogram Discharge Instructions  1. Go home and rest quietly for the next 24 hours.  It is important to lie flat for the next 24 hours.  Get up only to go to the restroom.  You may lie in the bed or on a couch on your back, your stomach, your left side or your right side.  You may have one pillow under your head.  You may have pillows between your knees while you are on your side or under your knees while you are on your back.  2. DO NOT drive today.  Recline the seat as far back as it will go, while still wearing your seat belt, on the way home.  3. You may get up to go to the bathroom as needed.  You may sit up for 10 minutes to eat.  You may resume your normal diet and medications unless otherwise indicated.  Drink lots of extra fluids today and tomorrow.  4. The incidence of headache, nausea, or vomiting is about 5% (one in 20 patients).  If you develop a headache, lie flat and drink plenty of fluids until the headache goes away.  Caffeinated beverages may be helpful.  If you develop severe nausea and vomiting or a headache that does not go away with flat bed rest, call [phone].  5. You may resume normal activities after your 24 hours of bed rest is over; however, do not exert yourself strongly or do any heavy lifting tomorrow. If when you get up you have a headache when standing, go back to bed and force fluids for another 24 hours.  6. Call your physician for a follow-up appointment.  The results of your myelogram will be sent directly to your physician by the following day.  7. If you have any questions or if complications develop after you arrive home, please call [phone].  Discharge instructions have been explained to the patient.  The patient, or the person responsible for the patient, fully understands these instructions.    You may resume Celexa on Thursday, June 03, 2013 after 9:30a.m.

## 2013-06-10 ENCOUNTER — Ambulatory Visit: Payer: PRIVATE HEALTH INSURANCE | Admitting: Family Medicine

## 2013-11-12 ENCOUNTER — Ambulatory Visit (HOSPITAL_BASED_OUTPATIENT_CLINIC_OR_DEPARTMENT_OTHER): Payer: PRIVATE HEALTH INSURANCE | Admitting: Internal Medicine

## 2013-11-12 ENCOUNTER — Encounter: Payer: Self-pay | Admitting: Internal Medicine

## 2013-11-12 ENCOUNTER — Other Ambulatory Visit: Payer: Self-pay | Admitting: Internal Medicine

## 2013-11-12 ENCOUNTER — Ambulatory Visit: Payer: PRIVATE HEALTH INSURANCE

## 2013-11-12 ENCOUNTER — Other Ambulatory Visit (HOSPITAL_BASED_OUTPATIENT_CLINIC_OR_DEPARTMENT_OTHER): Payer: PRIVATE HEALTH INSURANCE

## 2013-11-12 VITALS — BP 148/88 | HR 75 | Temp 98.5°F | Resp 18 | Ht 71.0 in | Wt 342.5 lb

## 2013-11-12 DIAGNOSIS — R0609 Other forms of dyspnea: Secondary | ICD-10-CM

## 2013-11-12 DIAGNOSIS — R0989 Other specified symptoms and signs involving the circulatory and respiratory systems: Secondary | ICD-10-CM

## 2013-11-12 DIAGNOSIS — R404 Transient alteration of awareness: Secondary | ICD-10-CM

## 2013-11-12 DIAGNOSIS — D892 Hypergammaglobulinemia, unspecified: Secondary | ICD-10-CM | POA: Insufficient documentation

## 2013-11-12 DIAGNOSIS — D472 Monoclonal gammopathy: Secondary | ICD-10-CM

## 2013-11-12 DIAGNOSIS — R894 Abnormal immunological findings in specimens from other organs, systems and tissues: Secondary | ICD-10-CM

## 2013-11-12 DIAGNOSIS — M459 Ankylosing spondylitis of unspecified sites in spine: Secondary | ICD-10-CM

## 2013-11-12 LAB — COMPREHENSIVE METABOLIC PANEL (CC13)
ALK PHOS: 79 U/L (ref 40–150)
ALT: 42 U/L (ref 0–55)
AST: 28 U/L (ref 5–34)
Albumin: 3.4 g/dL — ABNORMAL LOW (ref 3.5–5.0)
Anion Gap: 10 mEq/L (ref 3–11)
BILIRUBIN TOTAL: 0.38 mg/dL (ref 0.20–1.20)
BUN: 11.6 mg/dL (ref 7.0–26.0)
CO2: 23 mEq/L (ref 22–29)
CREATININE: 0.9 mg/dL (ref 0.7–1.3)
Calcium: 9.3 mg/dL (ref 8.4–10.4)
Chloride: 106 mEq/L (ref 98–109)
Glucose: 107 mg/dl (ref 70–140)
Potassium: 4.2 mEq/L (ref 3.5–5.1)
SODIUM: 139 meq/L (ref 136–145)
TOTAL PROTEIN: 7.8 g/dL (ref 6.4–8.3)

## 2013-11-12 LAB — CBC WITH DIFFERENTIAL/PLATELET
BASO%: 1.2 % (ref 0.0–2.0)
Basophils Absolute: 0.1 10*3/uL (ref 0.0–0.1)
EOS%: 3.5 % (ref 0.0–7.0)
Eosinophils Absolute: 0.3 10*3/uL (ref 0.0–0.5)
HCT: 45.2 % (ref 38.4–49.9)
HGB: 14.6 g/dL (ref 13.0–17.1)
LYMPH%: 19.1 % (ref 14.0–49.0)
MCH: 29.5 pg (ref 27.2–33.4)
MCHC: 32.4 g/dL (ref 32.0–36.0)
MCV: 91.1 fL (ref 79.3–98.0)
MONO#: 1 10*3/uL — AB (ref 0.1–0.9)
MONO%: 12.9 % (ref 0.0–14.0)
NEUT#: 4.8 10*3/uL (ref 1.5–6.5)
NEUT%: 63.3 % (ref 39.0–75.0)
PLATELETS: 264 10*3/uL (ref 140–400)
RBC: 4.96 10*6/uL (ref 4.20–5.82)
RDW: 13.5 % (ref 11.0–14.6)
WBC: 7.6 10*3/uL (ref 4.0–10.3)
lymph#: 1.4 10*3/uL (ref 0.9–3.3)

## 2013-11-12 LAB — LACTATE DEHYDROGENASE (CC13): LDH: 173 U/L (ref 125–245)

## 2013-11-12 NOTE — Patient Instructions (Signed)
Infliximab injection °What is this medicine? °INFLIXIMAB (in FLIX i mab) is used to treat Crohn's disease and ulcerative colitis. It is also used to treat ankylosing spondylitis, psoriasis, and some forms of arthritis. °This medicine may be used for other purposes; ask your health care provider or pharmacist if you have questions. °COMMON BRAND NAME(S): Remicade °What should I tell my health care provider before I take this medicine? °They need to know if you have any of these conditions: °-diabetes °-exposure to tuberculosis °-heart failure °-hepatitis or liver disease °-immune system problems °-infection °-lung or breathing disease, like COPD °-multiple sclerosis °-current or past resident of [place] °-seizure disorder °-an unusual or allergic reaction to infliximab, mouse proteins, other medicines, foods, dyes, or preservatives °-pregnant or trying to get pregnant °-breast-feeding °How should I use this medicine? °This medicine is for injection into a vein. It is usually given by a health care professional in a hospital or clinic setting. °A special MedGuide will be given to you by the pharmacist with each prescription and refill. Be sure to read this information carefully each time. °Talk to your pediatrician regarding the use of this medicine in children. Special care may be needed. °Overdosage: If you think you have taken too much of this medicine contact a poison control center or emergency room at once. °NOTE: This medicine is only for you. Do not share this medicine with others. °What if I miss a dose? °It is important not to miss your dose. Call your doctor or health care professional if you are unable to keep an appointment. °What may interact with this medicine? °Do not take this medicine with any of the following medications: °-anakinra °-rilonacept °This medicine may also interact with the following medications: °-vaccines °This list may not describe all possible interactions.  Give your health care provider a list of all the medicines, herbs, non-prescription drugs, or dietary supplements you use. Also tell them if you smoke, drink alcohol, or use illegal drugs. Some items may interact with your medicine. °What should I watch for while using this medicine? °Visit your doctor or health care professional for regular checks on your progress. °If you get a cold or other infection while receiving this medicine, call your doctor or health care professional. Do not treat yourself. This medicine may decrease your body's ability to fight infections. Before beginning therapy, your doctor may do a test to see if you have been exposed to tuberculosis. °This medicine may make the symptoms of heart failure worse in some patients. If you notice symptoms such as increased shortness of breath or swelling of the ankles or legs, contact your health care provider right away. °If you are going to have surgery or dental work, tell your health care professional or dentist that you have received this medicine. °If you take this medicine for plaque psoriasis, stay out of the sun. If you cannot avoid being in the sun, wear protective clothing and use sunscreen. Do not use sun lamps or tanning beds/booths. °What side effects may I notice from receiving this medicine? °Side effects that you should report to your doctor or health care professional as soon as possible: °-allergic reactions like skin rash, itching or hives, swelling of the face, lips, or tongue °-chest pain °-fever or chills, usually related to the infusion °-muscle or joint pain °-red, scaly patches or raised bumps on the skin °-signs of infection - fever or chills, cough, sore throat, pain or difficulty passing urine °-swollen lymph nodes   in the neck, underarm, or groin areas °-unexplained weight loss °-unusual bleeding or bruising °-unusually weak or tired °-yellowing of the eyes or skin °Side effects that usually do not require medical attention  (report to your doctor or health care professional if they continue or are bothersome): °-headache °-heartburn or stomach pain °-nausea, vomiting °This list may not describe all possible side effects. Call your doctor for medical advice about side effects. You may report side effects to FDA at 1-800-FDA-1088. °Where should I keep my medicine? °This drug is given in a hospital or clinic and will not be stored at home. °NOTE: This sheet is a summary. It may not cover all possible information. If you have questions about this medicine, talk to your doctor, pharmacist, or health care provider. °© 2015, [employer]vier/[employer]. (2007-12-02 10:26:02) ° °

## 2013-11-12 NOTE — Progress Notes (Signed)
[place] [phone]   Fax:([phone]  NEW PATIENT EVALUATION   Name: Charles Jenkins Date: 11/12/2013 MRN: [id number] DOB: April 20, 1961  PCP: Mikey Kirschner, MD REFERRING PHYSICIAN: Bo Merino, MD REASON FOR REFERRAL:  Elevated immunoglobins   HISTORY OF PRESENT ILLNESS:Charles Jenkins is a 53 y.o. male who is being referred by his rheumatologist, Dr. Estanislado Pandy for further evaluation of elevated immunoglobulins.  He was last seen by Dr.  Arlean Hopping office on Sep 22, 2013.  Patient has a history of ankylosing spondyloarthropathy for he is on humira.  Humira was restarted January 2015.  He had back surgery on July, 2014 and it was held. He has plans to start remicade and now is back off of humira.  He was receiving injections in the back prior to his back surgery.  He is undergoing physical therapy. Today, he is accompanied by his Charles Jenkins.   He reports that he is feeling well overall.  He has not had a screening colonoscopy. His mother has sclerodema and is on treatment. He dips tobacco for the past twenty years.  His weight is stable but gained about 50 lbs over the past one year.  He attributes this to inactvity.    PAST MEDICAL HISTORY:  has a past medical history of Hypertension; Back pain; Depression; Ankylosing spondylitis; and Chronic right SI joint pain.     PAST SURGICAL HISTORY: Past Surgical History  Procedure Laterality Date  . Lumbar laminectomy/decompression microdiscectomy Left 10/28/2012    Procedure: MICRO LUMBAR DECOMPRESSION L4 - L5 and L5 - S1 2 LEVELS;  Surgeon: Johnn Hai, MD;  Location: [facility];  Service: Orthopedics;  Laterality: Left;     CURRENT MEDICATIONS: has a current medication list which includes the following prescription(s): folic acid, gabapentin, methotrexate, and oxycodone-acetaminophen.   ALLERGIES: Review of patient's allergies indicates no known allergies.   SOCIAL HISTORY:  reports  that he has been smoking Cigars.  His smokeless tobacco use includes Snuff. He reports that he drinks alcohol. He reports that he does not use illicit drugs.   FAMILY HISTORY: His mother has sclerodema and is on treatment. Otherwise, no family history of blood disorders or cancers.   LABORATORY DATA:  Results for orders placed in visit on 11/12/13 (from the past 48 hour(s))  CBC WITH DIFFERENTIAL     Status: Abnormal   Collection Time    11/12/13 10:46 AM      Result Value Ref Range   WBC 7.6  4.0 - 10.3 10e3/uL   NEUT# 4.8  1.5 - 6.5 10e3/uL   HGB 14.6  13.0 - 17.1 g/dL   HCT 45.2  38.4 - 49.9 %   Platelets 264  140 - 400 10e3/uL   MCV 91.1  79.3 - 98.0 fL   MCH 29.5  27.2 - 33.4 pg   MCHC 32.4  32.0 - 36.0 g/dL   RBC 4.96  4.20 - 5.82 10e6/uL   RDW 13.5  11.0 - 14.6 %   lymph# 1.4  0.9 - 3.3 10e3/uL   MONO# 1.0 (*) 0.1 - 0.9 10e3/uL   Eosinophils Absolute 0.3  0.0 - 0.5 10e3/uL   Basophils Absolute 0.1  0.0 - 0.1 10e3/uL   NEUT% 63.3  39.0 - 75.0 %   LYMPH% 19.1  14.0 - 49.0 %   MONO% 12.9  0.0 - 14.0 %   EOS% 3.5  0.0 - 7.0 %   BASO% 1.2  0.0 - 2.0 %  CMP     Component Value Date/Time   NA 139 11/12/2013 1047   NA 135 10/26/2012 0900   K 4.2 11/12/2013 1047   K 4.1 10/26/2012 0900   CL 100 10/26/2012 0900   CO2 23 11/12/2013 1047   CO2 26 10/26/2012 0900   GLUCOSE 107 11/12/2013 1047   GLUCOSE 108* 10/26/2012 0900   BUN 11.6 11/12/2013 1047   BUN 11 10/26/2012 0900   CREATININE 0.9 11/12/2013 1047   CREATININE 0.94 10/26/2012 0900   CALCIUM 9.3 11/12/2013 1047   CALCIUM 9.4 10/26/2012 0900   PROT 7.8 11/12/2013 1047   ALBUMIN 3.4* 11/12/2013 1047   AST 28 11/12/2013 1047   ALT 42 11/12/2013 1047   ALKPHOS 79 11/12/2013 1047   BILITOT 0.38 11/12/2013 1047   GFRNONAA >90 10/26/2012 0900   GFRAA >90 10/26/2012 0900    RADIOGRAPHY: No results found.     REVIEW OF SYSTEMS:  Constitutional: Denies fevers, chills or abnormal weight loss; weight gain as noted in HPI.  Eyes:  Denies blurriness of vision Ears, nose, mouth, throat, and face: Denies mucositis or sore throat Respiratory: Denies cough, dyspnea or wheezes Cardiovascular: Denies palpitation, chest discomfort or lower extremity swelling Gastrointestinal:  Denies nausea, heartburn or change in bowel habits Skin: Denies abnormal skin rashes Lymphatics: Denies new lymphadenopathy or easy bruising Neurological:Denies numbness, tingling or new weaknesses Behavioral/Psych: Mood is stable, no new changes  All other systems were reviewed with the patient and are negative.  PHYSICAL EXAM:  height is 5\' 11"  (1.803 m) and weight is 342 lb 8 oz (155.357 kg). His oral temperature is 98.5 F (36.9 C). His blood pressure is 148/88 and his pulse is 75. His respiration is 18.    GENERAL:alert, no distress and comfortable; morbidly obese.  SKIN: skin color, texture, turgor are normal, no rashes or significant lesions EYES: normal, Conjunctiva are pink and non-injected, sclera clear OROPHARYNX:no exudate, no erythema and lips, buccal mucosa, and tongue normal  NECK: supple, thyroid normal size, non-tender, without nodularity LYMPH:  no palpable lymphadenopathy in the cervical, axillary or inguinal LUNGS: clear to auscultation and percussion with normal breathing effort HEART: regular rate & rhythm and no murmurs and no lower extremity edema ABDOMEN:abdomen soft, non-tender and normal bowel sounds, large pannus.  Musculoskeletal:no cyanosis of digits and no clubbing  NEURO: alert & oriented x 3 with fluent speech, no focal motor/sensory deficits   IMPRESSION: Charles Jenkins is a 53 y.o. male with a history of   PLAN:  1.  Elevated immunoglobins. --We ordered an SPEP plus IFE, UPEP plus IFE, kappa lambda light chains as noted above to rule out MGUS.  He has normal calcium (9.3), creatinine (0.9) and hemoglobin (14.6).  We will discuss the results of this workup in 2 weeks. 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   2. Ankylosing spondylitis complicated by chronic back pain.  --Patient counseled to continue close follow up with rheumatology.  He requested additional information on remicade and we provided him a summary handout at his request. He is off humara.   3. Morbid obesity. --Patient reports continued dieting and exercise as tolerated.  He states weight gain has been attributed to post surgery immobilization.    4. Daytime somnolence plus loud snoring. --Patient is at high risk of OSA given wife reports loud snoring, and PE. He requests referral for sleep study prn.   5. Follow up. --Patient will follow up in 2 weeks with discussion of  his labs as noted above.   All questions were answered. The patient knows to call the clinic with any problems, questions or concerns. We can certainly see the patient much sooner if necessary.  I spent 30 minutes counseling the patient face to face. The total time spent in the appointment was 45 minutes.    Shadow Schedler, MD 11/12/2013 11:55 AM

## 2013-11-12 NOTE — Progress Notes (Signed)
Checked in new patient with no financial issues. He has appt card and has not been out of the country.

## 2013-11-15 LAB — KAPPA/LAMBDA LIGHT CHAINS
KAPPA LAMBDA RATIO: 1.69 — AB (ref 0.26–1.65)
Kappa free light chain: 2.7 mg/dL — ABNORMAL HIGH (ref 0.33–1.94)
Lambda Free Lght Chn: 1.6 mg/dL (ref 0.57–2.63)

## 2013-11-16 LAB — SPEP & IFE WITH QIG
Albumin ELP: 49.3 % — ABNORMAL LOW (ref 55.8–66.1)
Alpha-1-Globulin: 4.7 % (ref 2.9–4.9)
Alpha-2-Globulin: 10.5 % (ref 7.1–11.8)
Beta 2: 8.5 % — ABNORMAL HIGH (ref 3.2–6.5)
Beta Globulin: 7.3 % — ABNORMAL HIGH (ref 4.7–7.2)
GAMMA GLOBULIN: 19.7 % — AB (ref 11.1–18.8)
IGA: 621 mg/dL — AB (ref 68–379)
IGG (IMMUNOGLOBIN G), SERUM: 1470 mg/dL (ref 650–1600)
IgM, Serum: 64 mg/dL (ref 41–251)
TOTAL PROTEIN, SERUM ELECTROPHOR: 7.3 g/dL (ref 6.0–8.3)

## 2013-11-17 LAB — UIFE/LIGHT CHAINS/TP QN, 24-HR UR
Albumin, U: DETECTED
Free Kappa Lt Chains,Ur: 0.17 mg/dL (ref 0.14–2.42)
Free Kappa/Lambda Ratio: 5.67 ratio (ref 2.04–10.37)
Free Lambda Excretion/Day: 0.9 mg/d
Free Lambda Lt Chains,Ur: <0.03 mg/dL (ref 0.02–0.67)
Free Lt Chn Excr Rate: 5.1 mg/d
Time: 24 hours
Total Protein, Urine-Ur/day: 21 mg/d (ref 10–140)
Total Protein, Urine: 0.7 mg/dL
Volume, Urine: 3000 mL

## 2013-11-26 ENCOUNTER — Other Ambulatory Visit: Payer: PRIVATE HEALTH INSURANCE

## 2013-11-26 ENCOUNTER — Ambulatory Visit (HOSPITAL_BASED_OUTPATIENT_CLINIC_OR_DEPARTMENT_OTHER): Payer: PRIVATE HEALTH INSURANCE | Admitting: Internal Medicine

## 2013-11-26 VITALS — BP 151/85 | HR 90 | Temp 98.8°F | Resp 18 | Ht 71.0 in | Wt 344.4 lb

## 2013-11-26 DIAGNOSIS — D7589 Other specified diseases of blood and blood-forming organs: Secondary | ICD-10-CM

## 2013-11-26 DIAGNOSIS — M461 Sacroiliitis, not elsewhere classified: Secondary | ICD-10-CM

## 2013-11-26 DIAGNOSIS — G8929 Other chronic pain: Secondary | ICD-10-CM

## 2013-11-26 DIAGNOSIS — D892 Hypergammaglobulinemia, unspecified: Secondary | ICD-10-CM

## 2013-11-26 DIAGNOSIS — R404 Transient alteration of awareness: Secondary | ICD-10-CM

## 2013-11-26 NOTE — Progress Notes (Signed)
[name] OFFICE PROGRESS NOTE  No PCP Per Patient No address on file  DIAGNOSIS: Hypergammaglobulinemia  Sacroiliitis  Chief Complaint  Patient presents with  . Follow-up    CURRENT TREATMENT: None.  INTERVAL HISTORY: Charles Jenkins 53 y.o. male with a history of ankylosing spondylitis who was referred by his rheumatologist, Dr. Estanislado Pandy for further evaluation of elevated immunoglobulins is here for follow.  He was last seen by me on 11/12/2013.   As previously reported, he was last seen by Dr. Arlean Hopping office on Sep 22, 2013. Patient has a history of ankylosing spondyloarthropathy for he is on humira. Humira was restarted January 2015. He had back surgery on July, 2014 and it was held. He has plans to start remicade and now is back off of humira. He was receiving injections in the back prior to his back surgery. He is undergoing physical therapy. Today, he is accompanied by his [place]. He reports that he is feeling well overall. He has not had a screening colonoscopy. His mother has sclerodema and is on treatment. He dips tobacco for the past twenty years. His weight is stable but gained about 50 lbs over the past one year. He attributes this to inactvity.   MEDICAL HISTORY: Past Medical History  Diagnosis Date  . Hypertension   . Back pain   . Depression   . Ankylosing spondylitis   . Chronic right SI joint pain     INTERIM HISTORY: has Essential hypertension, benign; Sacroiliitis; Depression; Sciatica; Spinal stenosis of lumbar region; Insomnia; and Hypergammaglobulinemia on his problem list.    ALLERGIES:  has No Known Allergies.  MEDICATIONS: has a current medication list which includes the following prescription(s): folic acid, gabapentin, methotrexate, and oxycodone-acetaminophen.  SURGICAL HISTORY:  Past Surgical History  Procedure Laterality Date  . Lumbar laminectomy/decompression microdiscectomy Left 10/28/2012    Procedure: MICRO  LUMBAR DECOMPRESSION L4 - L5 and L5 - S1 2 LEVELS;  Surgeon: Johnn Hai, MD;  Location: [facility];  Service: Orthopedics;  Laterality: Left;    REVIEW OF SYSTEMS:   Constitutional: Denies fevers, chills or abnormal weight loss Eyes: Denies blurriness of vision Ears, nose, mouth, throat, and face: Denies mucositis or sore throat Respiratory: Denies cough, dyspnea or wheezes Cardiovascular: Denies palpitation, chest discomfort or lower extremity swelling Gastrointestinal:  Denies nausea, heartburn or change in bowel habits Skin: Denies abnormal skin rashes Lymphatics: Denies new lymphadenopathy or easy bruising Neurological:Denies numbness, tingling or new weaknesses Behavioral/Psych: Mood is stable, no new changes  All other systems were reviewed with the patient and are negative.  PHYSICAL EXAMINATION: ECOG PERFORMANCE STATUS: 0 - Asymptomatic  Blood pressure 151/85, pulse 90, temperature 98.8 F (37.1 C), temperature source Oral, resp. rate 18, height 5\' 11"  (1.803 m), weight 344 lb 6.4 oz (156.219 kg).  GENERAL:alert, no distress and comfortable; morbidly obese.  SKIN: skin color, texture, turgor are normal, no rashes or significant lesions EYES: normal, Conjunctiva are pink and non-injected, sclera clear OROPHARYNX:no exudate, no erythema and lips, buccal mucosa, and tongue normal  NECK: supple, thyroid normal size, non-tender, without nodularity LYMPH:  no palpable lymphadenopathy in the cervical, axillary or supraclavicular LUNGS: clear to auscultation with normal breathing effort, no wheezes or rhonchi HEART: regular rate & rhythm and no murmurs and no lower extremity edema ABDOMEN:abdomen soft, non-tender and normal bowel sounds Musculoskeletal:no cyanosis of digits and no clubbing  NEURO: alert & oriented x 3 with fluent speech, no focal motor/sensory deficits  Labs:  Lab Results  Component Value Date   WBC 7.6 11/12/2013   HGB 14.6 11/12/2013   HCT 45.2 11/12/2013    MCV 91.1 11/12/2013   PLT 264 11/12/2013   NEUTROABS 4.8 11/12/2013      Chemistry      Component Value Date/Time   NA 139 11/12/2013 1047   NA 135 10/26/2012 0900   K 4.2 11/12/2013 1047   K 4.1 10/26/2012 0900   CL 100 10/26/2012 0900   CO2 23 11/12/2013 1047   CO2 26 10/26/2012 0900   BUN 11.6 11/12/2013 1047   BUN 11 10/26/2012 0900   CREATININE 0.9 11/12/2013 1047   CREATININE 0.94 10/26/2012 0900      Component Value Date/Time   CALCIUM 9.3 11/12/2013 1047   CALCIUM 9.4 10/26/2012 0900   ALKPHOS 79 11/12/2013 1047   AST 28 11/12/2013 1047   ALT 42 11/12/2013 1047   BILITOT 0.38 11/12/2013 1047       Basic Metabolic Panel: No results found for this basename: NA, K, CL, CO2, GLUCOSE, BUN, CREATININE, CALCIUM, MG, PHOS,  in the last 168 hours GFR Estimated Creatinine Clearance: 149.6 ml/min (by C-G formula based on Cr of 0.9). Liver Function Tests: No results found for this basename: AST, ALT, ALKPHOS, BILITOT, PROT, ALBUMIN,  in the last 168 hours No results found for this basename: LIPASE, AMYLASE,  in the last 168 hours No results found for this basename: AMMONIA,  in the last 168 hours Coagulation profile No results found for this basename: INR, PROTIME,  in the last 168 hours  CBC: No results found for this basename: WBC, NEUTROABS, HGB, HCT, MCV, PLT,  in the last 168 hours  Anemia work up No results found for this basename: VITAMINB12, FOLATE, FERRITIN, TIBC, IRON, RETICCTPCT,  in the last 72 hours   Studies:  No results found.  Results for Charles, Jenkins (MRN [id number]) as of 11/26/2013 12:54  Ref. Range 11/12/2013 10:47  Albumin ELP Latest Range: 55.8-66.1 % 49.3 (L)  COMMENT (PROTEIN ELECTROPHOR) No range found *  Alpha-1-Globulin Latest Range: 2.9-4.9 % 4.7  Alpha-2-Globulin Latest Range: 7.1-11.8 % 10.5  Beta Globulin Latest Range: 4.7-7.2 % 7.3 (H)  Beta 2 Latest Range: 3.2-6.5 % 8.5 (H)  Gamma Globulin Latest Range: 11.1-18.8 % 19.7 (H)  M-SPIKE, % No range  found NOT DET  SPE Interp. No range found *  IgG (Immunoglobin G), Serum Latest Range: 203-449-2735 mg/dL 1470  IgA Latest Range: 68-379 mg/dL 621 (H)  IgM, Serum Latest Range: 41-251 mg/dL 64  Total Protein, Serum Electrophoresis Latest Range: 6.0-8.3 g/dL 7.3   Results for Charles Jenkins, WUERTZ (MRN [id number]) as of 11/26/2013 12:54  Ref. Range 11/12/2013 10:46  Kappa free light chain Latest Range: 0.33-1.94 mg/dL 2.70 (H)  Lambda Free Lght Chn Latest Range: 0.57-2.63 mg/dL 1.60  Kappa:Lambda Ratio Latest Range: 0.26-1.65  1.69 (H)   Results for CHILTON, SALLADE (MRN [id number]) as of 11/26/2013 12:54  Ref. Range 11/15/2013 08:29  Time-UPE24 No range found 24  Volume, Urine-UPE24 No range found 3000  Total Protein, Urine-UPE24 No range found 0.7  Total Protein, Urine-Ur/day Latest Range: 10-140 mg/day 21  ALBUMIN, U Latest Range: DETECTED  DETECTED  Alpha 1, Urine Latest Range: NONE DET  NONE DET  Alpha 2, Urine Latest Range: NONE DET  NONE DET  Beta, Urine Latest Range: NONE DET  NONE DET  Gamma Globulin, Urine Latest Range: NONE DET  NONE DET  Free Kappa Lt Chains,Ur Latest Range:  0.14-2.42 mg/dL 0.17  Free Lt Chn Excr Rate No range found 5.10  Free Lambda Lt Chains,Ur Latest Range: 0.02-0.67 mg/dL <0.03  Free Lambda Excretion/Day No range found 0.90  Free Kappa/Lambda Ratio Latest Range: 2.04-10.37 ratio 5.67   RADIOGRAPHIC STUDIES: No results found.  ASSESSMENT: Charles Jenkins 53 y.o. male with a history of Hypergammaglobulinemia  Sacroiliitis   PLAN:   1. Elevated immunoglobins.  --We ordered an SPEP plus IFE, UPEP plus IFE, kappa lambda light chains as noted above to rule out MGUS. The results are indicated above and are negative for monoclonal spike.  He has normal calcium (9.3), creatinine (0.9) and hemoglobin (14.6).  He likely has polyclonal elevated immunoglobins related to #2.   2. Ankylosing spondylitis complicated by chronic back pain.  --Patient counseled to continue  close follow up with rheumatology. He requested additional information on remicade and we provided him a summary handout at his request. He is off humara.   3. Morbid obesity.  --Patient reports continued dieting and exercise as tolerated. He states weight gain has been attributed to post surgery immobilization.   4. Daytime somnolence plus loud snoring.  --Patient is at high risk of OSA given wife reports loud snoring, and PE. He requests referral for sleep study prn.   5. Follow up.  --Patient will follow up on an as needed basis.  All questions were answered. The patient knows to call the clinic with any problems, questions or concerns. We can certainly see the patient much sooner if necessary.  I spent 10 minutes counseling the patient face to face. The total time spent in the appointment was 15 minutes.    Chelsie Burel, MD 11/26/2013 12:52 PM

## 2013-12-17 ENCOUNTER — Telehealth: Payer: Self-pay | Admitting: *Deleted

## 2013-12-17 NOTE — Telephone Encounter (Signed)
Faxed last 2 office notes to Dr. Bo Merino as ok with Dr. Juliann Mule. Dr. Estanislado Pandy   Phone    [phone]   ;    Fax      [phone].

## 2014-04-06 ENCOUNTER — Ambulatory Visit: Payer: Self-pay | Admitting: Orthopedic Surgery

## 2014-04-25 ENCOUNTER — Ambulatory Visit: Payer: Self-pay | Admitting: Orthopedic Surgery

## 2014-04-25 NOTE — H&P (Signed)
Charles Jenkins is an 53 y.o. male.   Chief Complaint: back and leg pain HPI: The patient is a 53 year old male who presents today for follow up of their back. The patient is being followed for their back pain. They are now 18 month(s) out from lumbar decompression and 20 months out from the injury. Symptoms reported today include: pain. The patient states that they are doing poorly. Current treatment includes: relative rest, activity modification and pain medications. The following medication has been used for pain control: Percocet and Neurontin and Percocet. The patient presents today following ESI L3-4 x 13 days.. Note for "Follow-up back": The patient is currently out of work. NCM [name].  Past Medical History  Diagnosis Date  . Hypertension   . Back pain   . Depression   . Ankylosing spondylitis   . Chronic right SI joint pain     Past Surgical History  Procedure Laterality Date  . Lumbar laminectomy/decompression microdiscectomy Left 10/28/2012    Procedure: MICRO LUMBAR DECOMPRESSION L4 - L5 and L5 - S1 2 LEVELS;  Surgeon: Johnn Hai, MD;  Location: [facility];  Service: Orthopedics;  Laterality: Left;    No family history on file. Social History:  reports that he has been smoking Cigars.  His smokeless tobacco use includes Snuff. He reports that he drinks alcohol. He reports that he does not use illicit drugs.  Allergies: No Known Allergies   (Not in a hospital admission)  No results found for this or any previous visit (from the past 48 hour(s)). No results found.  Review of Systems  Constitutional: Negative.   HENT: Negative.   Eyes: Negative.   Respiratory: Negative.   Cardiovascular: Negative.   Gastrointestinal: Negative.   Genitourinary: Negative.   Musculoskeletal: Positive for back pain.  Skin: Negative.   Neurological: Positive for focal weakness.  Psychiatric/Behavioral: Negative.     There were no vitals taken for this visit. Physical Exam   Constitutional: He is oriented to person, place, and time. He appears well-developed and well-nourished.  HENT:  Head: Normocephalic and atraumatic.  Eyes: Conjunctivae and EOM are normal. Pupils are equal, round, and reactive to light.  Neck: Normal range of motion. Neck supple.  Cardiovascular: Normal rate and regular rhythm.   Respiratory: Effort normal and breath sounds normal.  GI: Soft. Bowel sounds are normal.  Musculoskeletal:  On exam, SLR produces buttock and thigh pain on the left. He has 5-/5 quad strength. He has limited extension, relieved with forward flexion. He has no pain down the back of his leg. Motor is 5/5 in EHL and plantar flexion. No DVT. No instability of hips, knees or ankles. His epidurals at 3-4 were technically successful.  Neurological: He is alert and oriented to person, place, and time. He has normal reflexes.  Skin: Skin is warm and dry.  Psychiatric: He has a normal mood and affect.     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 Congenital and acquired. Patient has improved with formal supervised P.T. program, however he had an exacerbation. This epidural did not help. He's had a CT myelogram which demonstrated severe stenosis at 3-4. This is when he's standing. He also reports he cannot do anything, can't walk. He has pain and numbness down in particularly the left leg.  History of lumbar decompression at 5-1, absence of L5-S1 radiculopathy following decompression. Currently with stenosis which, by MRI, is moderately severe and by CT  myelogram. Moderate to severe stenosis at 3-4.  Given the persistence of his symptoms despite conservative treatment, we discussed 2 options: one, living with his symptoms like they are now, or decompression at 3-4 as we discussed previously. We discussed this in front of the patient and with [name], case  manager. Overnight in the hospital. He weighs over 300. He's on Methotrexate. He cannot take Humira until we get his back situation taken care of. We discussed decompression.  Risks and benefits of this procedure were discussed with the patient including worsening of symptoms, no changes in symptoms, recurrent disc herniation, scar tissue, epidural fibrosis, damage to neurovascular structures, cerebral spinal fluid leak which would require repair or patching, DVT, PE, anesthetic complications, etc. We discussed the perioperative course, the hospitalization, and the need for postoperative rehabilitation and the time estimate for recovery. We also discussed the possibility of future surgery including repeat decompression, fusion. The patient was provided an illustrated handout which was discussed in detail. Appropriate anatomic models were used as well.  Continue Percocet, Robaxin, and Neurontin. He sees Dr. Estanislado Pandy. I'd like her recommendations at least in the post op. period for 2 weeks coming off of the Methotrexate to reduce the risk of infection. Discussed this with [name], case manager. We will proceed accordingly. Afterwards, engage in P.T. and he will probably, at [place] at 3 months post op., have permanent restrictions, and I don't feel he will be able to return at his heavy duty capacity that we were attempting to obtain with this conservative treatment. I would not recommend a fusion. He has minimal back pain. His MRI is less impressive than his upright extension myelogram.  Plan microlumbar decompression L3-4, possible L4-5  Demonte Dobratz M. PA-C for Dr. Tonita Cong 04/25/2014, 9:14 AM

## 2014-04-27 ENCOUNTER — Encounter (HOSPITAL_COMMUNITY)
Admission: RE | Admit: 2014-04-27 | Discharge: 2014-04-27 | Disposition: A | Discharge status: Home / Self Care | Payer: Worker's Compensation | Source: Ambulatory Visit | Attending: Specialist | Admitting: Specialist

## 2014-04-27 ENCOUNTER — Encounter (HOSPITAL_COMMUNITY): Payer: Self-pay

## 2014-04-27 ENCOUNTER — Ambulatory Visit (HOSPITAL_COMMUNITY)
Admission: RE | Admit: 2014-04-27 | Discharge: 2014-04-27 | Disposition: A | Discharge status: Home / Self Care | Payer: PRIVATE HEALTH INSURANCE | Source: Ambulatory Visit | Attending: Orthopedic Surgery | Admitting: Orthopedic Surgery

## 2014-04-27 ENCOUNTER — Ambulatory Visit (HOSPITAL_COMMUNITY)
Admission: RE | Admit: 2014-04-27 | Discharge: 2014-04-27 | Disposition: A | Discharge status: Home / Self Care | Payer: PRIVATE HEALTH INSURANCE | Source: Ambulatory Visit | Attending: Anesthesiology | Admitting: Anesthesiology

## 2014-04-27 DIAGNOSIS — M5134 Other intervertebral disc degeneration, thoracic region: Secondary | ICD-10-CM | POA: Insufficient documentation

## 2014-04-27 DIAGNOSIS — M48061 Spinal stenosis, lumbar region without neurogenic claudication: Secondary | ICD-10-CM

## 2014-04-27 DIAGNOSIS — I10 Essential (primary) hypertension: Secondary | ICD-10-CM | POA: Diagnosis not present

## 2014-04-27 DIAGNOSIS — Z01818 Encounter for other preprocedural examination: Secondary | ICD-10-CM | POA: Insufficient documentation

## 2014-04-27 HISTORY — DX: Unspecified osteoarthritis, unspecified site: M19.90

## 2014-04-27 LAB — CBC
HEMATOCRIT: 45.1 % (ref 39.0–52.0)
Hemoglobin: 14 g/dL (ref 13.0–17.0)
MCH: 28.1 pg (ref 26.0–34.0)
MCHC: 31 g/dL (ref 30.0–36.0)
MCV: 90.6 fL (ref 78.0–100.0)
Platelets: 299 10*3/uL (ref 150–400)
RBC: 4.98 MIL/uL (ref 4.22–5.81)
RDW: 14.3 % (ref 11.5–15.5)
WBC: 7.5 10*3/uL (ref 4.0–10.5)

## 2014-04-27 LAB — BASIC METABOLIC PANEL
ANION GAP: 8 (ref 5–15)
BUN: 14 mg/dL (ref 6–23)
CO2: 27 mmol/L (ref 19–32)
Calcium: 9.1 mg/dL (ref 8.4–10.5)
Chloride: 105 mEq/L (ref 96–112)
Creatinine, Ser: 0.81 mg/dL (ref 0.50–1.35)
GFR calc non Af Amer: >90 mL/min (ref >90)
Glucose, Bld: 130 mg/dL — ABNORMAL HIGH (ref 70–99)
POTASSIUM: 4.2 mmol/L (ref 3.5–5.1)
Sodium: 140 mmol/L (ref 135–145)

## 2014-04-27 LAB — SURGICAL PCR SCREEN
MRSA, PCR: NEGATIVE
STAPHYLOCOCCUS AUREUS: POSITIVE — AB

## 2014-04-27 NOTE — Patient Instructions (Signed)
Charles Jenkins  04/27/2014   Your procedure is scheduled on: Wednesday 05/04/14  Report to [facility]  Entrance and follow signs to               [place] at 09:00 AM.  Call this number if you have problems the morning of surgery [phone]   Remember:  Do not eat food or drink liquids :After Midnight.     Take these medicines the morning of surgery with A SIP OF WATER: gabapentin                               You may not have any metal on your body including hair pins and              piercings  Do not wear jewelry, make-up, lotions, powders or perfumes.             Do not wear nail polish.  Do not shave  48 hours prior to surgery.              Men may shave face and neck.  Do not bring valuables to the hospital. [place].  Contacts, dentures or bridgework may not be worn into surgery.               Please read over the following fact sheets you were given: MRSA information  _____________________________________________________________________             [facility] - Preparing for Surgery Before surgery, you can play an important role.  Because skin is not sterile, your skin needs to be as free of germs as possible.  You can reduce the number of germs on your skin by washing with CHG (chlorahexidine gluconate) soap before surgery.  CHG is an antiseptic cleaner which kills germs and bonds with the skin to continue killing germs even after washing. Please DO NOT use if you have an allergy to CHG or antibacterial soaps.  If your skin becomes reddened/irritated stop using the CHG and inform your nurse when you arrive at Short Stay. Do not shave (including legs and underarms) for at least 48 hours prior to the first CHG shower.  You may shave your face/neck. Please follow these instructions carefully:  1.  Shower with CHG Soap the night before surgery and the  morning of Surgery.  2.  If you choose  to wash your hair, wash your hair first as usual with your  normal  shampoo.  3.  After you shampoo, rinse your hair and body thoroughly to remove the  shampoo.                            4.  Use CHG as you would any other liquid soap.  You can apply chg directly  to the skin and wash                       Gently with a scrungie or clean washcloth.  5.  Apply the CHG Soap to your body ONLY FROM THE NECK DOWN.   Do not use on face/ open  Wound or open sores. Avoid contact with eyes, ears mouth and genitals (private parts).                       Wash face,  Genitals (private parts) with your normal soap.             6.  Wash thoroughly, paying special attention to the area where your surgery  will be performed.  7.  Thoroughly rinse your body with warm water from the neck down.  8.  DO NOT shower/wash with your normal soap after using and rinsing off  the CHG Soap.                9.  Pat yourself dry with a clean towel.            10.  Wear clean pajamas.            11.  Place clean sheets on your bed the night of your first shower and do not  sleep with pets. Day of Surgery : Do not apply any lotions/deodorants the morning of surgery.  Please wear clean clothes to the hospital/surgery center.  FAILURE TO FOLLOW THESE INSTRUCTIONS MAY RESULT IN THE CANCELLATION OF YOUR SURGERY PATIENT SIGNATURE_________________________________  NURSE SIGNATURE__________________________________  ________________________________________________________________________   Charles Jenkins  An incentive spirometer is a tool that can help keep your lungs clear and active. This tool measures how well you are filling your lungs with each breath. Taking long deep breaths may help reverse or decrease the chance of developing breathing (pulmonary) problems (especially infection) following:  A long period of time when you are unable to move or be active. BEFORE THE PROCEDURE   If the  spirometer includes an indicator to show your best effort, your nurse or respiratory therapist will set it to a desired goal.  If possible, sit up straight or lean slightly forward. Try not to slouch.  Hold the incentive spirometer in an upright position. INSTRUCTIONS FOR USE  1. Sit on the edge of your bed if possible, or sit up as far as you can in bed or on a chair. 2. Hold the incentive spirometer in an upright position. 3. Breathe out normally. 4. Place the mouthpiece in your mouth and seal your lips tightly around it. 5. Breathe in slowly and as deeply as possible, raising the piston or the ball toward the top of the column. 6. Hold your breath for 3-5 seconds or for as long as possible. Allow the piston or ball to fall to the bottom of the column. 7. Remove the mouthpiece from your mouth and breathe out normally. 8. Rest for a few seconds and repeat Steps 1 through 7 at least 10 times every 1-2 hours when you are awake. Take your time and take a few normal breaths between deep breaths. 9. The spirometer may include an indicator to show your best effort. Use the indicator as a goal to work toward during each repetition. 10. After each set of 10 deep breaths, practice coughing to be sure your lungs are clear. If you have an incision (the cut made at the time of surgery), support your incision when coughing by placing a pillow or rolled up towels firmly against it. Once you are able to get out of bed, walk around indoors and cough well. You may stop using the incentive spirometer when instructed by your caregiver.  RISKS AND COMPLICATIONS  Take your time so you do not get  dizzy or light-headed.  If you are in pain, you may need to take or ask for pain medication before doing incentive spirometry. It is harder to take a deep breath if you are having pain. AFTER USE  Rest and breathe slowly and easily.  It can be helpful to keep track of a log of your progress. Your caregiver can provide  you with a simple table to help with this. If you are using the spirometer at home, follow these instructions: [place] IF:   You are having difficultly using the spirometer.  You have trouble using the spirometer as often as instructed.  Your pain medication is not giving enough relief while using the spirometer.  You develop fever of 100.5 F (38.1 C) or higher. SEEK IMMEDIATE MEDICAL CARE IF:   You cough up bloody sputum that had not been present before.  You develop fever of 102 F (38.9 C) or greater.  You develop worsening pain at or near the incision site. MAKE SURE YOU:   Understand these instructions.  Will watch your condition.  Will get help right away if you are not doing well or get worse. Document Released: 08/26/2006 Document Revised: 07/08/2011 Document Reviewed: 10/27/2006 [facility] Patient Information 2014 [place], [place].   ________________________________________________________________________

## 2014-04-27 NOTE — Progress Notes (Signed)
Final EKG done 04/27/2014 in [facility].

## 2014-04-27 NOTE — Progress Notes (Signed)
   04/27/14 1008  OBSTRUCTIVE SLEEP APNEA  Have you ever been diagnosed with sleep apnea through a sleep study? No  Do you snore loudly (loud enough to be heard through closed doors)?  1  Do you often feel tired, fatigued, or sleepy during the daytime? 0  Has anyone observed you stop breathing during your sleep? 0  Do you have, or are you being treated for high blood pressure? 0  BMI more than 35 kg/m2? 1  Age over 51 years old? 1  Neck circumference greater than 40 cm/16 inches? 1  Gender: 1  Obstructive Sleep Apnea Score 5  Score 4 or greater  Results sent to PCP

## 2014-04-28 ENCOUNTER — Ambulatory Visit: Payer: Self-pay | Admitting: Orthopedic Surgery

## 2014-05-03 MED ORDER — DEXTROSE 5 % IV SOLN
3.0000 g | INTRAVENOUS | Status: AC
  Administered 2014-05-04: 3 g via INTRAVENOUS
  Filled 2014-05-03: qty 3000

## 2014-05-04 ENCOUNTER — Encounter (HOSPITAL_COMMUNITY)
Admission: RE | Disposition: A | Discharge status: Home / Self Care | Payer: Self-pay | Source: Ambulatory Visit | Attending: Specialist

## 2014-05-04 ENCOUNTER — Encounter (HOSPITAL_COMMUNITY): Payer: Self-pay | Admitting: *Deleted

## 2014-05-04 ENCOUNTER — Ambulatory Visit (HOSPITAL_COMMUNITY): Payer: Worker's Compensation

## 2014-05-04 ENCOUNTER — Ambulatory Visit (HOSPITAL_COMMUNITY): Payer: Worker's Compensation | Admitting: Anesthesiology

## 2014-05-04 ENCOUNTER — Observation Stay (HOSPITAL_COMMUNITY)
Admission: RE | Admit: 2014-05-04 | Discharge: 2014-05-05 | Disposition: A | Discharge status: Home / Self Care | Payer: Worker's Compensation | Source: Ambulatory Visit | Attending: Specialist | Admitting: Specialist

## 2014-05-04 DIAGNOSIS — I10 Essential (primary) hypertension: Secondary | ICD-10-CM | POA: Diagnosis not present

## 2014-05-04 DIAGNOSIS — Z6841 Body Mass Index (BMI) 40.0 and over, adult: Secondary | ICD-10-CM | POA: Insufficient documentation

## 2014-05-04 DIAGNOSIS — F17290 Nicotine dependence, other tobacco product, uncomplicated: Secondary | ICD-10-CM | POA: Diagnosis not present

## 2014-05-04 DIAGNOSIS — M4806 Spinal stenosis, lumbar region: Principal | ICD-10-CM | POA: Insufficient documentation

## 2014-05-04 DIAGNOSIS — M48061 Spinal stenosis, lumbar region without neurogenic claudication: Secondary | ICD-10-CM | POA: Diagnosis present

## 2014-05-04 DIAGNOSIS — F329 Major depressive disorder, single episode, unspecified: Secondary | ICD-10-CM | POA: Insufficient documentation

## 2014-05-04 DIAGNOSIS — Z419 Encounter for procedure for purposes other than remedying health state, unspecified: Secondary | ICD-10-CM

## 2014-05-04 HISTORY — PX: LUMBAR LAMINECTOMY/DECOMPRESSION MICRODISCECTOMY: SHX5026

## 2014-05-04 SURGERY — LUMBAR LAMINECTOMY/DECOMPRESSION MICRODISCECTOMY 2 LEVELS
Anesthesia: General | Site: Back

## 2014-05-04 MED ORDER — LACTATED RINGERS IV SOLN
INTRAVENOUS | Status: DC
  Administered 2014-05-04: 1000 mL via INTRAVENOUS
  Administered 2014-05-04 (×2): via INTRAVENOUS

## 2014-05-04 MED ORDER — PROPOFOL 10 MG/ML IV BOLUS
INTRAVENOUS | Status: AC
  Filled 2014-05-04: qty 20

## 2014-05-04 MED ORDER — CEFAZOLIN SODIUM-DEXTROSE 2-3 GM-% IV SOLR
2.0000 g | Freq: Three times a day (TID) | INTRAVENOUS | Status: AC
  Administered 2014-05-04 – 2014-05-05 (×3): 2 g via INTRAVENOUS
  Filled 2014-05-04 (×3): qty 50

## 2014-05-04 MED ORDER — HYDROCODONE-ACETAMINOPHEN 5-325 MG PO TABS: 1.0000 | ORAL_TABLET | ORAL | Status: DC | PRN

## 2014-05-04 MED ORDER — ACETAMINOPHEN 10 MG/ML IV SOLN
1000.0000 mg | Freq: Once | INTRAVENOUS | Status: AC
  Administered 2014-05-04: 1000 mg via INTRAVENOUS
  Filled 2014-05-04: qty 100

## 2014-05-04 MED ORDER — SODIUM CHLORIDE 0.9 % IV SOLN: 250.0000 mL | INTRAVENOUS | Status: DC

## 2014-05-04 MED ORDER — SUFENTANIL CITRATE 50 MCG/ML IV SOLN
INTRAVENOUS | Status: AC
Start: 2014-05-04 — End: 2014-05-04
  Filled 2014-05-04: qty 1

## 2014-05-04 MED ORDER — CLINDAMYCIN PHOSPHATE 900 MG/50ML IV SOLN
900.0000 mg | INTRAVENOUS | Status: AC
  Administered 2014-05-04: 900 mg via INTRAVENOUS

## 2014-05-04 MED ORDER — CLINDAMYCIN PHOSPHATE 900 MG/50ML IV SOLN
INTRAVENOUS | Status: AC
  Filled 2014-05-04: qty 50

## 2014-05-04 MED ORDER — THROMBIN 5000 UNITS EX SOLR
CUTANEOUS | Status: AC
  Filled 2014-05-04: qty 10000

## 2014-05-04 MED ORDER — SODIUM CHLORIDE 0.9 % IJ SOLN
3.0000 mL | Freq: Two times a day (BID) | INTRAMUSCULAR | Status: DC
  Administered 2014-05-05: 3 mL via INTRAVENOUS

## 2014-05-04 MED ORDER — METHOCARBAMOL 500 MG PO TABS
500.0000 mg | ORAL_TABLET | Freq: Four times a day (QID) | ORAL | Status: DC | PRN
  Administered 2014-05-04 (×2): 500 mg via ORAL
  Filled 2014-05-04 (×2): qty 1

## 2014-05-04 MED ORDER — NEOSTIGMINE METHYLSULFATE 10 MG/10ML IV SOLN
INTRAVENOUS | Status: DC | PRN
  Administered 2014-05-04: 5 mg via INTRAVENOUS

## 2014-05-04 MED ORDER — BUPIVACAINE-EPINEPHRINE (PF) 0.5% -1:200000 IJ SOLN
INTRAMUSCULAR | Status: DC | PRN
Start: 2014-05-04 — End: 2014-05-04
  Administered 2014-05-04: 15 mL

## 2014-05-04 MED ORDER — LIDOCAINE HCL (CARDIAC) 20 MG/ML IV SOLN
INTRAVENOUS | Status: DC | PRN
  Administered 2014-05-04: 100 mg via INTRAVENOUS

## 2014-05-04 MED ORDER — MEPERIDINE HCL 50 MG/ML IJ SOLN: 6.2500 mg | INTRAMUSCULAR | Status: DC | PRN

## 2014-05-04 MED ORDER — THROMBIN 5000 UNITS EX SOLR
OROMUCOSAL | Status: DC | PRN
  Administered 2014-05-04: 10 mL via TOPICAL

## 2014-05-04 MED ORDER — SODIUM CHLORIDE 0.9 % IR SOLN
Status: AC
  Filled 2014-05-04: qty 1

## 2014-05-04 MED ORDER — GLYCOPYRROLATE 0.2 MG/ML IJ SOLN
INTRAMUSCULAR | Status: AC
  Filled 2014-05-04: qty 4

## 2014-05-04 MED ORDER — DOCUSATE SODIUM 100 MG PO CAPS
100.0000 mg | ORAL_CAPSULE | Freq: Two times a day (BID) | ORAL | Status: DC
  Administered 2014-05-04 – 2014-05-05 (×2): 100 mg via ORAL

## 2014-05-04 MED ORDER — SENNOSIDES-DOCUSATE SODIUM 8.6-50 MG PO TABS: 1.0000 | ORAL_TABLET | Freq: Every evening | ORAL | Status: DC | PRN

## 2014-05-04 MED ORDER — SODIUM CHLORIDE 0.9 % IR SOLN
Status: DC | PRN
  Administered 2014-05-04: 500 mL

## 2014-05-04 MED ORDER — METHOCARBAMOL 500 MG PO TABS: 500.0000 mg | ORAL_TABLET | Freq: Three times a day (TID) | ORAL | Status: DC | PRN

## 2014-05-04 MED ORDER — SODIUM CHLORIDE 0.9 % IJ SOLN: 3.0000 mL | INTRAMUSCULAR | Status: DC | PRN

## 2014-05-04 MED ORDER — HYDROMORPHONE HCL 1 MG/ML IJ SOLN
INTRAMUSCULAR | Status: AC
  Administered 2014-05-04: 0.25 mg via INTRAVENOUS
  Filled 2014-05-04: qty 1

## 2014-05-04 MED ORDER — ALUM & MAG HYDROXIDE-SIMETH 200-200-20 MG/5ML PO SUSP: 30.0000 mL | Freq: Four times a day (QID) | ORAL | Status: DC | PRN

## 2014-05-04 MED ORDER — BISACODYL 5 MG PO TBEC: 5.0000 mg | DELAYED_RELEASE_TABLET | Freq: Every day | ORAL | Status: DC | PRN

## 2014-05-04 MED ORDER — GABAPENTIN 300 MG PO CAPS
900.0000 mg | ORAL_CAPSULE | Freq: Three times a day (TID) | ORAL | Status: DC
  Administered 2014-05-04 – 2014-05-05 (×2): 900 mg via ORAL
  Filled 2014-05-04 (×4): qty 3

## 2014-05-04 MED ORDER — MIDAZOLAM HCL 2 MG/2ML IJ SOLN
INTRAMUSCULAR | Status: AC
  Filled 2014-05-04: qty 2

## 2014-05-04 MED ORDER — EPHEDRINE SULFATE 50 MG/ML IJ SOLN
INTRAMUSCULAR | Status: DC | PRN
Start: 2014-05-04 — End: 2014-05-04
  Administered 2014-05-04: 5 mg via INTRAVENOUS

## 2014-05-04 MED ORDER — SUCCINYLCHOLINE CHLORIDE 20 MG/ML IJ SOLN
INTRAMUSCULAR | Status: DC | PRN
  Administered 2014-05-04: 140 mg via INTRAVENOUS

## 2014-05-04 MED ORDER — ACETAMINOPHEN 325 MG PO TABS: 650.0000 mg | ORAL_TABLET | ORAL | Status: DC | PRN

## 2014-05-04 MED ORDER — SODIUM CHLORIDE 0.9 % IJ SOLN
INTRAMUSCULAR | Status: AC
  Filled 2014-05-04: qty 10

## 2014-05-04 MED ORDER — DOCUSATE SODIUM 100 MG PO CAPS: 100.0000 mg | ORAL_CAPSULE | Freq: Two times a day (BID) | ORAL | Status: AC | PRN

## 2014-05-04 MED ORDER — ONDANSETRON HCL 4 MG/2ML IJ SOLN
INTRAMUSCULAR | Status: AC
  Filled 2014-05-04: qty 2

## 2014-05-04 MED ORDER — HYDROMORPHONE HCL 1 MG/ML IJ SOLN
0.2500 mg | INTRAMUSCULAR | Status: DC | PRN
  Administered 2014-05-04 (×4): 0.25 mg via INTRAVENOUS

## 2014-05-04 MED ORDER — CISATRACURIUM BESYLATE (PF) 10 MG/5ML IV SOLN
INTRAVENOUS | Status: DC | PRN
  Administered 2014-05-04: 2 mg via INTRAVENOUS
  Administered 2014-05-04: 10 mg via INTRAVENOUS
  Administered 2014-05-04: 3 mg via INTRAVENOUS

## 2014-05-04 MED ORDER — GLYCOPYRROLATE 0.2 MG/ML IJ SOLN
INTRAMUSCULAR | Status: DC | PRN
  Administered 2014-05-04: .8 mg via INTRAVENOUS

## 2014-05-04 MED ORDER — ONDANSETRON HCL 4 MG/2ML IJ SOLN
INTRAMUSCULAR | Status: DC | PRN
  Administered 2014-05-04: 4 mg via INTRAVENOUS

## 2014-05-04 MED ORDER — LACTATED RINGERS IV SOLN: INTRAVENOUS | Status: DC

## 2014-05-04 MED ORDER — DEXTROSE 5 % IV SOLN
500.0000 mg | Freq: Four times a day (QID) | INTRAVENOUS | Status: DC | PRN
  Filled 2014-05-04 (×2): qty 5

## 2014-05-04 MED ORDER — PROMETHAZINE HCL 25 MG/ML IJ SOLN: 6.2500 mg | INTRAMUSCULAR | Status: DC | PRN

## 2014-05-04 MED ORDER — OXYCODONE HCL 10 MG PO TABS: ORAL_TABLET | ORAL | Status: DC

## 2014-05-04 MED ORDER — ONDANSETRON HCL 4 MG/2ML IJ SOLN: 4.0000 mg | INTRAMUSCULAR | Status: DC | PRN

## 2014-05-04 MED ORDER — NEOSTIGMINE METHYLSULFATE 10 MG/10ML IV SOLN
INTRAVENOUS | Status: AC
  Filled 2014-05-04: qty 1

## 2014-05-04 MED ORDER — ACETAMINOPHEN 10 MG/ML IV SOLN
1000.0000 mg | Freq: Once | INTRAVENOUS | Status: DC
  Filled 2014-05-04: qty 100

## 2014-05-04 MED ORDER — SUFENTANIL CITRATE 50 MCG/ML IV SOLN
INTRAVENOUS | Status: DC | PRN
  Administered 2014-05-04: 5 ug via INTRAVENOUS
  Administered 2014-05-04: 20 ug via INTRAVENOUS
  Administered 2014-05-04 (×2): 5 ug via INTRAVENOUS

## 2014-05-04 MED ORDER — MENTHOL 3 MG MT LOZG: 1.0000 | LOZENGE | OROMUCOSAL | Status: DC | PRN

## 2014-05-04 MED ORDER — ACETAMINOPHEN 650 MG RE SUPP
650.0000 mg | RECTAL | Status: DC | PRN
Start: 2014-05-04 — End: 2014-05-05

## 2014-05-04 MED ORDER — MAGNESIUM CITRATE PO SOLN: 1.0000 | Freq: Once | ORAL | Status: AC | PRN

## 2014-05-04 MED ORDER — PROPOFOL 10 MG/ML IV BOLUS
INTRAVENOUS | Status: DC | PRN
  Administered 2014-05-04: 250 mg via INTRAVENOUS

## 2014-05-04 MED ORDER — BUPIVACAINE-EPINEPHRINE (PF) 0.5% -1:200000 IJ SOLN
INTRAMUSCULAR | Status: AC
  Filled 2014-05-04: qty 30

## 2014-05-04 MED ORDER — HYDROMORPHONE HCL 1 MG/ML IJ SOLN
0.5000 mg | INTRAMUSCULAR | Status: DC | PRN
  Administered 2014-05-05: 1 mg via INTRAVENOUS
  Filled 2014-05-04: qty 1

## 2014-05-04 MED ORDER — KCL IN DEXTROSE-NACL 20-5-0.45 MEQ/L-%-% IV SOLN
INTRAVENOUS | Status: DC
  Administered 2014-05-04: 17:00:00 via INTRAVENOUS
  Filled 2014-05-04 (×2): qty 1000

## 2014-05-04 MED ORDER — PHENOL 1.4 % MT LIQD
1.0000 | OROMUCOSAL | Status: DC | PRN
Start: 2014-05-04 — End: 2014-05-05

## 2014-05-04 MED ORDER — MIDAZOLAM HCL 5 MG/5ML IJ SOLN
INTRAMUSCULAR | Status: DC | PRN
  Administered 2014-05-04: 2 mg via INTRAVENOUS

## 2014-05-04 MED ORDER — DEXAMETHASONE SODIUM PHOSPHATE 10 MG/ML IJ SOLN
INTRAMUSCULAR | Status: DC | PRN
  Administered 2014-05-04: 10 mg via INTRAVENOUS

## 2014-05-04 MED ORDER — LIDOCAINE HCL (CARDIAC) 20 MG/ML IV SOLN
INTRAVENOUS | Status: AC
  Filled 2014-05-04: qty 5

## 2014-05-04 MED ORDER — FOLIC ACID 1 MG PO TABS
1.0000 mg | ORAL_TABLET | Freq: Every day | ORAL | Status: DC
  Administered 2014-05-05: 1 mg via ORAL
  Filled 2014-05-04: qty 1

## 2014-05-04 MED ORDER — OXYCODONE-ACETAMINOPHEN 5-325 MG PO TABS
1.0000 | ORAL_TABLET | ORAL | Status: DC | PRN
  Administered 2014-05-04 – 2014-05-05 (×5): 2 via ORAL
  Filled 2014-05-04 (×5): qty 2

## 2014-05-04 MED ORDER — CISATRACURIUM BESYLATE 20 MG/10ML IV SOLN
INTRAVENOUS | Status: AC
  Filled 2014-05-04: qty 10

## 2014-05-04 MED ORDER — EPHEDRINE SULFATE 50 MG/ML IJ SOLN
INTRAMUSCULAR | Status: AC
  Filled 2014-05-04: qty 1

## 2014-05-04 SURGICAL SUPPLY — 46 items
BAG ZIPLOCK 12X15 (MISCELLANEOUS) IMPLANT
BLADE EXTENDED COATED 6.5IN (ELECTRODE) ×2 IMPLANT
CLEANER TIP ELECTROSURG 2X2 (MISCELLANEOUS) ×2 IMPLANT
CLOTH 2% CHLOROHEXIDINE 3PK (PERSONAL CARE ITEMS) ×2 IMPLANT
DRAPE MICROSCOPE LEICA (MISCELLANEOUS) ×2 IMPLANT
DRAPE POUCH INSTRU U-SHP 10X18 (DRAPES) ×2 IMPLANT
DRAPE SHEET LG 3/4 BI-LAMINATE (DRAPES) ×2 IMPLANT
DRAPE SURG 17X11 SM STRL (DRAPES) ×2 IMPLANT
DRAPE UTILITY XL STRL (DRAPES) ×2 IMPLANT
DRSG AQUACEL AG ADV 3.5X 4 (GAUZE/BANDAGES/DRESSINGS) IMPLANT
DRSG AQUACEL AG ADV 3.5X 6 (GAUZE/BANDAGES/DRESSINGS) ×2 IMPLANT
DURAPREP 26ML APPLICATOR (WOUND CARE) ×2 IMPLANT
DURASEAL SPINE SEALANT 3ML (MISCELLANEOUS) IMPLANT
ELECT BLADE TIP CTD 4 INCH (ELECTRODE) ×4 IMPLANT
ELECT REM PT RETURN 9FT ADLT (ELECTROSURGICAL) ×2
ELECTRODE REM PT RTRN 9FT ADLT (ELECTROSURGICAL) ×1 IMPLANT
GLOVE BIOGEL PI IND STRL 7.5 (GLOVE) ×1 IMPLANT
GLOVE BIOGEL PI INDICATOR 7.5 (GLOVE) ×1
GLOVE SURG SS PI 7.5 STRL IVOR (GLOVE) ×2 IMPLANT
GLOVE SURG SS PI 8.0 STRL IVOR (GLOVE) ×4 IMPLANT
GOWN STRL REUS W/TWL XL LVL3 (GOWN DISPOSABLE) ×4 IMPLANT
IV CATH 14GX2 1/4 (CATHETERS) ×2 IMPLANT
KIT BASIN OR (CUSTOM PROCEDURE TRAY) ×2 IMPLANT
MANIFOLD NEPTUNE II (INSTRUMENTS) ×2 IMPLANT
NEEDLE SPNL 18GX3.5 QUINCKE PK (NEEDLE) ×4 IMPLANT
PACK LAMINECTOMY ORTHO (CUSTOM PROCEDURE TRAY) ×2 IMPLANT
PATTIES SURGICAL .5 X.5 (GAUZE/BANDAGES/DRESSINGS) IMPLANT
PATTIES SURGICAL .75X.75 (GAUZE/BANDAGES/DRESSINGS) ×2 IMPLANT
PATTIES SURGICAL 1X1 (DISPOSABLE) IMPLANT
SPONGE SURGIFOAM ABS GEL 100 (HEMOSTASIS) ×2 IMPLANT
STAPLER VISISTAT (STAPLE) ×2 IMPLANT
STRIP CLOSURE SKIN 1/2X4 (GAUZE/BANDAGES/DRESSINGS) ×2 IMPLANT
SUT NURALON 4 0 TR CR/8 (SUTURE) IMPLANT
SUT PROLENE 3 0 PS 2 (SUTURE) ×2 IMPLANT
SUT VIC AB 1 CT1 27 (SUTURE) ×1
SUT VIC AB 1 CT1 27XBRD ANTBC (SUTURE) ×1 IMPLANT
SUT VIC AB 1-0 CT2 27 (SUTURE) IMPLANT
SUT VIC AB 2-0 CT1 27 (SUTURE) ×3
SUT VIC AB 2-0 CT1 TAPERPNT 27 (SUTURE) ×3 IMPLANT
SUT VIC AB 2-0 CT2 27 (SUTURE) IMPLANT
SUT VLOC 180 0 24IN GS25 (SUTURE) ×2 IMPLANT
SYR 3ML LL SCALE MARK (SYRINGE) ×2 IMPLANT
TOWEL OR 17X26 10 PK STRL BLUE (TOWEL DISPOSABLE) ×2 IMPLANT
TOWEL OR NON WOVEN STRL DISP B (DISPOSABLE) ×2 IMPLANT
TRAY FOLEY CATH 16FRSI W/METER (SET/KITS/TRAYS/PACK) ×2 IMPLANT
YANKAUER SUCT BULB TIP NO VENT (SUCTIONS) ×2 IMPLANT

## 2014-05-04 NOTE — Transfer of Care (Signed)
Immediate Anesthesia Transfer of Care Note  Patient: Charles Jenkins  Procedure(s) Performed: Procedure(s): MICRO LUMBAR DECOMPRESSION L2-L3, L3-L4 (N/A)  Patient Location: PACU  Anesthesia Type:General  Level of Consciousness: awake, alert  and oriented  Airway & Oxygen Therapy: Patient Spontanous Breathing and Patient connected to face mask oxygen  Post-op Assessment: Report given to PACU RN and Post -op Vital signs reviewed and stable  Post vital signs: Reviewed and stable  Complications: No apparent anesthesia complications

## 2014-05-04 NOTE — Interval H&P Note (Signed)
History and Physical Interval Note:  05/04/2014 8:21 AM  Charles Jenkins  has presented today for surgery, with the diagnosis of STEONOSIS L3-4  The various methods of treatment have been discussed with the patient and family. After consideration of risks, benefits and other options for treatment, the patient has consented to  Procedure(s): MICRO LUMBAR DECOMPRESSION L3-4/POSSIBLE L4-5 (N/A) as a surgical intervention .  The patient's history has been reviewed, patient examined, no change in status, stable for surgery.  I have reviewed the patient's chart and labs.  Questions were answered to the patient's satisfaction.     Timohty Renbarger C

## 2014-05-04 NOTE — Discharge Instructions (Signed)
Walk As Tolerated utilizing back precautions.  No bending, twisting, or lifting.  No driving for 2 weeks.   °Aquacel dressing may remain in place until follow up. May shower with aquacel dressing in place. If the dressing peels off or becomes saturated, you may remove aquacel dressing and place gauze and tape dressing which should be kept clean and dry and changed daily. Do not remove steri-strips if they are present. °See Dr. Karlena Luebke in office in 10 to 14 days. Begin taking aspirin 81mg per day starting 4 days after your surgery if not allergic to aspirin or on another blood thinner. °Walk daily even outside. Use a cane or walker only if necessary. °Avoid sitting on soft sofas. ° °

## 2014-05-04 NOTE — Anesthesia Preprocedure Evaluation (Addendum)
Anesthesia Evaluation  Patient identified by MRN, date of birth, ID band Patient awake    Reviewed: Allergy & Precautions, H&P , NPO status , Patient's Chart, lab work & pertinent test results  Airway Mallampati: II  TM Distance: >3 FB Neck ROM: Full    Dental  (+) Teeth Intact, Dental Advisory Given   Pulmonary neg pulmonary ROS, Current Smoker,  breath sounds clear to auscultation  Pulmonary exam normal       Cardiovascular hypertension, negative cardio ROS  Rhythm:Regular Rate:Normal     Neuro/Psych Depression  Neuromuscular disease negative neurological ROS  negative psych ROS   GI/Hepatic negative GI ROS, Neg liver ROS,   Endo/Other  Morbid obesity  Renal/GU negative Renal ROS  negative genitourinary   Musculoskeletal negative musculoskeletal ROS (+)   Abdominal   Peds  Hematology negative hematology ROS (+)   Anesthesia Other Findings   Reproductive/Obstetrics                             Anesthesia Physical  Anesthesia Plan  ASA: III  Anesthesia Plan: General   Post-op Pain Management:    Induction: Intravenous  Airway Management Planned: Oral ETT  Additional Equipment:   Intra-op Plan:   Post-operative Plan: Extubation in OR  Informed Consent: I have reviewed the patients History and Physical, chart, labs and discussed the procedure including the risks, benefits and alternatives for the proposed anesthesia with the patient or authorized representative who has indicated his/her understanding and acceptance.   Dental advisory given  Plan Discussed with: CRNA  Anesthesia Plan Comments:         Anesthesia Quick Evaluation

## 2014-05-04 NOTE — H&P (View-Only) (Signed)
Charles Jenkins is an 54 y.o. male.   Chief Complaint: back and leg pain HPI: The patient is a 54 year old male who presents today for follow up of their back. The patient is being followed for their back pain. They are now 18 month(s) out from lumbar decompression and 20 months out from the injury. Symptoms reported today include: pain. The patient states that they are doing poorly. Current treatment includes: relative rest, activity modification and pain medications. The following medication has been used for pain control: Charles Jenkins and Charles Jenkins and Charles Jenkins. The patient presents today following ESI L3-4 x 13 days.. Note for "Follow-up back": The patient is currently out of work. NCM Charles Jenkins.  Past Medical History  Diagnosis Date  . Hypertension   . Back pain   . Depression   . Ankylosing spondylitis   . Chronic right SI joint pain     Past Surgical History  Procedure Laterality Date  . Lumbar laminectomy/decompression microdiscectomy Left 10/28/2012    Procedure: MICRO LUMBAR DECOMPRESSION L4 - L5 and L5 - S1 2 LEVELS;  Surgeon: Charles Hai, MD;  Location: [facility];  Service: Orthopedics;  Laterality: Left;    No family history on file. Social History:  reports that Charles Jenkins has been smoking Cigars.  His smokeless tobacco use includes Snuff. Charles Jenkins reports that Charles Jenkins drinks alcohol. Charles Jenkins reports that Charles Jenkins does not use illicit drugs.  Allergies: No Known Allergies   (Not in a hospital admission)  No results found for this or any previous visit (from the past 48 hour(s)). No results found.  Review of Systems  Constitutional: Negative.   HENT: Negative.   Eyes: Negative.   Respiratory: Negative.   Cardiovascular: Negative.   Gastrointestinal: Negative.   Genitourinary: Negative.   Musculoskeletal: Positive for back pain.  Skin: Negative.   Neurological: Positive for focal weakness.  Psychiatric/Behavioral: Negative.     There were no vitals taken for this visit. Physical Exam   Constitutional: Charles Jenkins is oriented to person, place, and time. Charles Jenkins appears well-developed and well-nourished.  HENT:  Head: Normocephalic and atraumatic.  Eyes: Conjunctivae and EOM are normal. Pupils are equal, round, and reactive to light.  Neck: Normal range of motion. Neck supple.  Cardiovascular: Normal rate and regular rhythm.   Respiratory: Effort normal and breath sounds normal.  GI: Soft. Bowel sounds are normal.  Musculoskeletal:  On exam, SLR produces buttock and thigh pain on the left. Charles Jenkins has 5-/5 quad strength. Charles Jenkins has limited extension, relieved with forward flexion. Charles Jenkins has no pain down the back of his leg. Motor is 5/5 in EHL and plantar flexion. No DVT. No instability of hips, knees or ankles. His epidurals at 3-4 were technically successful.  Neurological: Charles Jenkins is alert and oriented to person, place, and time. Charles Jenkins has normal reflexes.  Skin: Skin is warm and dry.  Psychiatric: Charles Jenkins has a normal mood and affect.     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 Congenital and acquired. Patient has improved with formal supervised P.T. program, however Charles Jenkins had an exacerbation. This epidural did not help. Charles Jenkins's had a CT myelogram which demonstrated severe stenosis at 3-4. This is when Charles Jenkins's standing. Charles Jenkins also reports Charles Jenkins cannot do anything, can't walk. Charles Jenkins has pain and numbness down in particularly the left leg.  History of lumbar decompression at 5-1, absence of L5-S1 radiculopathy following decompression. Currently with stenosis which, by MRI, is moderately severe and by CT  myelogram. Moderate to severe stenosis at 3-4.  Given the persistence of his symptoms despite conservative treatment, we discussed 2 options: one, living with his symptoms like they are now, or decompression at 3-4 as we discussed previously. We discussed this in front of the patient and with Charles Jenkins, case  manager. Overnight in the hospital. Charles Jenkins weighs over 300. Charles Jenkins's on Methotrexate. Charles Jenkins cannot take Humira until we get his back situation taken care of. We discussed decompression.  Risks and benefits of this procedure were discussed with the patient including worsening of symptoms, no changes in symptoms, recurrent disc herniation, scar tissue, epidural fibrosis, damage to neurovascular structures, cerebral spinal fluid leak which would require repair or patching, DVT, PE, anesthetic complications, etc. We discussed the perioperative course, the hospitalization, and the need for postoperative rehabilitation and the time estimate for recovery. We also discussed the possibility of future surgery including repeat decompression, fusion. The patient was provided an illustrated handout which was discussed in detail. Appropriate anatomic models were used as well.  Continue Charles Jenkins, Robaxin, and Charles Jenkins. Charles Jenkins sees Charles Jenkins. I'd like her recommendations at least in the post op. period for 2 weeks coming off of the Methotrexate to reduce the risk of infection. Discussed this with Charles Jenkins, case manager. We will proceed accordingly. Afterwards, engage in P.T. and Charles Jenkins will probably, at [place] at 3 months post op., have permanent restrictions, and I don't feel Charles Jenkins will be able to return at his heavy duty capacity that we were attempting to obtain with this conservative treatment. I would not recommend a fusion. Charles Jenkins has minimal back pain. His MRI is less impressive than his upright extension myelogram.  Plan microlumbar decompression L3-4, possible L4-5  Jenkins, Charles Jenkins for Dr. Tonita Jenkins 04/25/2014, 9:14 AM

## 2014-05-04 NOTE — Brief Op Note (Signed)
05/04/2014  1:04 PM  PATIENT:  Charles Jenkins  54 y.o. male  PRE-OPERATIVE DIAGNOSIS:  SPINAL STEONOSIS L3-4  POST-OPERATIVE DIAGNOSIS:  SPINAL STEONOSIS L3-4  PROCEDURE:  Procedure(s): MICRO LUMBAR DECOMPRESSION L2-L3, L3-L4 (N/A)  SURGEON:  Surgeon(s) and Role:    * Johnn Hai, MD - Primary  PHYSICIAN ASSISTANT:   ASSISTANTS: [name]   ANESTHESIA:   general  EBL:  Total I/O In: 1000 [I.V.:1000] Out: 350 [Urine:200; Blood:150]  BLOOD ADMINISTERED:none  DRAINS: none   LOCAL MEDICATIONS USED:  MARCAINE     SPECIMEN:  No Specimen  DISPOSITION OF SPECIMEN:  N/A  COUNTS:  YES  TOURNIQUET:  * No tourniquets in log *  DICTATION: .Other Dictation: Dictation Number [id number]  PLAN OF CARE: Admit for overnight observation  PATIENT DISPOSITION:  PACU - hemodynamically stable.   Delay start of Pharmacological VTE agent (>24hrs) due to surgical blood loss or risk of bleeding: yes

## 2014-05-05 ENCOUNTER — Encounter (HOSPITAL_COMMUNITY): Payer: Self-pay | Admitting: Specialist

## 2014-05-05 DIAGNOSIS — M48061 Spinal stenosis, lumbar region without neurogenic claudication: Secondary | ICD-10-CM | POA: Diagnosis present

## 2014-05-05 DIAGNOSIS — M4806 Spinal stenosis, lumbar region: Secondary | ICD-10-CM | POA: Diagnosis not present

## 2014-05-05 MED ORDER — METHOTREXATE 2.5 MG PO TABS: 7.5000 mg | ORAL_TABLET | ORAL | Status: DC

## 2014-05-05 NOTE — Discharge Summary (Signed)
Physician Discharge Summary   Patient ID: Charles Jenkins MRN: [id number] DOB/AGE: 09-14-1960 54 y.o.  Admit date: 05/04/2014 Discharge date: 05/05/2014  Primary Diagnosis:   SPINAL STEONOSIS L3-4  Admission Diagnoses:  Past Medical History  Diagnosis Date  . Back pain   . Ankylosing spondylitis   . Chronic right SI joint pain   . Hypertension     "only in doctor's offices"  . Arthritis    Discharge Diagnoses:   Active Problems:   Spinal stenosis of lumbar region at multiple levels   Lumbar spinal stenosis  Procedure:  Procedure(s) (LRB): MICRO LUMBAR DECOMPRESSION L2-L3, L3-L4 (N/A)   Consults: None  HPI:  see H&P    Laboratory Data: Hospital Outpatient Visit on 04/27/2014  Component Date Value Ref Range Status  . MRSA, PCR 04/27/2014 NEGATIVE  NEGATIVE Final  . Staphylococcus aureus 04/27/2014 POSITIVE* NEGATIVE Final   Comment:        The Xpert SA Assay (FDA approved for NASAL specimens in patients over 40 years of age), is one component of a comprehensive surveillance program.  Test performance has been validated by [employer] for patients greater than or equal to 61 year old. It is not intended to diagnose infection nor to guide or monitor treatment.   . Sodium 04/27/2014 140  135 - 145 mmol/L Final   Please note change in reference range.  . Potassium 04/27/2014 4.2  3.5 - 5.1 mmol/L Final   Please note change in reference range.  . Chloride 04/27/2014 105  96 - 112 mEq/L Final  . CO2 04/27/2014 27  19 - 32 mmol/L Final  . Glucose, Bld 04/27/2014 130* 70 - 99 mg/dL Final  . BUN 63/14/9702 14  6 - 23 mg/dL Final  . Creatinine, Ser 04/27/2014 0.81  0.50 - 1.35 mg/dL Final  . Calcium 63/78/5885 9.1  8.4 - 10.5 mg/dL Final  . GFR calc non Af Amer 04/27/2014 >90  >90 mL/min Final  . GFR calc Af Amer 04/27/2014 >90  >90 mL/min Final   Comment: (NOTE) The eGFR has been calculated using the CKD EPI equation. This calculation has not been validated in  all clinical situations. eGFR's persistently <90 mL/min signify possible Chronic Kidney Disease.   . Anion gap 04/27/2014 8  5 - 15 Final  . WBC 04/27/2014 7.5  4.0 - 10.5 K/uL Final  . RBC 04/27/2014 4.98  4.22 - 5.81 MIL/uL Final  . Hemoglobin 04/27/2014 14.0  13.0 - 17.0 g/dL Final  . HCT 02/77/4128 45.1  39.0 - 52.0 % Final  . MCV 04/27/2014 90.6  78.0 - 100.0 fL Final  . MCH 04/27/2014 28.1  26.0 - 34.0 pg Final  . MCHC 04/27/2014 31.0  30.0 - 36.0 g/dL Final  . RDW 78/67/6720 14.3  11.5 - 15.5 % Final  . Platelets 04/27/2014 299  150 - 400 K/uL Final   No results for input(s): HGB in the last 72 hours. No results for input(s): WBC, RBC, HCT, PLT in the last 72 hours. No results for input(s): NA, K, CL, CO2, BUN, CREATININE, GLUCOSE, CALCIUM in the last 72 hours. No results for input(s): LABPT, INR in the last 72 hours.  X-Rays:Dg Chest 2 View  04/27/2014   CLINICAL DATA:  Hypertension.  EXAM: CHEST  2 VIEW  COMPARISON:  01/06/2012.  FINDINGS: The heart size and mediastinal contours are within normal limits. Both lungs are clear. The visualized skeletal structures are unremarkable.  IMPRESSION: No active cardiopulmonary disease.   Electronically  Signed   ByMarcello Moores  Register   On: 04/27/2014 12:20   Dg Lumbar Spine 2-3 Views  04/27/2014   CLINICAL DATA:  Lumbar spine surgery.  Preoperative exam.  EXAM: LUMBAR SPINE - 2-3 VIEW  COMPARISON:  CT 06/02/2013.  Myelogram 06/02/2013.  FINDINGS: The lumbar vertebrae are numbered with the lowest segment it appearing vertebral lateral view as L5. Diffuse degenerative change present. Mild compression of T11 and T12 noted. Bony mineralization normal. Normal alignment.  IMPRESSION: Diffuse degenerative change. Mild T11 and T12 compressions, similar findings on prior study 2/ 07/2013. No acute abnormality.   Electronically Signed   By: Marcello Moores  Register   On: 04/27/2014 12:59   Dg Spine Portable 1 View  05/04/2014   CLINICAL DATA:  Intraoperative  localization for lumbar spine surgery.  EXAM: PORTABLE SPINE - 1 VIEW  COMPARISON:  Prior intraoperative image number 2 obtained earlier today.  FINDINGS: Cross-table lateral radiograph shows probes overlying the posterior elements of the L3 and L4 vertebra.  IMPRESSION: Localization of L3-4.   Electronically Signed   By: Earle Gell M.D.   On: 05/04/2014 12:44   Dg Spine Portable 1 View  05/04/2014   CLINICAL DATA:  Lumbar decompression. Request to number spinous processes.  EXAM: PORTABLE SPINE - 1 VIEW  COMPARISON:  05/04/2014 at 1101 hr  FINDINGS: A single portable cross-table lateral intraoperative radiograph of the lumbar spine is provided. Surgical instruments project over the L3 and L4 spinous processes.  IMPRESSION: Intraoperative localization as above.   Electronically Signed   By: Logan Bores   On: 05/04/2014 11:47   Dg Spine Portable 1 View  05/04/2014   CLINICAL DATA:  Lumbar spine surgery.  EXAM: PORTABLE SPINE - 1 VIEW  COMPARISON:  04/27/2014 lumbar spine radiographs  FINDINGS: A single portable cross-table lateral intraoperative radiograph of the lumbar spine is provided. 2 metallic needles are present. The more superior needle tip projects posterior to the inferior aspect of the L1 spinous process. The second, more inferior needle is present in the more superficial soft tissues of the lower back directed towards the L3-4 interspinous space.  IMPRESSION: Intraoperative localization during lumbar spine surgery.   Electronically Signed   By: Logan Bores   On: 05/04/2014 11:29    EKG: Orders placed or performed during the hospital encounter of 04/27/14  . EKG 12-Lead  . EKG 12-Lead     Hospital Course: Patient was admitted to [facility] and taken to the OR and underwent the above state procedure without complications.  Patient tolerated the procedure well and was later transferred to the recovery room and then to the orthopaedic floor for postoperative care.  They were given PO  and IV analgesics for pain control following their surgery.  They were given 24 hours of postoperative antibiotics.   PT was consulted postop to assist with mobility and transfers.  The patient was allowed to be WBAT with therapy and was taught back precautions. Discharge planning was consulted to help with postop disposition and equipment needs.  Patient had a good night on the evening of surgery and started to get up OOB with therapy on day one. Patient was seen in rounds and was ready to go home on day one.  They were given discharge instructions and dressing directions.  They were instructed on when to follow up in the office with Dr. Tonita Cong.   Diet: Regular diet Activity:WBAT Follow-up:in 14 days Disposition - Home Discharged Condition: good   Discharge Instructions  Call MD / Call 911    Complete by:  As directed   If you experience chest pain or shortness of breath, CALL 911 and be transported to the hospital emergency room.  If you develope a fever above 101 F, pus (white drainage) or increased drainage or redness at the wound, or calf pain, call your surgeon's office.     Constipation Prevention    Complete by:  As directed   Drink plenty of fluids.  Prune juice may be helpful.  You may use a stool softener, such as Colace (over the counter) 100 mg twice a day.  Use MiraLax (over the counter) for constipation as needed.     Diet - low sodium heart healthy    Complete by:  As directed      Increase activity slowly as tolerated    Complete by:  As directed             Medication List    STOP taking these medications        oxyCODONE-acetaminophen 10-325 MG per tablet  Commonly known as:  PERCOCET      TAKE these medications        docusate sodium 100 MG capsule  Commonly known as:  COLACE  Take 1 capsule (100 mg total) by mouth 2 (two) times daily as needed for mild constipation.     folic acid 1 MG tablet  Commonly known as:  FOLVITE  Take 1 mg by mouth daily.      gabapentin 300 MG capsule  Commonly known as:  NEURONTIN  Take 900 mg by mouth 3 (three) times daily.     methocarbamol 500 MG tablet  Commonly known as:  ROBAXIN  Take 1 tablet (500 mg total) by mouth 3 (three) times daily between meals as needed for muscle spasms.     methotrexate 2.5 MG tablet  Commonly known as:  RHEUMATREX  Take 3 tablets (7.5 mg total) by mouth once a week. Caution:Chemotherapy. Protect from light. Resume one week post-op.     Oxycodone HCl 10 MG Tabs  1-2 po q4-6 prn pain           Follow-up Information    Follow up with Johnn Hai, MD.   Specialty:  Orthopedic Surgery   Contact information:   [address] [place] [address] [phone]       Signed: Lacie Draft, PA-C Orthopaedic Surgery 05/05/2014, 10:37 AM

## 2014-05-05 NOTE — Anesthesia Postprocedure Evaluation (Signed)
  Anesthesia Post-op Note  Patient: Charles Jenkins  Procedure(s) Performed: Procedure(s) (LRB): MICRO LUMBAR DECOMPRESSION L2-L3, L3-L4 (N/A)  Patient Location: PACU  Anesthesia Type: General  Level of Consciousness: awake and alert   Airway and Oxygen Therapy: Patient Spontanous Breathing  Post-op Pain: mild  Post-op Assessment: Post-op Vital signs reviewed, Patient's Cardiovascular Status Stable, Respiratory Function Stable, Patent Airway and No signs of Nausea or vomiting  Last Vitals:  Filed Vitals:   05/05/14 0225  BP: 110/57  Pulse: 70  Temp: 36.5 C  Resp: 16    Post-op Vital Signs: stable   Complications: No apparent anesthesia complications

## 2014-05-05 NOTE — Progress Notes (Signed)
OT Cancellation Note  Patient Details Name: Charles Jenkins MRN: [id number] DOB: 29-Apr-1961   Cancelled Treatment:    Reason Eval/Treat Not Completed: OT screened, no needs identified, will sign off. Pt states he has been through back surgery before and he reports not needing OT. His wife is present and verbalizes understanding of precautions and states she can assist PRN. He has all DME. Will sign off.  [name], [place] 05/05/2014, 10:54 AM

## 2014-05-05 NOTE — Op Note (Signed)
NAMEJIMEL, Charles Jenkins               ACCOUNT NO.:  [id number]  MEDICAL RECORD NO.:  [id number]  LOCATION:  4010                         FACILITY:  [facility]  PHYSICIAN:  Susa Day, M.D.    DATE OF BIRTH:  August 25, 1960  DATE OF PROCEDURE:  05/04/2014 DATE OF DISCHARGE:                              OPERATIVE REPORT   PREOPERATIVE DIAGNOSIS:  Spinal stenosis at L3-L4.  Morbid obesity with a BMI of 49.6.  POSTOPERATIVE DIAGNOSIS:  Spinal stenosis at L3-L4, L2-3.  Morbid obesity with a BMI of 49.6.  PROCEDURES PERFORMED: 1. Microlumbar decompression L3-4 and L2-3. 2. Laminectomy, L4. 3. Foraminotomies of L4 and L5 bilaterally.  ANESTHESIA:  General.  ASSISTANT:  Cleophas Dunker, PA.  HISTORY:  A 54 year old who has had neurogenic claudication secondary to severe spinal stenosis, by myelogram at L3-4.  The patient had a left lower extremity radicular pain, refractory to conservative treatment. He was indicated for decompression, myelogram indicating severe stenosis extending up to the neural arch of L4.  We discussed decompression at 3- 4 up to 2-3.  Risk and benefits discussed including bleeding, infection, damage to neurovascular structures, DVT, PE, anesthetic complications, etc.  The patient's elevated BMI was considered at increased risk as well.  PROCEDURE IN DETAIL:  With the patient in supine position, after induction of adequate general anesthesia, 900 clindamycin and 3 g Kefzol, he was placed prone on the Wilson frame.  All areas were well padded.  Foley to gravity.  Care was taken again to get him well positioned due to a size, extra help was used.  The lumbar region was prepped and draped in usual sterile fashion.  Two 18-gauge spinal needles were utilized and localized to 3-4 interspace, confirmed with x- ray.  Incision was made from the spinous process of 4-2.  Subcutaneous tissue was dissected fairly deep.  Electrocautery was utilized to achieve hemostasis.   Dorsolumbar fascia was identified and divided on either side of the interspinous ligament in line with the skin incision. The paraspinous musculature was infiltrated with 0.25% Marcaine with epinephrine.  McCullough retractor was placed.  We used a 120 blade. Operating microscope was draped and brought on the surgical field.  I used a Leksell rongeur to remove the spinous process of 3, partial of 4. We began our decompression.  We used a straight curette to detach the ligamentum flavum from the caudad edge of 3 using a combination of 2 and 3 mm Kerrison to perform bilateral hemilaminotomies and laminectomy of 3.  Severe stenosis was noted at 3-4.  From both sides on the operating room table, I decompressed the lateral recesses to the medial border of the pedicle.  Hypertrophic facet and ligamentum were noted.  We performed foraminotomies of L4 and L3 bilaterally.  Fairly severe stenosis on both sides into the foramen of 4 and 3.  Bipolar electrocautery was utilized to achieve hemostasis.  There was attenuated thecal sac over the area of the decompression consistent with a severe stenosis seen on the myelogram.  We protected the neural elements at all times with a thrombin-soaked Gelfoam and neuro patties.  We continued after removing the neural arch of 3 and some of the ligamentum  of 2-3, we were then able to pass a neuro probe to the pedicle of above 3 and to below 4.  There was central and lateral recess stenosis particularly noted at 4-5.  After the foraminotomies in the decompression, placed bone wax on the cancellous surfaces.  Copiously irrigated the wound.  No evidence of CSF leakage or active bleeding.  We obtained a radiograph of the neuro probe at the foramen of 4 just above 3.  Next, inspection revealed no evidence of CSF leakage or active bleeding.  I therefore placed thrombin-soaked Gelfoam in the laminotomy defect.  We removed the McCullough retractor and closed the  dorsolumbar fascia after placing thrombin-soaked Gelfoam in the laminotomy defect.  Closed the dorsolumbar fascia with 1 Vicryl and a running V-Loc, subcu with multiple wounds with 2 layers and then the skin with staples.  We irrigated throughout.  Sterile dressing applied.  Carefully placed supine on hospital bed, extubated without difficulty, and transported to the recovery room in satisfactory condition.  The patient tolerated the procedure well.  No complications.  Blood loss 150.  Again, patient's great difficulty increased due to the patient's elevated BMI.     Susa Day, M.D.     Geralynn Rile  D:  05/04/2014  T:  05/05/2014  Job:  [id number]

## 2014-05-05 NOTE — Evaluation (Signed)
Physical Therapy Evaluation Patient Details Name: Charles Jenkins MRN: [id number] DOB: 1961-02-13 Today's Date: 05/05/2014   History of Present Illness  MICRO LUMBAR DECOMPRESSION L2-L3, L3-L4   Clinical Impression  Pt s/p microlumbar decompression presents with functional mobility limitations 2* post op pain and back precautions.  Pt mobilizing at sup to IND level and plans d.c home with family assist.    Follow Up Recommendations No PT follow up    Equipment Recommendations  None recommended by PT    Recommendations for Other Services       Precautions / Restrictions Precautions Precautions: Back Restrictions Weight Bearing Restrictions: No      Mobility  Bed Mobility               General bed mobility comments: NT - pt OOB on arrival.  Pt able to describe technique for in/out bed.  Pt declines to attempt  Transfers Overall transfer level: Modified independent Equipment used: None             General transfer comment: min cue for use of UEs  Ambulation/Gait Ambulation/Gait assistance: Independent Ambulation Distance (Feet): 450 Feet Assistive device: None Gait Pattern/deviations: Step-through pattern;Wide base of support Gait velocity: mod pace      Stairs            Wheelchair Mobility    Modified Rankin (Stroke Patients Only)       Balance Overall balance assessment: No apparent balance deficits (not formally assessed)                                           Pertinent Vitals/Pain Pain Assessment: 0-10 Pain Score: 4  Pain Location: Back Pain Descriptors / Indicators: Sore Pain Intervention(s): Limited activity within patient's tolerance;Monitored during session;Premedicated before session    Home Living Family/patient expects to be discharged to:: Private residence Living Arrangements: Spouse/significant other Available Help at Discharge: Family Type of Home: House Home Access: Level entry     Home  Layout: One level Home Equipment: Bedside commode;Cane - single point;Walker - standard      Prior Function Level of Independence: Independent               Hand Dominance        Extremity/Trunk Assessment   Upper Extremity Assessment: Overall WFL for tasks assessed           Lower Extremity Assessment: Overall WFL for tasks assessed      Cervical / Trunk Assessment: Normal  Communication   Communication: No difficulties  Cognition Arousal/Alertness: Awake/alert Behavior During Therapy: WFL for tasks assessed/performed Overall Cognitive Status: Within Functional Limits for tasks assessed                      General Comments      Exercises        Assessment/Plan    PT Assessment Patent does not need any further PT services  PT Diagnosis Difficulty walking   PT Problem List    PT Treatment Interventions     PT Goals (Current goals can be found in the Care Plan section) Acute Rehab PT Goals Patient Stated Goal: Decreased pain PT Goal Formulation: With patient Time For Goal Achievement: 05/10/14 Potential to Achieve Goals: Good    Frequency     Barriers to discharge        Co-evaluation  End of Session   Activity Tolerance: Patient tolerated treatment well Patient left: in chair;with call bell/phone within reach;with family/visitor present Nurse Communication: Mobility status    Functional Assessment Tool Used: clinical judgement Functional Limitation: Mobility: Walking and moving around Mobility: Walking and Moving Around Current Status ([id number]): At least 1 percent but less than 20 percent impaired, limited or restricted Mobility: Walking and Moving Around Goal Status [phone]): At least 1 percent but less than 20 percent impaired, limited or restricted Mobility: Walking and Moving Around Discharge Status [phone]): At least 1 percent but less than 20 percent impaired, limited or restricted    Time: 1005-1020 PT  Time Calculation (min) (ACUTE ONLY): 15 min   Charges:   PT Evaluation $Initial PT Evaluation Tier I: 1 Procedure     PT G Codes:   PT G-Codes **NOT FOR INPATIENT CLASS** Functional Assessment Tool Used: clinical judgement Functional Limitation: Mobility: Walking and moving around Mobility: Walking and Moving Around Current Status ([id number]): At least 1 percent but less than 20 percent impaired, limited or restricted Mobility: Walking and Moving Around Goal Status [phone]): At least 1 percent but less than 20 percent impaired, limited or restricted Mobility: Walking and Moving Around Discharge Status [phone]): At least 1 percent but less than 20 percent impaired, limited or restricted    Charles Jenkins 05/05/2014, 11:46 AM

## 2014-05-05 NOTE — Progress Notes (Signed)
Subjective: 1 Day Post-Op Procedure(s) (LRB): MICRO LUMBAR DECOMPRESSION L2-L3, L3-L4 (N/A) Patient reports pain as mild.  Reports leg pain improved. Back pain well controlled. He is up walking with PT during our AM rounds. Reports he is feeling well and would like to go home. Voiding without difficulty. No other c/o.  Objective: Vital signs in last 24 hours: Temp:  [97.5 F (36.4 C)-98.9 F (37.2 C)] 97.7 F (36.5 C) (01/07 0225) Pulse Rate:  [70-95] 70 (01/07 0225) Resp:  [10-18] 16 (01/07 0225) BP: (110-157)/(57-78) 110/57 mmHg (01/07 0225) SpO2:  [95 %-100 %] 95 % (01/07 0225)  Intake/Output from previous day: 01/06 0701 - 01/07 0700 In: 4871.3 [P.O.:1540; I.V.:3281.3; IV Piggyback:50] Out: 2205 [Urine:2055; Blood:150] Intake/Output this shift: Total I/O In: 360 [P.O.:360] Out: 50 [Urine:50]  No results for input(s): HGB in the last 72 hours. No results for input(s): WBC, RBC, HCT, PLT in the last 72 hours. No results for input(s): NA, K, CL, CO2, BUN, CREATININE, GLUCOSE, CALCIUM in the last 72 hours. No results for input(s): LABPT, INR in the last 72 hours.  Neurologically intact ABD soft Neurovascular intact Sensation intact distally Intact pulses distally Dorsiflexion/Plantar flexion intact Incision: dressing C/D/I and no drainage No cellulitis present Compartment soft no calf pain or sign of DVT  Assessment/Plan: 1 Day Post-Op Procedure(s) (LRB): MICRO LUMBAR DECOMPRESSION L2-L3, L3-L4 (N/A) Advance diet Up with therapy D/C IV fluids  Reviewed D/C instructions, Lspine precautions D/C home today Follow up in 2 weeks for staple removal  Charles Jenkins M. 05/05/2014, 10:28 AM

## 2014-05-05 NOTE — Care Management Note (Signed)
    Page 1 of 1   05/05/2014     10:27:56 AM CARE MANAGEMENT NOTE 05/05/2014  Patient:  Charles Jenkins   Account Number:  [id number]  Date Initiated:  05/05/2014  Documentation initiated by:  [facility]  Subjective/Objective Assessment:   adm: MICRO LUMBAR DECOMPRESSION L2-L3, L3-L4 (N/A)     Action/Plan:   discharge planning   Anticipated DC Date:  05/05/2014   Anticipated DC Plan:  [place]  CM consult      PAC Choice  NA   Choice offered to / List presented to:     DME arranged  NA      DME agency  NA     [place] arranged  NA      Status of service:  Completed, signed off Medicare Important Message given?   (If response is "NO", the following Medicare IM given date fields will be blank) Date Medicare IM given:   Medicare IM given by:   Date Additional Medicare IM given:   Additional Medicare IM given by:    Discharge Disposition:  HOME/SELF CARE  Per UR Regulation:    If discussed at Long Length of Stay Meetings, dates discussed:    Comments:  05/05/13 08:00 CM met with pt in room to discuss home health needs.  Pt states he has had this procedure in the past and has all the DME he needs.  NO PT/OT followup recc.  No other CM needs were communicated. Mariane Masters, BSN, CM [phone].

## 2014-05-05 NOTE — Progress Notes (Signed)
CSW consulted for SNF placement. PN reviewed. Pt plans to return home following hospital d/c. OT reports no follow up needed. CSW signing off.  Werner Lean LCSW [phone]

## 2014-05-05 NOTE — Plan of Care (Signed)
Problem: Consults Goal: Diagnosis - Spinal Surgery Outcome: Completed/Met Date Met:  05/05/14 Lumbar Laminectomy (Complex) with Decompression of L2-L3, L3-L4

## 2014-07-02 IMAGING — CT CT L SPINE W/ CM
4 of 11 series · 12 of 33 positions shown, 14 images · non-contrast
Comparison: Lumbar spine MRI 05/11/2013 from [REDACTED]

CLINICAL DATA: Status post L5-S1 decompression 6 months ago. Low
back and left anterior thigh pain and numbness.

EXAM:
LUMBAR MYELOGRAM
TECHNIQUE: Contiguous axial images were obtained through the Lumbar spine after
the intrathecal infusion of infusion. Coronal and sagittal
reconstructions were obtained of the axial image sets.

[Series 2: l spine bone · axial · 0.27mm/px · z∈[-348,-102]mm · 3 of 99 slices shown, 4 images]
[im 1/99  soft-tissue]
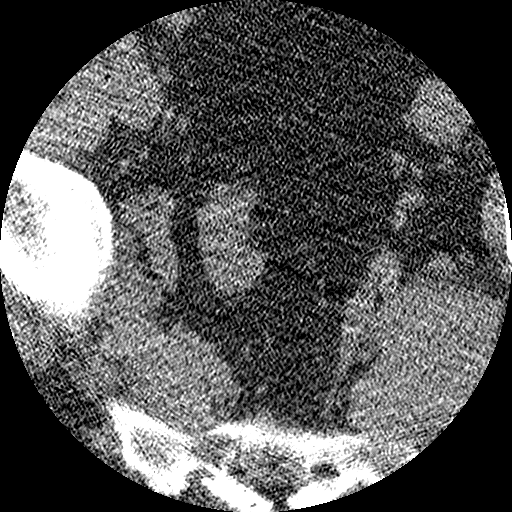
[im 1/99  bone]
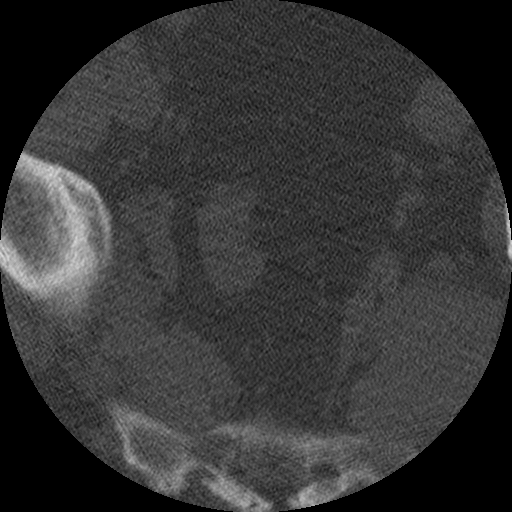
[im 50/99  bone]
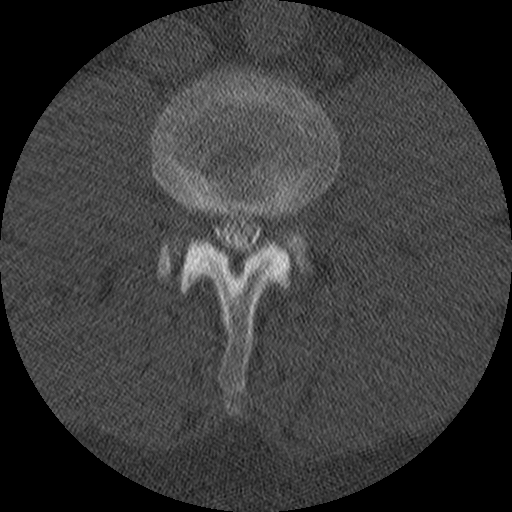
[im 99/99  bone]
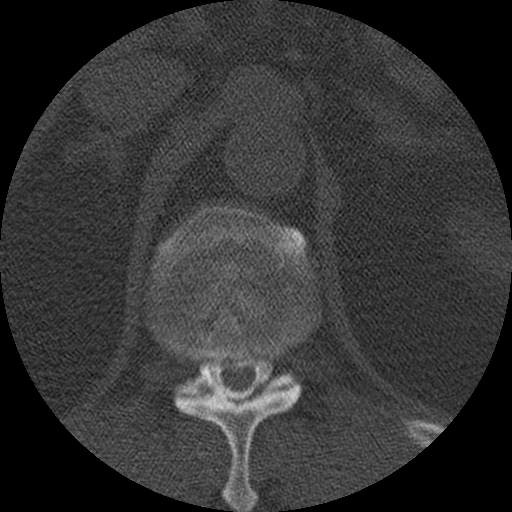

[Series 3: l spine soft · axial · 0.27mm/px · z∈[-268,-185]mm · 2 of 99 slices shown]
[im 33/99  soft-tissue]
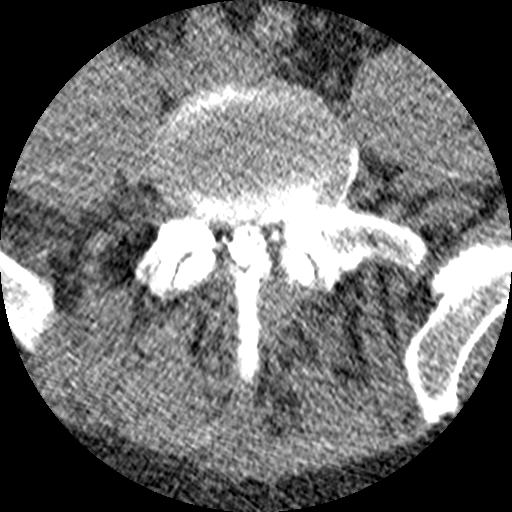
[im 66/99  soft-tissue]
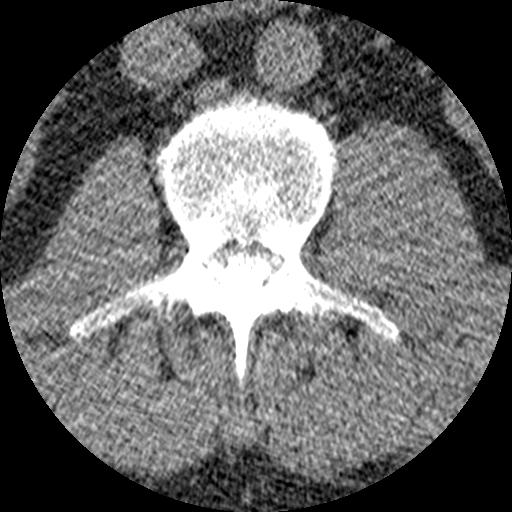

[Series 400: cor upper · coronal · 0.49mm/px · 2 of 59 slices shown]
[im 17/59  bone]
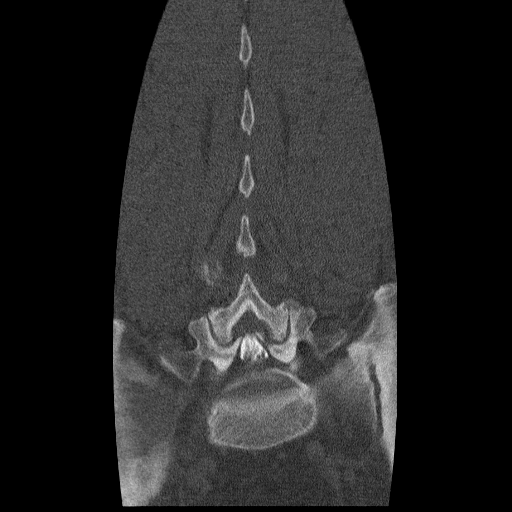
[im 33/59  bone]
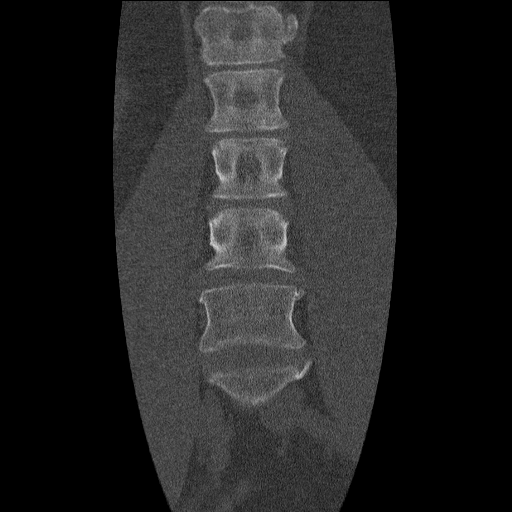

[Series 402: sag · sagittal · 0.49mm/px · 5 of 62 slices shown, 6 images]
[im 21/62  bone]
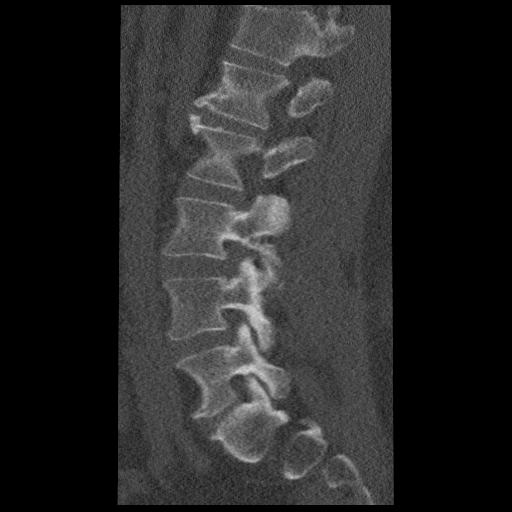
[im 26/62  bone]
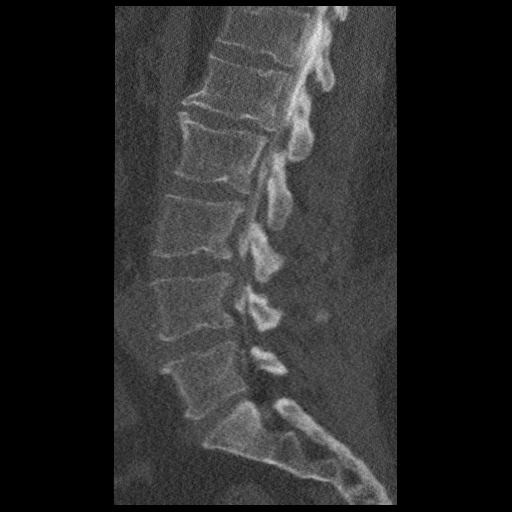
[im 31/62  soft-tissue]
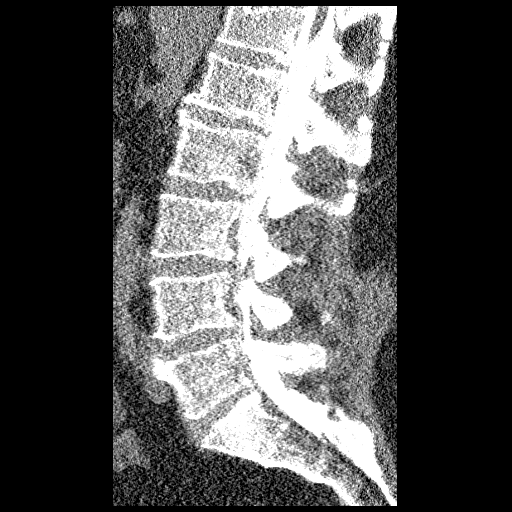
[im 31/62  bone]
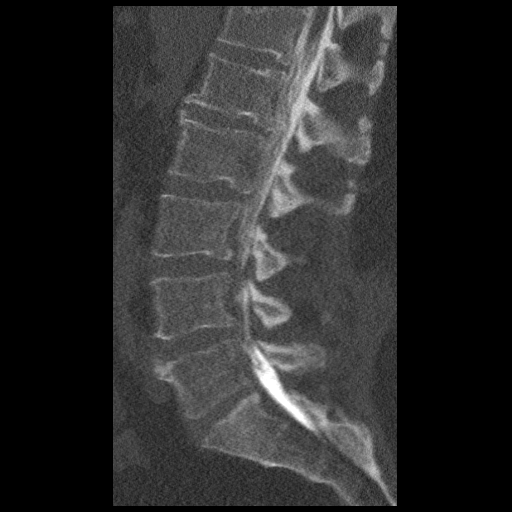
[im 36/62  bone]
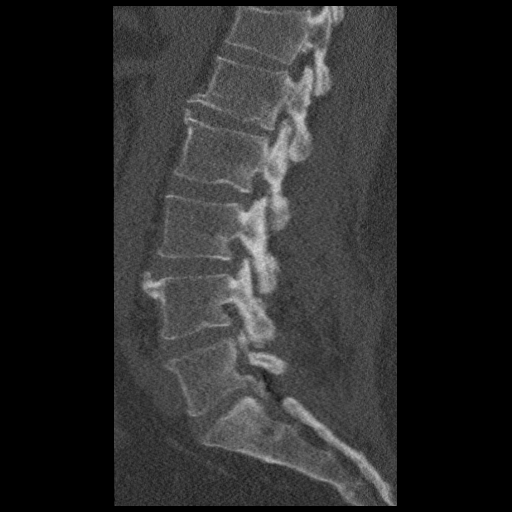
[im 41/62  bone]
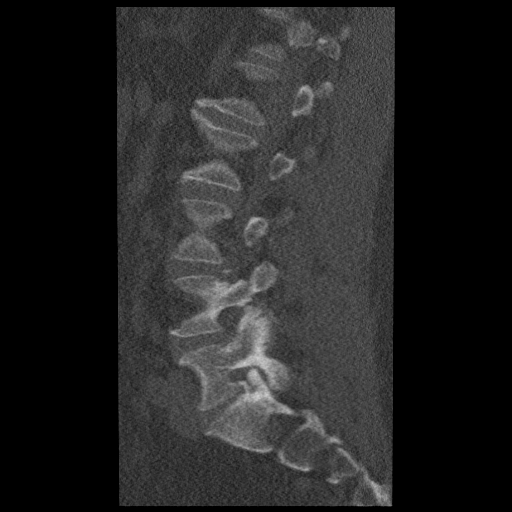

[12 of 33 positions shown; findings below may reference images not displayed]

FLUOROSCOPY TIME:  1 min 7 seconds

PROCEDURE:
After thorough discussion of risks and benefits of the procedure
including bleeding, infection, injury to nerves, blood vessels,
adjacent structures as well as headache and CSF leak, written and
oral informed consent was obtained. Consent was obtained by Dr.
Marzephine Tabaluyan. Time out form was completed.

Patient was positioned prone on the fluoroscopy table. Local
anesthesia was provided with 1% lidocaine without epinephrine after
prepped and draped in the usual sterile fashion. Puncture was
performed at L4-5 using a 5 inch 22-gauge spinal needle via right
paramedian approach. Using a single pass through the dura, the
needle was placed within the thecal sac, with return of clear CSF.
15 mL of 2mnipaque-VT8 was injected into the thecal sac, with normal
opacification of the nerve roots and cauda equina consistent with
free flow within the subarachnoid space.

I personally performed the lumbar puncture and administered the
intrathecal contrast. I also personally supervised acquisition of
the myelogram images.
FINDINGS: LUMBAR MYELOGRAM FINDINGS:

There is no listhesis. There is no evidence of dynamic instability
on upright flexion or extension views. There is moderate to severe
spinal stenosis at L3-4 which appears worse with standing.

CT LUMBAR MYELOGRAM FINDINGS:

There is no listhesis. Vertebral body heights are preserved. Mild
thickening of the nerve roots of the cauda equina in the mid and
lower lumbar spine may be secondary to stenosis and/or prior
surgery. The conus medullaris terminates at L1. There is diffuse
narrowing of the lumbar spinal canal due to congenitally short
pedicles. Visualized paraspinal soft tissues are unremarkable.

T12 L1:  Negative.

L1-2: Mild disc bulge and endplate spurring result in mild bilateral
neural foraminal narrowing. No spinal canal stenosis.

L2-3: Mild disc bulge and endplate spurring resulting in mild
bilateral neural foraminal narrowing. No spinal canal stenosis.

L3-4: Disc bulge, facet and ligamentum flavum hypertrophy, and
congenitally short pedicles result in moderate to severe spinal
stenosis and moderate bilateral neural foraminal stenosis.

L4-5: Mild disc bulge, facet and ligamentum flavum hypertrophy, and
congenitally short pedicles result in mild spinal stenosis and
moderate left and mild-to-moderate right neural foraminal stenosis.

L5-S1: Left foraminal disc protrusion results in moderate left
neural foraminal stenosis without spinal canal stenosis.
IMPRESSION: LUMBAR MYELOGRAM IMPRESSION:

No listhesis. Moderate to severe spinal stenosis at L3-4 which
appears worse with standing.

CT LUMBAR MYELOGRAM IMPRESSION:

1. Moderate to severe spinal stenosis at L3-4 due to congenitally
short pedicles, disc bulge, and posterior element hypertrophy.
Moderate bilateral neural foraminal stenosis.
2. Mild spinal stenosis and left greater than right neural foraminal
stenosis at L4-5.
3. Moderate left neural foraminal stenosis at L5-S1 due to a
foraminal disc protrusion.

## 2014-07-02 IMAGING — RF DG MYELOGRAM LUMBAR
13 of 22 series · 13 of 22 positions shown · non-contrast
Comparison: Lumbar spine MRI 05/11/2013 from [REDACTED]

CLINICAL DATA: Status post L5-S1 decompression 6 months ago. Low
back and left anterior thigh pain and numbness.

EXAM:
LUMBAR MYELOGRAM
TECHNIQUE: Contiguous axial images were obtained through the Lumbar spine after
the intrathecal infusion of infusion. Coronal and sagittal
reconstructions were obtained of the axial image sets.

[Series 1: (hospital) · 1 of 1 slices shown]
[im 1/1]
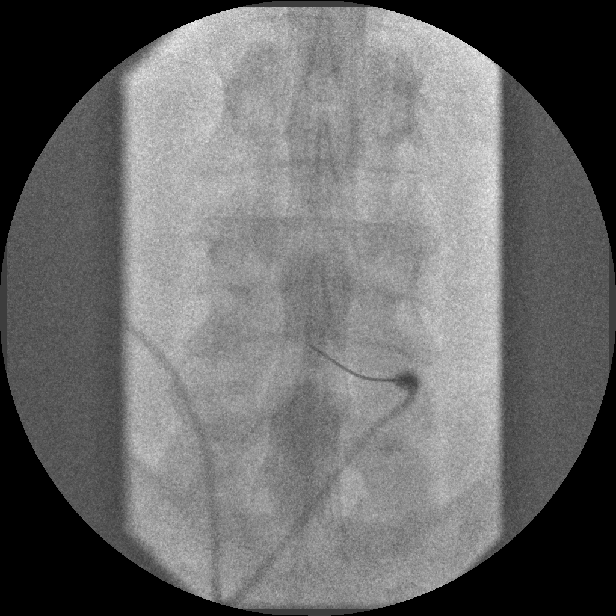

[Series 3: myelogram  white · 1 of 1 slices shown (1 of 8)]
[im 1/1]
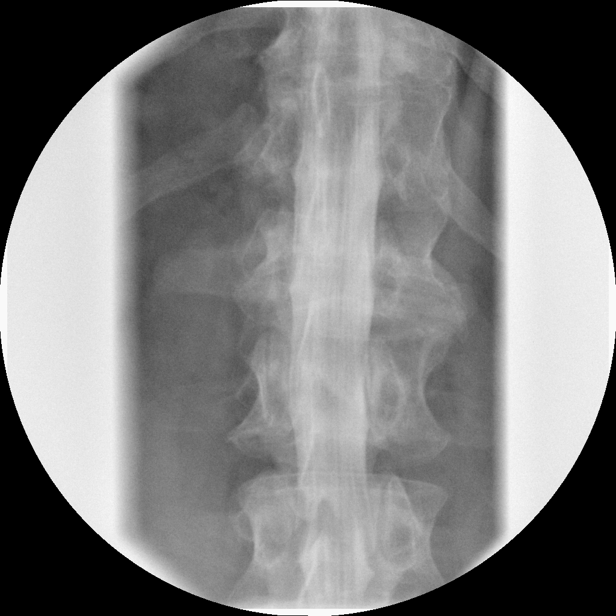

[Series 5: myelogram  white · 1 of 1 slices shown (2 of 8)]
[im 1/1]
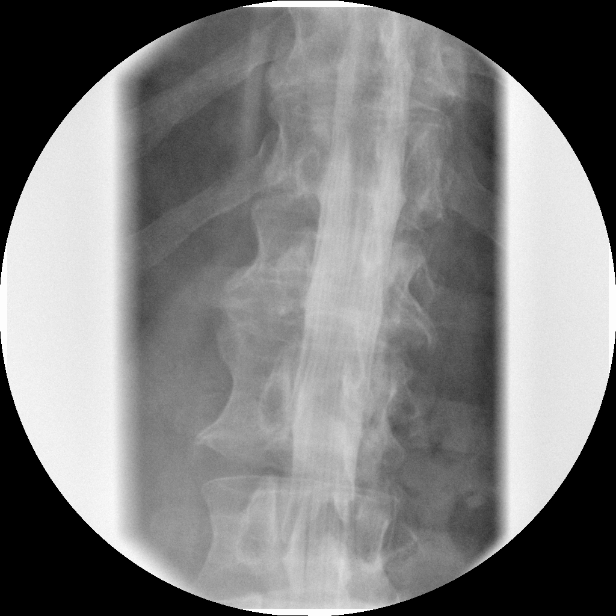

[Series 6: myelogram  white · 1 of 1 slices shown (3 of 8)]
[im 1/1]
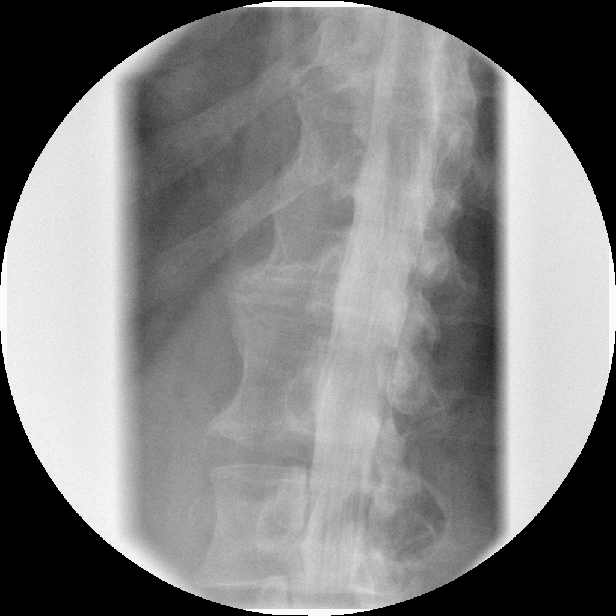

[Series 8: myelogram  white · 1 of 1 slices shown (4 of 8)]
[im 1/1]
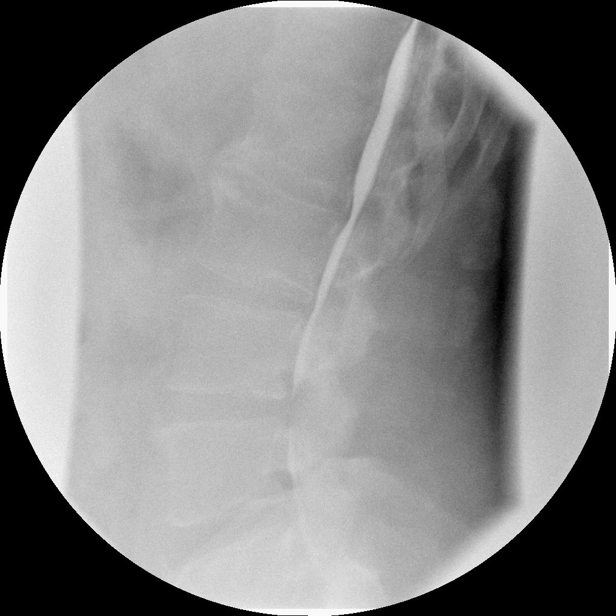

[Series 10: myelogram  white · 1 of 1 slices shown (5 of 8)]
[im 1/1]
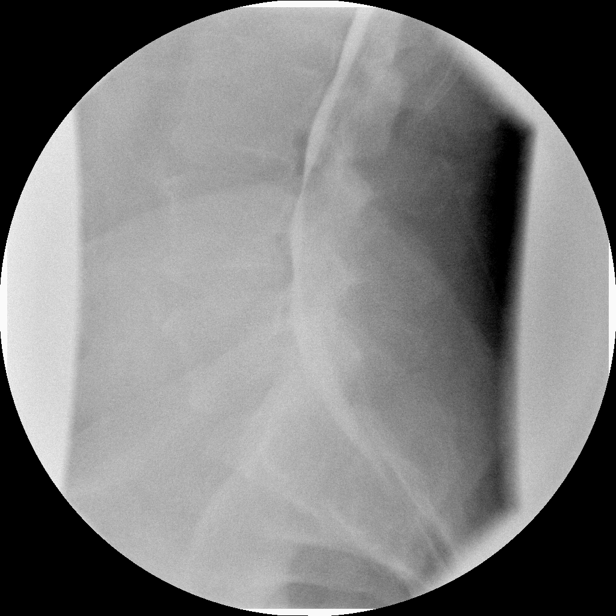

[Series 12: myelogram  white · 1 of 1 slices shown (6 of 8)]
[im 1/1]
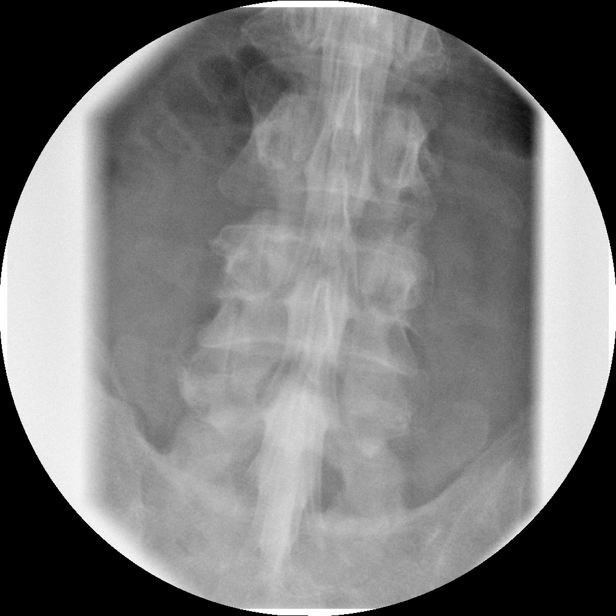

[Series 13: myelogram  white · 1 of 1 slices shown (7 of 8)]
[im 1/1]
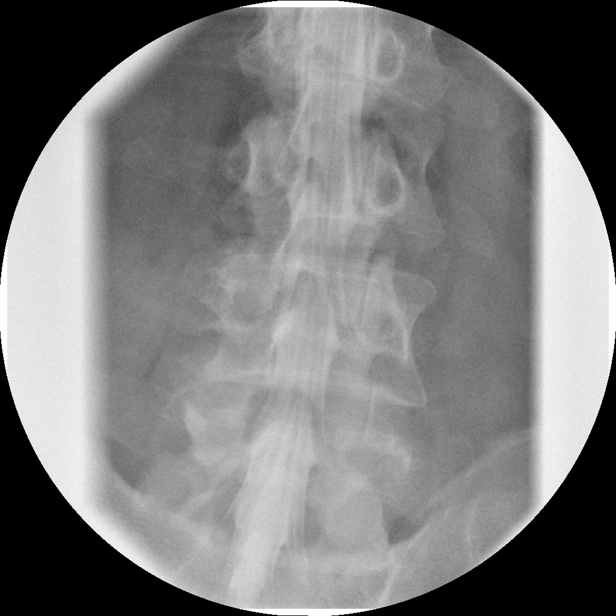

[Series 15: myelogram  white · 1 of 1 slices shown (8 of 8)]
[im 1/1]
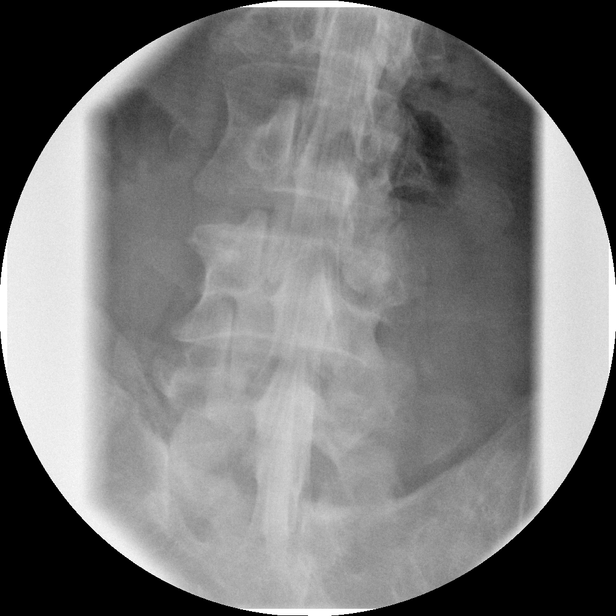

[Series 1001: view not recorded · 0.20mm/px · 1 of 1 slices shown (1 of 4)]
[im 1/1]
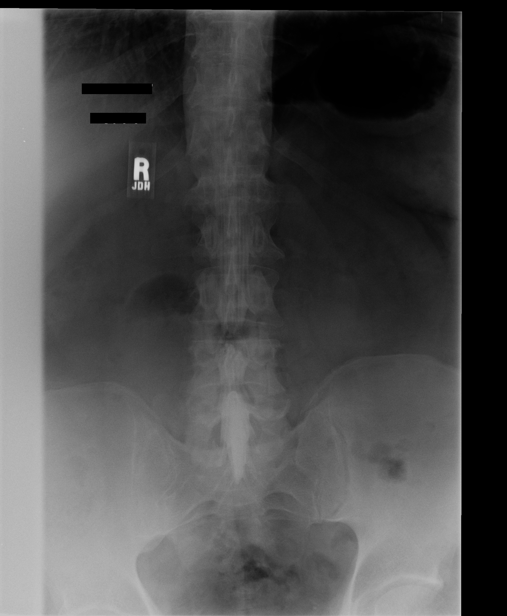

[Series 1002: view not recorded · 0.20mm/px · 1 of 1 slices shown (2 of 4)]
[im 1/1]
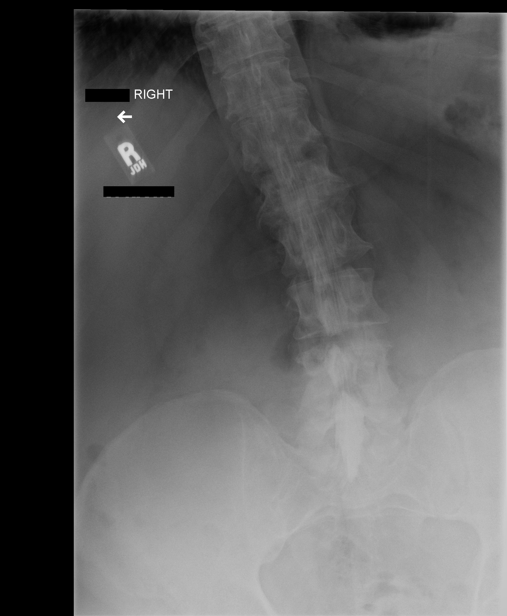

[Series 1004: view not recorded · 0.20mm/px · 1 of 1 slices shown (3 of 4)]
[im 1/1]
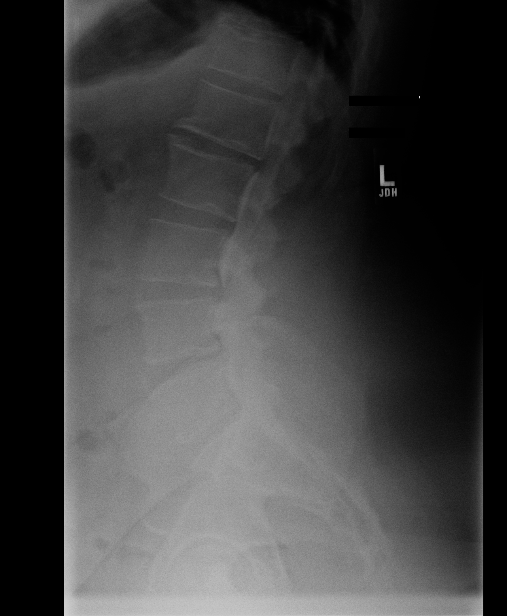

[Series 1006: view not recorded · 0.20mm/px · 1 of 1 slices shown (4 of 4)]
[im 1/1]
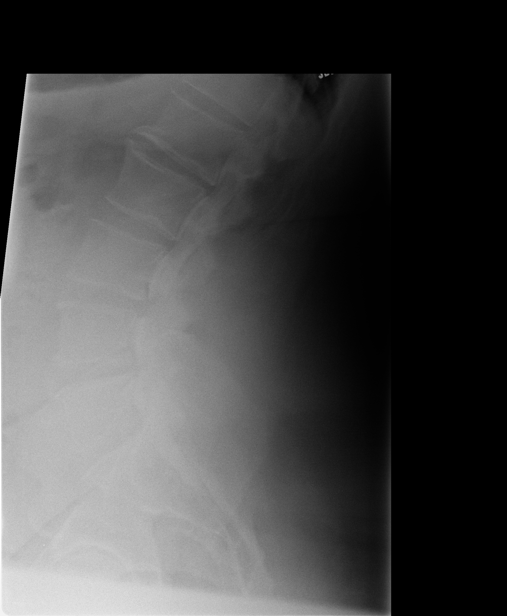

[13 of 22 positions shown; findings below may reference images not displayed]

FLUOROSCOPY TIME:  1 min 7 seconds

PROCEDURE:
After thorough discussion of risks and benefits of the procedure
including bleeding, infection, injury to nerves, blood vessels,
adjacent structures as well as headache and CSF leak, written and
oral informed consent was obtained. Consent was obtained by Dr.
Marzephine Tabaluyan. Time out form was completed.

Patient was positioned prone on the fluoroscopy table. Local
anesthesia was provided with 1% lidocaine without epinephrine after
prepped and draped in the usual sterile fashion. Puncture was
performed at L4-5 using a 5 inch 22-gauge spinal needle via right
paramedian approach. Using a single pass through the dura, the
needle was placed within the thecal sac, with return of clear CSF.
15 mL of 2mnipaque-VT8 was injected into the thecal sac, with normal
opacification of the nerve roots and cauda equina consistent with
free flow within the subarachnoid space.

I personally performed the lumbar puncture and administered the
intrathecal contrast. I also personally supervised acquisition of
the myelogram images.
FINDINGS: LUMBAR MYELOGRAM FINDINGS:

There is no listhesis. There is no evidence of dynamic instability
on upright flexion or extension views. There is moderate to severe
spinal stenosis at L3-4 which appears worse with standing.

CT LUMBAR MYELOGRAM FINDINGS:

There is no listhesis. Vertebral body heights are preserved. Mild
thickening of the nerve roots of the cauda equina in the mid and
lower lumbar spine may be secondary to stenosis and/or prior
surgery. The conus medullaris terminates at L1. There is diffuse
narrowing of the lumbar spinal canal due to congenitally short
pedicles. Visualized paraspinal soft tissues are unremarkable.

T12 L1:  Negative.

L1-2: Mild disc bulge and endplate spurring result in mild bilateral
neural foraminal narrowing. No spinal canal stenosis.

L2-3: Mild disc bulge and endplate spurring resulting in mild
bilateral neural foraminal narrowing. No spinal canal stenosis.

L3-4: Disc bulge, facet and ligamentum flavum hypertrophy, and
congenitally short pedicles result in moderate to severe spinal
stenosis and moderate bilateral neural foraminal stenosis.

L4-5: Mild disc bulge, facet and ligamentum flavum hypertrophy, and
congenitally short pedicles result in mild spinal stenosis and
moderate left and mild-to-moderate right neural foraminal stenosis.

L5-S1: Left foraminal disc protrusion results in moderate left
neural foraminal stenosis without spinal canal stenosis.
IMPRESSION: LUMBAR MYELOGRAM IMPRESSION:

No listhesis. Moderate to severe spinal stenosis at L3-4 which
appears worse with standing.

CT LUMBAR MYELOGRAM IMPRESSION:

1. Moderate to severe spinal stenosis at L3-4 due to congenitally
short pedicles, disc bulge, and posterior element hypertrophy.
Moderate bilateral neural foraminal stenosis.
2. Mild spinal stenosis and left greater than right neural foraminal
stenosis at L4-5.
3. Moderate left neural foraminal stenosis at L5-S1 due to a
foraminal disc protrusion.

## 2014-09-16 NOTE — Op Note (Signed)
Charles Jenkins, Charles Jenkins               ACCOUNT NO.:  [id number]  MEDICAL RECORD NO.:  [id number]  LOCATION:  8592                         FACILITY:  [facility]  PHYSICIAN:  Susa Day, M.D.    DATE OF BIRTH:  05/08/1960  DATE OF PROCEDURE:  05/04/2014 DATE OF DISCHARGE:  05/05/2014                              OPERATIVE REPORT   ADDENDUM  PROCEDURES PERFORMED: 1. Foraminotomies of L4 and L3 bilaterally.     Susa Day, M.D.     Geralynn Rile  D:  09/16/2014  T:  09/16/2014  Job:  [id number]

## 2016-04-09 ENCOUNTER — Other Ambulatory Visit: Payer: Self-pay | Admitting: Rheumatology

## 2016-04-09 DIAGNOSIS — M47819 Spondylosis without myelopathy or radiculopathy, site unspecified: Secondary | ICD-10-CM | POA: Insufficient documentation

## 2016-04-09 DIAGNOSIS — Z79899 Other long term (current) drug therapy: Secondary | ICD-10-CM | POA: Insufficient documentation

## 2016-04-09 NOTE — Telephone Encounter (Signed)
Last Visit: 12/12/15 Next Visit: 04/11/16 Labs: 01/23/16 WNL  Okay to refill MTX?

## 2016-04-09 NOTE — Progress Notes (Signed)
Office Visit Note  Patient: Charles Jenkins             Date of Birth: Jun 18, 1960           MRN: [id number]             PCP: Vyas,Dhruv,M.D. Referring: No ref. provider found Visit Date: 04/11/2016 Occupation: Disability    Subjective:  Pain in hands.   History of Present Illness: Charles Jenkins is a 55 y.o. male with history of a spondyloarthropathy. He states with the change in weather he is been having increased discomfort in his hands and some discomfort in his left shoulder. He denies any joint swelling. The lower back pain is tolerable. He is been tolerating his medications well.   Patient states that he had colonoscopy done for blood in his stool which showed a polyp. The follow-up will be in one year.  Activities of Daily Living:  Patient reports morning stiffness for 5 minutes.   Patient Denies nocturnal pain.  Difficulty dressing/grooming: Denies Difficulty climbing stairs: Reports Difficulty getting out of chair: Reports Difficulty using hands for taps, buttons, cutlery, and/or writing: Reports   Review of Systems  Constitutional: Negative for fatigue, night sweats and weakness ( ).  HENT: Negative for mouth sores, mouth dryness and nose dryness.   Eyes: Negative for redness and dryness.  Respiratory: Negative for shortness of breath and difficulty breathing.   Cardiovascular: Positive for hypertension. Negative for chest pain, palpitations, irregular heartbeat and swelling in legs/feet.  Gastrointestinal: Negative for constipation and diarrhea.  Endocrine: Negative for increased urination.  Musculoskeletal: Positive for arthralgias, joint pain and morning stiffness. Negative for joint swelling, myalgias, muscle weakness, muscle tenderness and myalgias.  Skin: Negative for color change, rash, hair loss, nodules/bumps, skin tightness, ulcers and sensitivity to sunlight.  Allergic/Immunologic: Negative for susceptible to infections.  Neurological: Negative for  dizziness, fainting, memory loss and night sweats.  Hematological: Negative for swollen glands.  Psychiatric/Behavioral: Negative for depressed mood and sleep disturbance. The patient is not nervous/anxious.     PMFS History:  Patient Active Problem List   Diagnosis Date Noted  . Primary osteoarthritis of both hands 04/10/2016  . Primary osteoarthritis of both feet 04/10/2016  . High risk medication use 04/09/2016  . 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 Lumbar spinal stenosis 05/05/2014  . 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 Insomnia 03/10/2013  . Spinal stenosis of lumbar region 10/28/2012  . Sciatica 09/23/2012  . Essential hypertension, benign 07/24/2012  . Sacroiliitis ([place]) 07/24/2012  . Depression 07/24/2012    Past Medical History:  Diagnosis Date  . Ankylosing spondylitis ([place])   . Arthritis   . Back pain   . Chronic right SI joint pain   . Hypertension    "only in doctor's offices"    No family history on file. Past Surgical History:  Procedure Laterality Date  . LUMBAR LAMINECTOMY/DECOMPRESSION MICRODISCECTOMY Left 10/28/2012   Procedure: MICRO LUMBAR DECOMPRESSION L4 - L5 and L5 - S1 2 LEVELS;  Surgeon: Johnn Hai, MD;  Location: [facility];  Service: Orthopedics;  Laterality: Left;  . LUMBAR LAMINECTOMY/DECOMPRESSION MICRODISCECTOMY N/A 05/04/2014   Procedure: MICRO LUMBAR DECOMPRESSION L2-L3, L3-L4;  Surgeon: Johnn Hai, MD;  Location: [facility];  Service: Orthopedics;  Laterality: N/A;   Social History   Social History Narrative  . No narrative on file     Objective: Vital Signs: BP (!) 148/80   Pulse 78  Resp 18   Ht 6' (1.829 m)   Wt (!) 345 lb (156.5 kg)   BMI 46.79 kg/m    Physical Exam  Constitutional: He is oriented to person, place, and time. He appears well-developed and well-nourished.  HENT:  Head: Normocephalic and atraumatic.  Eyes: Conjunctivae and EOM are  normal. Pupils are equal, round, and reactive to light.  Neck: Normal range of motion. Neck supple.  Cardiovascular: Normal rate, regular rhythm and normal heart sounds.   Pulmonary/Chest: Effort normal and breath sounds normal.  Abdominal: Soft. Bowel sounds are normal.  Neurological: He is alert and oriented to person, place, and time.  Skin: Skin is warm and dry. Capillary refill takes less than 2 seconds.  Psychiatric: He has a normal mood and affect. His behavior is normal.  Nursing note and vitals reviewed.    Musculoskeletal Exam: C-spine and thoracic spine good range of motion he has some limitation of range of motion of his lumbar spine no tenderness on palpation over SI joints today. Shoulder joints elbow joints wrist joints are good range of motion he has no tenderness on palpation of her MCP joints he has thickening of PIP/DIP joints with some discomfort with fist formation. He has good range of motion of his hip joints knee joints ankles MTPs PIPs with no synovitis on examination today.  CDAI Exam: CDAI Homunculus Exam:   Joint Counts:  CDAI Tender Joint count: 0 CDAI Swollen Joint count: 0  Global Assessments:  Patient Global Assessment: 6 Provider Global Assessment: 2  CDAI Calculated Score: 8    Investigation: Findings:  07/2015 TB gold negative  01/22/2016 CBC and CMP normal Ankylosing spondylitis with sacroiliitis, positive on MRI negative HLA B27 September 2013:  CBC, comprehensive metabolic panel, sed rate, UA, hepatitis panel, TB Gold, CK, rheumatoid factor, ANA, CCP, HLA-B27, chest x-ray, and SPEP were all within normal limits. June 2015 HIV non reactive     Imaging: No results found.  Speciality Comments: No specialty comments available.    Procedures:  No procedures performed Allergies: Patient has no known allergies.   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 He is doing really well on current combination of medications. He continues to have some stiffness but no synovitis on examination: He had no tenderness over SI joint on palpation today.  Sacroiliitis ([place])  High risk medication use - Humira every week and Methotrexate 3 tablets per week, folic acid 1 mg daily, TB: April 2017 - Plan: CBC with Differential/Platelet, COMPLETE METABOLIC PANEL WITH GFR, today and then every 3 months to monitor for drug toxicity CBC with Differential/Platelet, COMPLETE METABOLIC PANEL WITH GFR. He's been having difficulty getting Humira samples due to his insurance changes. We gave him 2 samples of Humira today.  Primary osteoarthritis of both hands: He continues to have some pain and stiffness joint protection and muscle strengthening was discussed.  Primary osteoarthritis of both feet: Proper fitting shoes were discussed.  DDD lumbar spine status post fusion - July 2014 and January 2016 by Dr. Maxie Better  His other med medical problems are as follows for which hs is been by the physicians:  Essential hypertension, benign  Obesity: Weight loss diet and exercise was discussed.  Polyp of colon - Polypectomy October 2017  Family history of scleroderma - In mother    Orders: Orders Placed This Encounter  Procedures  . CBC with Differential/Platelet  . COMPLETE METABOLIC PANEL WITH  GFR  . CBC with Differential/Platelet  . COMPLETE METABOLIC PANEL WITH GFR   No orders of the defined types were placed in this encounter.   Face-to-face time spent with patient was 30 minutes. 50% of time was spent in counseling and coordination of care.  Follow-Up Instructions: No Follow-up on file.   Bo Merino, MD

## 2016-04-10 DIAGNOSIS — M19071 Primary osteoarthritis, right ankle and foot: Secondary | ICD-10-CM | POA: Insufficient documentation

## 2016-04-10 DIAGNOSIS — M19041 Primary osteoarthritis, right hand: Secondary | ICD-10-CM | POA: Insufficient documentation

## 2016-04-10 DIAGNOSIS — M19072 Primary osteoarthritis, left ankle and foot: Secondary | ICD-10-CM

## 2016-04-10 DIAGNOSIS — M19042 Primary osteoarthritis, left hand: Secondary | ICD-10-CM | POA: Insufficient documentation

## 2016-04-11 ENCOUNTER — Encounter: Payer: Self-pay | Admitting: Rheumatology

## 2016-04-11 ENCOUNTER — Ambulatory Visit (INDEPENDENT_AMBULATORY_CARE_PROVIDER_SITE_OTHER): Payer: Medicare Other | Admitting: Rheumatology

## 2016-04-11 VITALS — BP 148/80 | HR 78 | Resp 18 | Ht 72.0 in | Wt 345.0 lb

## 2016-04-11 DIAGNOSIS — M48061 Spinal stenosis, lumbar region without neurogenic claudication: Secondary | ICD-10-CM

## 2016-04-11 DIAGNOSIS — Z6841 Body Mass Index (BMI) 40.0 and over, adult: Secondary | ICD-10-CM

## 2016-04-11 DIAGNOSIS — M461 Sacroiliitis, not elsewhere classified: Secondary | ICD-10-CM | POA: Diagnosis not present

## 2016-04-11 DIAGNOSIS — M19071 Primary osteoarthritis, right ankle and foot: Secondary | ICD-10-CM | POA: Diagnosis not present

## 2016-04-11 DIAGNOSIS — M19042 Primary osteoarthritis, left hand: Secondary | ICD-10-CM | POA: Diagnosis not present

## 2016-04-11 DIAGNOSIS — K635 Polyp of colon: Secondary | ICD-10-CM | POA: Diagnosis not present

## 2016-04-11 DIAGNOSIS — Z8269 Family history of other diseases of the musculoskeletal system and connective tissue: Secondary | ICD-10-CM | POA: Diagnosis not present

## 2016-04-11 DIAGNOSIS — M19072 Primary osteoarthritis, left ankle and foot: Secondary | ICD-10-CM

## 2016-04-11 DIAGNOSIS — M47819 Spondylosis without myelopathy or radiculopathy, site unspecified: Secondary | ICD-10-CM

## 2016-04-11 DIAGNOSIS — I10 Essential (primary) hypertension: Secondary | ICD-10-CM | POA: Diagnosis not present

## 2016-04-11 DIAGNOSIS — M19041 Primary osteoarthritis, right hand: Secondary | ICD-10-CM | POA: Diagnosis not present

## 2016-04-11 DIAGNOSIS — M4690 Unspecified inflammatory spondylopathy, site unspecified: Secondary | ICD-10-CM | POA: Diagnosis not present

## 2016-04-11 DIAGNOSIS — E6609 Other obesity due to excess calories: Secondary | ICD-10-CM | POA: Diagnosis not present

## 2016-04-11 DIAGNOSIS — Z79899 Other long term (current) drug therapy: Secondary | ICD-10-CM | POA: Diagnosis not present

## 2016-04-11 DIAGNOSIS — IMO0001 Reserved for inherently not codable concepts without codable children: Secondary | ICD-10-CM

## 2016-04-11 LAB — CBC WITH DIFFERENTIAL/PLATELET
Basophils Absolute: 0 cells/uL (ref 0–200)
Basophils Relative: 0 %
EOS PCT: 5 %
Eosinophils Absolute: 405 cells/uL (ref 15–500)
HCT: 44.3 % (ref 38.5–50.0)
Hemoglobin: 14.6 g/dL (ref 13.2–17.1)
Lymphocytes Relative: 22 %
Lymphs Abs: 1782 cells/uL (ref 850–3900)
MCH: 29.3 pg (ref 27.0–33.0)
MCHC: 33 g/dL (ref 32.0–36.0)
MCV: 89 fL (ref 80.0–100.0)
MPV: 11 fL (ref 7.5–12.5)
Monocytes Absolute: 1134 cells/uL — ABNORMAL HIGH (ref 200–950)
Monocytes Relative: 14 %
NEUTROS ABS: 4779 {cells}/uL (ref 1500–7800)
Neutrophils Relative %: 59 %
PLATELETS: 320 10*3/uL (ref 140–400)
RBC: 4.98 MIL/uL (ref 4.20–5.80)
RDW: 13.8 % (ref 11.0–15.0)
WBC: 8.1 10*3/uL (ref 3.8–10.8)

## 2016-04-11 LAB — COMPLETE METABOLIC PANEL WITH GFR
ALT: 16 U/L (ref 9–46)
AST: 17 U/L (ref 10–35)
Albumin: 3.9 g/dL (ref 3.6–5.1)
Alkaline Phosphatase: 81 U/L (ref 40–115)
BILIRUBIN TOTAL: 0.3 mg/dL (ref 0.2–1.2)
BUN: 13 mg/dL (ref 7–25)
CO2: 26 mmol/L (ref 20–31)
CREATININE: 0.84 mg/dL (ref 0.70–1.33)
Calcium: 9 mg/dL (ref 8.6–10.3)
Chloride: 104 mmol/L (ref 98–110)
GFR, Est African American: >89 mL/min (ref >=60)
GFR, Est Non African American: >89 mL/min (ref >=60)
Glucose, Bld: 114 mg/dL — ABNORMAL HIGH (ref 65–99)
Potassium: 4.4 mmol/L (ref 3.5–5.3)
SODIUM: 138 mmol/L (ref 135–146)
TOTAL PROTEIN: 7.1 g/dL (ref 6.1–8.1)

## 2016-04-11 NOTE — Progress Notes (Signed)
Rheumatology Medication Review by a Pharmacist Does the patient feel that his/her medications are working for him/her?  Yes Has the patient been experiencing any side effects to the medications prescribed?  No Does the patient have any problems obtaining medications?  Yes - patient had questions regarding patient assistance program for next year.   Issues to address at subsequent visits: [employer] Patient Assistance Program   Pharmacist comments:  Charles Jenkins is a pleasant 55 yo M who presents for follow up of his spondyloarthropathy.  Patient is currently taking Humira 40 mg every other week.  Patient was enrolled in Humira patient assistance program earlier this year and had questions regarding enrollment for next year.  I advised patient that he will need to re-enroll for 2018.  I assisted patient in filling out the [employer] patient assistance form for Humira.  Patient will gather his income information and submit his portion of the form.  Provider portion was signed by Dr. Estanislado Pandy and faxed over during visit.  Patient denied any other questions or concerns regarding his medications.    Elisabeth Most, Pharm.D., BCPS Clinical Pharmacist Pager: [phone] Phone: [phone] 04/11/2016 12:08 PM

## 2016-04-12 NOTE — Progress Notes (Signed)
Labs normal.

## 2016-04-18 ENCOUNTER — Telehealth: Payer: Self-pay | Admitting: Pharmacist

## 2016-04-18 NOTE — Telephone Encounter (Signed)
Provided patient with the patient portion of [employer] patient assistance program for Humira at his visit on 04/11/16.  I called patient to follow up.  He confirms he submitted his portion on 04/15/16.

## 2016-04-29 HISTORY — PX: KNEE ARTHROPLASTY: SHX992

## 2016-04-30 ENCOUNTER — Telehealth: Payer: Self-pay

## 2016-04-30 ENCOUNTER — Telehealth: Payer: Self-pay | Admitting: Rheumatology

## 2016-04-30 NOTE — Telephone Encounter (Signed)
Patient's wife states the paperwork has been sent in for Humira and would like to know if he is approved for 2018.

## 2016-04-30 NOTE — Telephone Encounter (Signed)
Spoke with representative from [employer] about the status for Mr. Charles Jenkins patient assistance application. It is still being processed and they are missing page two of the patient information section. It needs signatures. The patient also needs to clarify if the income has changed for 2018.   Left message for patient to call back.   Avenell Sellers, [place], CPhT

## 2016-05-01 NOTE — Telephone Encounter (Signed)
Called [employer] patient assistance for Humira to check the status for Charles Jenkins and clarify what he needs to be approved. Representative states that patient is missing page 2 of the end of year application that was sent to him from the company. She is going to fax me a copy for a patient. They patient also needs to clarify if their income for 2018 has changed. Patient needs to contact [employer] to update them. If nothing has changed or if there was a decrease, [employer] will not need anything. If there was an increase, the patient will need to provide proof and a bank statement with the bank name visible.   Spoke with Charles Jenkins, (Mr. Zdanowicz wife) about the Humira patinet assistance application. She is going to call me back when she gets to work around 10:30 to give me the fax number so that I can fax the forms to her. She will fax the company back with the information they need.  Will fax the documents they need to Charles Jenkins and inform her about updating them on the financial status for 2018.  Charles Jenkins, Charles Jenkins, CPhT

## 2016-05-10 ENCOUNTER — Telehealth: Payer: Self-pay

## 2016-05-10 NOTE — Telephone Encounter (Signed)
Spoke with a representative from [employer] patient assistance regarding patients Humira. All of the documents have been received and the application is being processed.  Will check back on 05/17/16  Demetrios Loll, CPhT

## 2016-05-15 ENCOUNTER — Telehealth: Payer: Self-pay | Admitting: Pharmacist

## 2016-05-15 NOTE — Telephone Encounter (Signed)
Received fax from [employer] patient assistance program stating patient has been approved for Humira through 04/28/17.  I called patient to advise.  Patient denies any further questions or concerns at this time.    Elisabeth Most, Pharm.D., BCPS, CPP Clinical Pharmacist Pager: [phone] Phone: [phone] 05/15/2016 9:14 AM

## 2016-07-01 ENCOUNTER — Other Ambulatory Visit: Payer: Self-pay | Admitting: Rheumatology

## 2016-07-02 NOTE — Telephone Encounter (Signed)
Last Visit: 04/11/16 Next Visit: 09/09/16 Labs: 04/11/16 WNL  Okay to refill MTX?

## 2016-07-02 NOTE — Telephone Encounter (Signed)
ok 

## 2016-08-12 ENCOUNTER — Telehealth: Payer: Self-pay | Admitting: Rheumatology

## 2016-08-12 NOTE — Telephone Encounter (Signed)
Patient advised his prescription is awaiting a signature from Dr. Estanislado Pandy and will fax tomorrow.

## 2016-08-12 NOTE — Telephone Encounter (Signed)
Patient is due for his Humira.  He needs Dr. Estanislado Pandy to send in the approval for his refills.  CB#[phone].  Thank you.

## 2016-09-05 ENCOUNTER — Other Ambulatory Visit: Payer: Self-pay | Admitting: Rheumatology

## 2016-09-05 NOTE — Telephone Encounter (Signed)
Last Visit: 04/11/16 Next Visit: 09/09/16  Okay to refill Folic Acid?

## 2016-09-05 NOTE — Telephone Encounter (Signed)
ok 

## 2016-09-09 ENCOUNTER — Ambulatory Visit: Payer: Medicare Other | Admitting: Rheumatology

## 2016-09-11 ENCOUNTER — Other Ambulatory Visit: Payer: Self-pay | Admitting: Rheumatology

## 2016-09-11 NOTE — Telephone Encounter (Signed)
Last Visit: 04/11/16 Next Visit: 09/26/16 Labs: 04/11/16 WNL Patient will update this week.

## 2016-09-11 NOTE — Telephone Encounter (Signed)
Okay to refill 30 day supply per Dr. Deveshwar 

## 2016-09-13 ENCOUNTER — Other Ambulatory Visit: Payer: Self-pay | Admitting: *Deleted

## 2016-09-13 DIAGNOSIS — Z79899 Other long term (current) drug therapy: Secondary | ICD-10-CM

## 2016-09-13 NOTE — Progress Notes (Signed)
Office Visit Note  Patient: Charles Jenkins             Date of Birth: 06-01-1960           MRN: [id number]             PCP: Glenda Chroman, MD Referring: No ref. provider found Visit Date: 09/26/2016 Occupation: _0 @    Subjective:  Arthritis (Follow up, doing good, some joint pain at times)   History of Present Illness: Charles Jenkins is a 56 y.o. male  Who was seen about 5 months ago. Patient states that he is doing well. He has no complaint.  He takes his Humira every 2 weeks and methotrexate, 3 pills per week and folic acid 1 mg daily. He has no back pain at the moment but at times when he does have pain, he can be as high as 8 on a scale of 0-10. His hands are doing well. Initially he had synovitis to his MCP joints but he no longer has that once he started methotrexate 3 per week.  Patient would like to lose weight. He has made plans to start walking. He is agreeable to lose about 10 pounds with proper nutrition diet and exercise by the time he comes back in 5 months.    Activities of Daily Living:  Patient reports morning stiffness for 15 minutes.   Patient Denies nocturnal pain.  Difficulty dressing/grooming: Denies Difficulty climbing stairs: Denies Difficulty getting out of chair: Denies Difficulty using hands for taps, buttons, cutlery, and/or writing: Denies   No Rheumatology ROS completed.   PMFS History:  Patient Active Problem List   Diagnosis Date Noted  . Family history of scleroderma 09/25/2016  . Obesity due to excess calories 09/25/2016  . Primary osteoarthritis of both hands 04/10/2016  . Primary osteoarthritis of both feet 04/10/2016  . High risk medication use 04/09/2016  . 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 Lumbar spinal stenosis 05/05/2014  . 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 Insomnia 03/10/2013  . Spinal stenosis of lumbar region 10/28/2012  . Sciatica  09/23/2012  . Essential hypertension, benign 07/24/2012  . Sacroiliitis ([place]) 07/24/2012  . Depression 07/24/2012    Past Medical History:  Diagnosis Date  . Ankylosing spondylitis ([place])   . Arthritis   . Back pain   . Chronic right SI joint pain   . Hypertension    "only in doctor's offices"    History reviewed. No pertinent family history. Past Surgical History:  Procedure Laterality Date  . LUMBAR LAMINECTOMY/DECOMPRESSION MICRODISCECTOMY Left 10/28/2012   Procedure: MICRO LUMBAR DECOMPRESSION L4 - L5 and L5 - S1 2 LEVELS;  Surgeon: Johnn Hai, MD;  Location: [facility];  Service: Orthopedics;  Laterality: Left;  . LUMBAR LAMINECTOMY/DECOMPRESSION MICRODISCECTOMY N/A 05/04/2014   Procedure: MICRO LUMBAR DECOMPRESSION L2-L3, L3-L4;  Surgeon: Johnn Hai, MD;  Location: [facility];  Service: Orthopedics;  Laterality: N/A;   Social History   Social History Narrative  . No narrative on file     Objective: Vital Signs: BP (!) 158/75 (BP Location: Left Arm, Patient Position: Sitting, Cuff Size: Normal)   Pulse 73   Resp 16   Ht 6' (1.829 m)   Wt (!) 346 lb (156.9 kg)   BMI 46.93 kg/m    Physical Exam   Musculoskeletal Exam:  Full range of motion of all joints Grip strength is equal and strong bilaterally Fiber myalgia tender  points are all absent  CDAI Exam: CDAI Homunculus Exam:   Joint Counts:  CDAI Tender Joint count: 0 CDAI Swollen Joint count: 0  Global Assessments:  Patient Global Assessment: 8 Provider Global Assessment: 8  CDAI Calculated Score: 16 No synovitis on examination  Investigation: Findings:  TB Gold is negative as of April 2017.   (We will get his TB gold test done today in office.)  CBC Latest Ref Rng & Units 09/13/2016 04/11/2016 04/27/2014  WBC 3.4 - 10.8 x10E3/uL 7.8 8.1 7.5  Hemoglobin 13.2 - 17.1 g/dL - 14.6 14.0  Hematocrit 37.5 - 51.0 % 45.2 44.3 45.1  Platelets 150 - 379 x10E3/uL 288 320 299  CMP normal  Orders Only on  09/13/2016  Component Date Value Ref Range Status  . WBC 09/13/2016 7.8  3.4 - 10.8 x10E3/uL Final  . RBC 09/13/2016 5.04  4.14 - 5.80 x10E6/uL Final  . Hemoglobin 09/13/2016 14.6  13.0 - 17.7 g/dL Final  . Hematocrit 09/13/2016 45.2  37.5 - 51.0 % Final  . MCV 09/13/2016 90  79 - 97 fL Final  . MCH 09/13/2016 29.0  26.6 - 33.0 pg Final  . MCHC 09/13/2016 32.3  31.5 - 35.7 g/dL Final  . RDW 09/13/2016 13.5  12.3 - 15.4 % Final  . Platelets 09/13/2016 288  150 - 379 x10E3/uL Final  . Neutrophils 09/13/2016 56  Not Estab. % Final  . Lymphs 09/13/2016 27  Not Estab. % Final  . Monocytes 09/13/2016 12  Not Estab. % Final  . Eos 09/13/2016 4  Not Estab. % Final  . Basos 09/13/2016 1  Not Estab. % Final  . Neutrophils Absolute 09/13/2016 4.4  1.4 - 7.0 x10E3/uL Final  . Lymphocytes Absolute 09/13/2016 2.1  0.7 - 3.1 x10E3/uL Final  . Monocytes Absolute 09/13/2016 0.9  0.1 - 0.9 x10E3/uL Final  . EOS (ABSOLUTE) 09/13/2016 0.3  0.0 - 0.4 x10E3/uL Final  . Basophils Absolute 09/13/2016 0.1  0.0 - 0.2 x10E3/uL Final  . Immature Granulocytes 09/13/2016 0  Not Estab. % Final  . Immature Grans (Abs) 09/13/2016 0.0  0.0 - 0.1 x10E3/uL Final  . Glucose 09/13/2016 90  65 - 99 mg/dL Final  . BUN 09/13/2016 14  6 - 24 mg/dL Final  . Creatinine, Ser 09/13/2016 0.87  0.76 - 1.27 mg/dL Final  . GFR calc non Af Amer 09/13/2016 99  >59 mL/min/1.73 Final  . GFR calc Af Amer 09/13/2016 114  >59 mL/min/1.73 Final  . BUN/Creatinine Ratio 09/13/2016 16  9 - 20 Final  . Sodium 09/13/2016 140  134 - 144 mmol/L Final  . Potassium 09/13/2016 4.4  3.5 - 5.2 mmol/L Final  . Chloride 09/13/2016 100  96 - 106 mmol/L Final  . CO2 09/13/2016 23  18 - 29 mmol/L Final  . Calcium 09/13/2016 9.4  8.7 - 10.2 mg/dL Final  . Total Protein 09/13/2016 7.4  6.0 - 8.5 g/dL Final  . Albumin 09/13/2016 4.0  3.5 - 5.5 g/dL Final  . Globulin, Total 09/13/2016 3.4  1.5 - 4.5 g/dL Final  . Albumin/Globulin Ratio 09/13/2016 1.2  1.2 -  2.2 Final  . Bilirubin Total 09/13/2016 0.3  0.0 - 1.2 mg/dL Final  . Alkaline Phosphatase 09/13/2016 105  39 - 117 IU/L Final  . AST 09/13/2016 21  0 - 40 IU/L Final  . ALT 09/13/2016 23  0 - 44 IU/L Final  Office Visit on 04/11/2016  Component Date Value Ref Range Status  . WBC 04/11/2016  8.1  3.8 - 10.8 K/uL Final  . RBC 04/11/2016 4.98  4.20 - 5.80 MIL/uL Final  . Hemoglobin 04/11/2016 14.6  13.2 - 17.1 g/dL Final  . HCT 04/11/2016 44.3  38.5 - 50.0 % Final  . MCV 04/11/2016 89.0  80.0 - 100.0 fL Final  . MCH 04/11/2016 29.3  27.0 - 33.0 pg Final  . MCHC 04/11/2016 33.0  32.0 - 36.0 g/dL Final  . RDW 04/11/2016 13.8  11.0 - 15.0 % Final  . Platelets 04/11/2016 320  140 - 400 K/uL Final  . MPV 04/11/2016 11.0  7.5 - 12.5 fL Final  . Neutro Abs 04/11/2016 4779  1,500 - 7,800 cells/uL Final  . Lymphs Abs 04/11/2016 1782  850 - 3,900 cells/uL Final  . Monocytes Absolute 04/11/2016 1134* 200 - 950 cells/uL Final  . Eosinophils Absolute 04/11/2016 405  15 - 500 cells/uL Final  . Basophils Absolute 04/11/2016 0  0 - 200 cells/uL Final  . Neutrophils Relative % 04/11/2016 59  % Final  . Lymphocytes Relative 04/11/2016 22  % Final  . Monocytes Relative 04/11/2016 14  % Final  . Eosinophils Relative 04/11/2016 5  % Final  . Basophils Relative 04/11/2016 0  % Final  . Smear Review 04/11/2016 Criteria for review not met   Final  . Sodium 04/11/2016 138  135 - 146 mmol/L Final  . Potassium 04/11/2016 4.4  3.5 - 5.3 mmol/L Final  . Chloride 04/11/2016 104  98 - 110 mmol/L Final  . CO2 04/11/2016 26  20 - 31 mmol/L Final  . Glucose, Bld 04/11/2016 114* 65 - 99 mg/dL Final  . BUN 04/11/2016 13  7 - 25 mg/dL Final  . Creat 04/11/2016 0.84  0.70 - 1.33 mg/dL Final   Comment:   For patients > or = 54 years of age: The upper reference limit for Creatinine is approximately 13% higher for people identified as African-American.     . Total Bilirubin 04/11/2016 0.3  0.2 - 1.2 mg/dL Final  .  Alkaline Phosphatase 04/11/2016 81  40 - 115 U/L Final  . AST 04/11/2016 17  10 - 35 U/L Final  . ALT 04/11/2016 16  9 - 46 U/L Final  . Total Protein 04/11/2016 7.1  6.1 - 8.1 g/dL Final  . Albumin 04/11/2016 3.9  3.6 - 5.1 g/dL Final  . Calcium 04/11/2016 9.0  8.6 - 10.3 mg/dL Final  . GFR, Est African American 04/11/2016 >89  >=60 mL/min Final  . GFR, Est Non African American 04/11/2016 >89  >=60 mL/min Final    Imaging: No results found.  Speciality Comments: No specialty comments available.    Procedures:  No procedures performed Allergies: Patient has no known allergies.   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[place]) - History of sacroiliitis, Achillis tendinitis, HLA-B27 negative  Sacroiliitis ([facility])  High risk medication use - Humira every week and Methotrexate 3 tablets per week, folic acid 1 mg daily, TB: April 2017 - Plan: Quantiferon tb gold assay (blood)  Primary osteoarthritis of both hands  Primary osteoarthritis of both feet  Spinal stenosis of lumbar region without neurogenic claudication - Status post fusion  Primary insomnia  Hypergammaglobulinemia  History of hypertension  History of diabetes mellitus  Family history of scleroderma  Class 3 severe obesity   Plan: #1: Spondyloarthropathy. Doing well. Occasional back pain but currently doing well. Rates his pain and discomfort as 8 on a scale of 0-10.  #2: High risk  prescription. Humira every 2 weeks Doing well. Using patient assistance. Has re-applied for 2018 and he states that the paperwork is in the process.  #3: Patient's labs are normal and up-to-date except he is needing a TB test today. We will get that for him and then I have asked him to get annual term check. CBC with differential CMP with GFR done 09/13/2016 is normal.  #4: On occasion, patient has pain to bilateral hands secondary to OA. On occasion he was also having some synovitis. The methotrexate but  is taking at this time, 3 pills per week, is adequately controlling synovitis in his hands. He takes folic acid 1 mg every day.  #5: Obesity. We discussed the importance of weight loss and patient has agreed to attempt to lose about 10 pounds of weight by next visit in 5 months  #6: Return to clinic in 5 months  Orders: Orders Placed This Encounter  Procedures  . Quantiferon tb gold assay (blood)   No orders of the defined types were placed in this encounter.   Face-to-face time spent with patient was 30 minutes. 50% of time was spent in counseling and coordination of care.  Follow-Up Instructions: Return in about 5 months (around 02/26/2017) for SpA,humira q 2wk,mtx 3/wk,folic,adeq response,obesity.   Eliezer Lofts, PA-C   I examined and evaluated the patient with Eliezer Lofts PA. The plan of care was discussed as noted above.  Bo Merino, MD Note - This record has been created using Editor, commissioning.  Chart creation errors have been sought, but may not always  have been located. Such creation errors do not reflect on  the standard of medical care.

## 2016-09-14 LAB — CBC WITH DIFFERENTIAL/PLATELET
BASOS ABS: 0.1 10*3/uL (ref 0.0–0.2)
Basos: 1 %
EOS (ABSOLUTE): 0.3 10*3/uL (ref 0.0–0.4)
Eos: 4 %
Hematocrit: 45.2 % (ref 37.5–51.0)
Hemoglobin: 14.6 g/dL (ref 13.0–17.7)
Immature Grans (Abs): 0 10*3/uL (ref 0.0–0.1)
Immature Granulocytes: 0 %
LYMPHS: 27 %
Lymphocytes Absolute: 2.1 10*3/uL (ref 0.7–3.1)
MCH: 29 pg (ref 26.6–33.0)
MCHC: 32.3 g/dL (ref 31.5–35.7)
MCV: 90 fL (ref 79–97)
Monocytes Absolute: 0.9 10*3/uL (ref 0.1–0.9)
Monocytes: 12 %
Neutrophils Absolute: 4.4 10*3/uL (ref 1.4–7.0)
Neutrophils: 56 %
PLATELETS: 288 10*3/uL (ref 150–379)
RBC: 5.04 x10E6/uL (ref 4.14–5.80)
RDW: 13.5 % (ref 12.3–15.4)
WBC: 7.8 10*3/uL (ref 3.4–10.8)

## 2016-09-14 LAB — CMP14+EGFR
ALK PHOS: 105 IU/L (ref 39–117)
ALT: 23 IU/L (ref 0–44)
AST: 21 IU/L (ref 0–40)
Albumin/Globulin Ratio: 1.2 (ref 1.2–2.2)
Albumin: 4 g/dL (ref 3.5–5.5)
BILIRUBIN TOTAL: 0.3 mg/dL (ref 0.0–1.2)
BUN / CREAT RATIO: 16 (ref 9–20)
BUN: 14 mg/dL (ref 6–24)
CALCIUM: 9.4 mg/dL (ref 8.7–10.2)
CO2: 23 mmol/L (ref 18–29)
CREATININE: 0.87 mg/dL (ref 0.76–1.27)
Chloride: 100 mmol/L (ref 96–106)
GFR calc non Af Amer: 99 mL/min/{1.73_m2} (ref >59)
GFR, EST AFRICAN AMERICAN: 114 mL/min/{1.73_m2} (ref >59)
GLOBULIN, TOTAL: 3.4 g/dL (ref 1.5–4.5)
Glucose: 90 mg/dL (ref 65–99)
Potassium: 4.4 mmol/L (ref 3.5–5.2)
Sodium: 140 mmol/L (ref 134–144)
Total Protein: 7.4 g/dL (ref 6.0–8.5)

## 2016-09-16 NOTE — Progress Notes (Signed)
wnl

## 2016-09-18 ENCOUNTER — Telehealth: Payer: Self-pay | Admitting: Rheumatology

## 2016-09-18 NOTE — Telephone Encounter (Signed)
Patient had labs last Friday. Patient states rx for MTX has not been received by his pharmacy. Patient uses [facility]. Please call to advise.

## 2016-09-19 MED ORDER — METHOTREXATE SODIUM 2.5 MG PO TABS: ORAL_TABLET | ORAL | 0 refills | Status: DC

## 2016-09-19 NOTE — Telephone Encounter (Signed)
Last Visit: 04/11/16 Next Visit: 09/26/16 Labs: 09/13/16 WNL  Okay to refill Per Dr. Estanislado Pandy  Left message left to advise patient prescription sent to the pharmacy.

## 2016-09-25 DIAGNOSIS — Z8269 Family history of other diseases of the musculoskeletal system and connective tissue: Secondary | ICD-10-CM | POA: Insufficient documentation

## 2016-09-25 DIAGNOSIS — E6609 Other obesity due to excess calories: Secondary | ICD-10-CM | POA: Insufficient documentation

## 2016-09-26 ENCOUNTER — Ambulatory Visit (INDEPENDENT_AMBULATORY_CARE_PROVIDER_SITE_OTHER): Payer: Medicare Other | Admitting: Rheumatology

## 2016-09-26 ENCOUNTER — Encounter: Payer: Self-pay | Admitting: Rheumatology

## 2016-09-26 VITALS — BP 158/75 | HR 73 | Resp 16 | Ht 72.0 in | Wt 346.0 lb

## 2016-09-26 DIAGNOSIS — F5101 Primary insomnia: Secondary | ICD-10-CM | POA: Diagnosis not present

## 2016-09-26 DIAGNOSIS — Z8269 Family history of other diseases of the musculoskeletal system and connective tissue: Secondary | ICD-10-CM

## 2016-09-26 DIAGNOSIS — Z8679 Personal history of other diseases of the circulatory system: Secondary | ICD-10-CM

## 2016-09-26 DIAGNOSIS — M461 Sacroiliitis, not elsewhere classified: Secondary | ICD-10-CM

## 2016-09-26 DIAGNOSIS — M19071 Primary osteoarthritis, right ankle and foot: Secondary | ICD-10-CM | POA: Diagnosis not present

## 2016-09-26 DIAGNOSIS — D892 Hypergammaglobulinemia, unspecified: Secondary | ICD-10-CM

## 2016-09-26 DIAGNOSIS — M4690 Unspecified inflammatory spondylopathy, site unspecified: Secondary | ICD-10-CM | POA: Diagnosis not present

## 2016-09-26 DIAGNOSIS — M47819 Spondylosis without myelopathy or radiculopathy, site unspecified: Secondary | ICD-10-CM

## 2016-09-26 DIAGNOSIS — M48061 Spinal stenosis, lumbar region without neurogenic claudication: Secondary | ICD-10-CM

## 2016-09-26 DIAGNOSIS — Z8639 Personal history of other endocrine, nutritional and metabolic disease: Secondary | ICD-10-CM | POA: Diagnosis not present

## 2016-09-26 DIAGNOSIS — M19041 Primary osteoarthritis, right hand: Secondary | ICD-10-CM | POA: Diagnosis not present

## 2016-09-26 DIAGNOSIS — Z79899 Other long term (current) drug therapy: Secondary | ICD-10-CM | POA: Diagnosis not present

## 2016-09-26 DIAGNOSIS — M19042 Primary osteoarthritis, left hand: Secondary | ICD-10-CM

## 2016-09-26 DIAGNOSIS — M19072 Primary osteoarthritis, left ankle and foot: Secondary | ICD-10-CM

## 2016-09-26 DIAGNOSIS — Z6841 Body Mass Index (BMI) 40.0 and over, adult: Secondary | ICD-10-CM

## 2016-09-26 DIAGNOSIS — E66813 Obesity, class 3: Secondary | ICD-10-CM

## 2016-09-26 NOTE — Patient Instructions (Signed)

## 2016-09-30 LAB — QUANTIFERON TB GOLD ASSAY (BLOOD)
Interferon Gamma Release Assay: NEGATIVE
Mitogen-Nil: 8.79 IU/mL
Quantiferon Nil Value: 0.05 IU/mL
Quantiferon Tb Ag Minus Nil Value: <0 IU/mL

## 2016-09-30 NOTE — Progress Notes (Signed)
Tell pt, tb gold is negative

## 2016-10-23 ENCOUNTER — Telehealth: Payer: Self-pay | Admitting: Rheumatology

## 2016-10-23 NOTE — Telephone Encounter (Signed)
Patient called requesting a refill on his Humira.  CB#[phone].  Thank you.

## 2016-10-24 MED ORDER — ADALIMUMAB 40 MG/0.8ML ~~LOC~~ PSKT: 40.0000 mg | PREFILLED_SYRINGE | SUBCUTANEOUS | 0 refills | Status: DC

## 2016-10-24 NOTE — Telephone Encounter (Signed)
Left message for patient to call the office

## 2016-10-24 NOTE — Telephone Encounter (Signed)
Last Visit: 09/26/16 Next Visit: 02/26/17 Labs: 09/13/16 WNL TB Gold: 09/26/16 Neg  Okay to refill per Dr. Estanislado Pandy and faxed to [facility] program  Patient advised.

## 2016-10-29 ENCOUNTER — Telehealth: Payer: Self-pay | Admitting: Rheumatology

## 2016-10-29 NOTE — Telephone Encounter (Signed)
Patient states he contacted the [place] program and the say they have not receive the faxed prescription. Advised patient would re fax prescription

## 2016-10-29 NOTE — Telephone Encounter (Signed)
Left message to advise patient prescription was faxed to [facility] program on 10/23/16. Patient advised to contact them and to call the office iff he has any further questions or concerns.

## 2016-10-29 NOTE — Telephone Encounter (Signed)
Patient returning your call.

## 2016-10-29 NOTE — Telephone Encounter (Signed)
Patient's wife called this morning about her husband's Humira.  The prescription needs to go the [facility].  Their [phone].  Thank you.

## 2016-10-29 NOTE — Telephone Encounter (Signed)
Patient returned your phone call.

## 2016-11-04 ENCOUNTER — Telehealth: Payer: Self-pay | Admitting: Rheumatology

## 2016-11-04 NOTE — Telephone Encounter (Signed)
Patient called this morning stating that the [place] assistance program does not have his prescription for his Humira.

## 2016-11-04 NOTE — Telephone Encounter (Signed)
Advised patient that prescription was called to the [facility] assistance program. Patient states he will call in the morning to make sure they received it.

## 2016-11-04 NOTE — Telephone Encounter (Signed)
Patient called again inquiring about his prescription for his Humira.  He would like for you to call him.

## 2016-11-05 ENCOUNTER — Telehealth: Payer: Self-pay

## 2016-11-05 NOTE — Telephone Encounter (Signed)
Received a fax from [facility] Patient Assistance stating that they received the Rx for Humira for patient. They will contact the patient directly to schedule delivery.   Phone: [phone]  Will send document to scan center.  Spoke to patient who voices understanding and denies any questions at this time.  Faithlynn Deeley, [place], CPhT 8:19 AM

## 2016-11-14 ENCOUNTER — Telehealth: Payer: Self-pay

## 2016-11-14 NOTE — Telephone Encounter (Signed)
Received a fax from [facility] Patient Assistance regarding an eligibiligy renewal application for Patient. Mr. Seeman has been approved through 04/28/2017. This application is to review his eligibility and confirm enrollment in their program.   Spoke to patient who states that he has already received the documents and his wife is currently working on getting them filled out. He plans to fax the information in to the foundation. He will give [place] a call when he is ready to fax his completed application so that we can send in the prescriber portion. Patient voiced understanding and denied any questions about his medication at this time.   Will send documents to scan center.  Darlisha Kelm, [place], CPhT 10:29 AM

## 2016-11-19 ENCOUNTER — Telehealth: Payer: Self-pay

## 2016-11-19 NOTE — Telephone Encounter (Signed)
Patient called to tell me that he has filled out his portion of the application to [employer]. Patient wants to fax his documents to the clinic. I gave him the fax number. Patient's wife will fax [place] the documents tomorrow. After all information has been collected, we will fax the application to the foundation.   Patient voiced understanding and denied any questions at this time.   Codie Hainer, [place], CPhT 2:01 PM

## 2016-11-22 NOTE — Telephone Encounter (Signed)
Received a copy of patients financial documents and consent form for patient's [employer] patient assistance application. Called patient to let him know it was received.   The application was faxed to foundation. We will update once we receive a response.  Itzayanna Kaster, [place], CPhT 4:28 PM

## 2016-12-05 ENCOUNTER — Telehealth: Payer: Self-pay

## 2016-12-05 NOTE — Telephone Encounter (Signed)
Received a fax from [facility] Patient Assistance stating that the patient continues to meet the program eligibility and they have confirmed enrollment in their program through 04/28/2017.   Phone number: [phone]  Called patient. Left message with an update.   Will send document to scan center.  Jaiyla Granados, [place], CPhT 8:56 AM

## 2016-12-10 ENCOUNTER — Other Ambulatory Visit: Payer: Self-pay | Admitting: Rheumatology

## 2016-12-10 NOTE — Telephone Encounter (Signed)
Last Visit: 09/26/16 Next Visit: 02/26/17  Okay to refill per Dr. Estanislado Pandy

## 2016-12-11 ENCOUNTER — Ambulatory Visit (INDEPENDENT_AMBULATORY_CARE_PROVIDER_SITE_OTHER): Payer: Medicare Other | Admitting: Orthopaedic Surgery

## 2016-12-11 ENCOUNTER — Other Ambulatory Visit: Payer: Self-pay | Admitting: Radiology

## 2016-12-11 ENCOUNTER — Encounter (INDEPENDENT_AMBULATORY_CARE_PROVIDER_SITE_OTHER): Payer: Self-pay | Admitting: Orthopaedic Surgery

## 2016-12-11 ENCOUNTER — Encounter (INDEPENDENT_AMBULATORY_CARE_PROVIDER_SITE_OTHER): Payer: Self-pay

## 2016-12-11 ENCOUNTER — Telehealth: Payer: Self-pay | Admitting: Pharmacist

## 2016-12-11 VITALS — BP 166/101 | HR 95 | Resp 18 | Ht 72.0 in | Wt 345.0 lb

## 2016-12-11 DIAGNOSIS — M25562 Pain in left knee: Secondary | ICD-10-CM | POA: Diagnosis not present

## 2016-12-11 MED ORDER — FOLIC ACID 1 MG PO TABS: 1.0000 mg | ORAL_TABLET | Freq: Every day | ORAL | 4 refills | Status: DC

## 2016-12-11 NOTE — Progress Notes (Signed)
Office Visit Note   Patient: Charles Jenkins           Date of Birth: 1960/05/15           MRN: [id number] Visit Date: 12/11/2016              Requested by: Glenda Chroman, MD [place], [place] [address] PCP: Glenda Chroman, MD   Assessment & Plan: Visit Diagnoses:  1. Acute pain of left knee   Probable distal quadriceps sprain. Possible Internal derangement but without effusion or ecchymosis. X-rays negative  Plan: Activities as tolerated. Office 1 month if no improvement to consider MRI scan. Hopefully problem will resolve on its own. Certainly can try over-the-counter medicines heat or ice.  Follow-Up Instructions: Return if symptoms worsen or fail to improve.   Orders:  No orders of the defined types were placed in this encounter.  No orders of the defined types were placed in this encounter.     Procedures: No procedures performed   Clinical Data: No additional findings.   Subjective: Chief Complaint  Patient presents with  . Left Knee - Injury, Pain, Edema    Mr. Ramiro Harvest is a 54 y o that presents with Left knee pain when he fell 11/27/16 at the [facility]. Went to ED at [facility] for XR, negative.  Mr Platts is with his wife on August 1 when he fell coming out of a [employer]. He apparently missed a step and landed such that he fell backwards. He notes that he landed on his back and also had a hyper flexion injury to his left knee. His knee continued to give him a problem. He was seen at the emergency room [facility] negative x-rays. He still having some discomfort and thus visits the office. This pain is somewhat diffuse but more localized along the anterior aspect of his knee. Does have a limp. He's not had any swelling or ecchymosis. He's had some mild back pain and said prior back surgery notes are presently is doing "well". He also sustained some abrasions along the forearm right upper extremity which are also resolved her denies any previous  problems with his knee  HPI  Review of Systems  Constitutional: Negative for fatigue.  HENT: Negative for hearing loss.   Respiratory: Negative for apnea, chest tightness and shortness of breath.   Cardiovascular: Negative for chest pain, palpitations and leg swelling.  Gastrointestinal: Negative for blood in stool, constipation and diarrhea.  Genitourinary: Negative for difficulty urinating.  Musculoskeletal: Positive for joint swelling. Negative for arthralgias, back pain, myalgias, neck pain and neck stiffness.  Neurological: Positive for weakness. Negative for numbness and headaches.  Hematological: Does not bruise/bleed easily.  Psychiatric/Behavioral: Negative for sleep disturbance. The patient is not nervous/anxious.      Objective: Vital Signs: BP (!) 166/101   Pulse 95   Resp 18   Ht 6' (1.829 m)   Wt (!) 345 lb (156.5 kg)   BMI 46.79 kg/m   Physical Exam  Ortho Exam left knee exam without evidence of effusion or ecchymosis or erythema. There are areas of induration. Does have some patella crepitation but no swelling. Some pain along the distal quadriceps but is able to actively straight leg raise with some mild tenderness across the distal tendon. No evidence of instability. MCL and LCL intact. Negative anterior drawer sign. Normal medial lateral joint pain. Again no areas of skin change or ecchymosis. No swelling distally. Large legs. Straight  leg raise negative bilaterally. No pain on range of motion of either hip. Sensibility distally.  Specialty Comments:  No specialty comments available.  Imaging: No results found.   PMFS History: Patient Active Problem List   Diagnosis Date Noted  . Family history of scleroderma 09/25/2016  . Obesity due to excess calories 09/25/2016  . Primary osteoarthritis of both hands 04/10/2016  . Primary osteoarthritis of both feet 04/10/2016  . High risk medication use 04/09/2016  . 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 Lumbar  spinal stenosis 05/05/2014  . 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 Insomnia 03/10/2013  . Spinal stenosis of lumbar region 10/28/2012  . Sciatica 09/23/2012  . Essential hypertension, benign 07/24/2012  . Sacroiliitis ([place]) 07/24/2012  . Depression 07/24/2012   Past Medical History:  Diagnosis Date  . Ankylosing spondylitis ([place])   . Arthritis   . Back pain   . Chronic right SI joint pain   . Hypertension    "only in doctor's offices"    No family history on file.  Past Surgical History:  Procedure Laterality Date  . LUMBAR LAMINECTOMY/DECOMPRESSION MICRODISCECTOMY Left 10/28/2012   Procedure: MICRO LUMBAR DECOMPRESSION L4 - L5 and L5 - S1 2 LEVELS;  Surgeon: Johnn Hai, MD;  Location: [facility];  Service: Orthopedics;  Laterality: Left;  . LUMBAR LAMINECTOMY/DECOMPRESSION MICRODISCECTOMY N/A 05/04/2014   Procedure: MICRO LUMBAR DECOMPRESSION L2-L3, L3-L4;  Surgeon: Johnn Hai, MD;  Location: [facility];  Service: Orthopedics;  Laterality: N/A;   Social History   Occupational History  . Not on file.   Social History Main Topics  . Smoking status: Former Smoker    Types: Cigars  . Smokeless tobacco: Never Used     Comment: smokes occasional cigar  . Alcohol use No  . Drug use: No  . Sexual activity: Not on file

## 2016-12-11 NOTE — Telephone Encounter (Signed)
Received a Humira refill request from [employer] patient assistance program.  Noted that refill was recently sent in July.  I called patient to follow up.  He confirms he has 3 pens (6 weeks of medication) and does not need refill yet.  He will call us when he is down to his last pen to request refill of his medication.    Elisabeth Most, Pharm.D., BCPS, CPP Clinical Pharmacist Pager: [phone] Phone: [phone] 12/11/2016 3:58 PM

## 2016-12-11 NOTE — Telephone Encounter (Signed)
Refill request received via fax for Folic acid Ok to refill per Dr Estanislado Pandy

## 2016-12-25 ENCOUNTER — Ambulatory Visit (INDEPENDENT_AMBULATORY_CARE_PROVIDER_SITE_OTHER): Payer: Self-pay

## 2016-12-25 ENCOUNTER — Encounter (INDEPENDENT_AMBULATORY_CARE_PROVIDER_SITE_OTHER): Payer: Self-pay | Admitting: Orthopaedic Surgery

## 2016-12-25 ENCOUNTER — Ambulatory Visit (INDEPENDENT_AMBULATORY_CARE_PROVIDER_SITE_OTHER): Payer: Medicare Other | Admitting: Orthopaedic Surgery

## 2016-12-25 VITALS — BP 150/91 | HR 86 | Ht 72.0 in | Wt 345.0 lb

## 2016-12-25 DIAGNOSIS — M25562 Pain in left knee: Secondary | ICD-10-CM

## 2016-12-25 DIAGNOSIS — M79672 Pain in left foot: Secondary | ICD-10-CM | POA: Diagnosis not present

## 2016-12-25 NOTE — Progress Notes (Signed)
Office Visit Note   Patient: Charles Jenkins           Date of Birth: 02-13-1961           MRN: [id number] Visit Date: 12/25/2016              Requested by: Glenda Chroman, MD [place], [place] [address] PCP: Glenda Chroman, MD   Assessment & Plan: Visit Diagnoses:  1. Left knee pain, unspecified chronicity   3 weeks post injury left knee as outlined with persistent pain of his knee giving way still has a limp. Also having some pain in the dorsum of his left foot. Films were negative for fracture today. Plan: We will obtain an MRI scan left knee related to persistent discomfort and compromise of activities  Follow-Up Instructions: No Follow-up on file.   Orders:  Orders Placed This Encounter  Procedures  . MR Knee Left w/o contrast   No orders of the defined types were placed in this encounter.     Procedures: No procedures performed   Clinical Data: No additional findings.   Subjective: Chief Complaint  Patient presents with  . Right Knee - Pain, Edema, Bleeding/Bruising    Charles Jenkins is a 44 y old that has Left knee pain since 11/27/16. His PCP thought he may have a broken blood vessel as his ankle hurts.  Approximately 3 weeks post injury to his left knee as previously outlined. He fell backwards with a hyperflexion injury to his left knee. He's having persistent pain some swelling on the posterior aspect of his knee and some feeling of instability. He also notes recent onset of ecchymosis in the dorsum of his left foot that may or may not be related to his knee. He thinks he may have injured his left foot at the time of his initial injury. Ecchymosis appears to be resolving but still having some residual pain in the dorsum of his left foot  HPI  Review of Systems  Constitutional: Negative for fatigue.  HENT: Negative for hearing loss.   Respiratory: Positive for apnea. Negative for chest tightness and shortness of breath.   Cardiovascular: Negative for chest  pain, palpitations and leg swelling.  Gastrointestinal: Negative for blood in stool, constipation and diarrhea.  Genitourinary: Negative for difficulty urinating.  Musculoskeletal: Negative for arthralgias, back pain, joint swelling, myalgias, neck pain and neck stiffness.  Neurological: Negative for weakness, numbness and headaches.  Hematological: Does not bruise/bleed easily.  Psychiatric/Behavioral: Negative for sleep disturbance. The patient is not nervous/anxious.      Objective: Vital Signs: BP (!) 150/91   Pulse 86   Ht 6' (1.829 m)   Wt (!) 345 lb (156.5 kg)   BMI 46.79 kg/m   Physical Exam  Ortho Exam left knee without effusion. No areas of ecchymosis or mass formation. No erythema. Full extension and flexed over 110. No obvious instability but does have some mild medial lateral joint pain. I could not appreciate an anterior drawer sign. No popliteal mass. Solving ecchymosis in the dorsum of his left foot the area of the third and fourth metatarsals. Small area of pain in the same region. No ankle pain. Skin intact. Neurovascular exam intact.  Specialty Comments:  No specialty comments available.  Imaging: No results found.   PMFS History: Patient Active Problem List   Diagnosis Date Noted  . Family history of scleroderma 09/25/2016  . Obesity due to excess calories 09/25/2016  . Primary osteoarthritis of  both hands 04/10/2016  . Primary osteoarthritis of both feet 04/10/2016  . High risk medication use 04/09/2016  . 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 Lumbar spinal stenosis 05/05/2014  . 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 Insomnia 03/10/2013  . Spinal stenosis of lumbar region 10/28/2012  . Sciatica 09/23/2012  . Essential hypertension, benign 07/24/2012  . Sacroiliitis ([place]) 07/24/2012  . Depression 07/24/2012   Past Medical History:  Diagnosis Date  . Ankylosing spondylitis ([place])     . Arthritis   . Back pain   . Chronic right SI joint pain   . Hypertension    "only in doctor's offices"    History reviewed. No pertinent family history.  Past Surgical History:  Procedure Laterality Date  . LUMBAR LAMINECTOMY/DECOMPRESSION MICRODISCECTOMY Left 10/28/2012   Procedure: MICRO LUMBAR DECOMPRESSION L4 - L5 and L5 - S1 2 LEVELS;  Surgeon: Johnn Hai, MD;  Location: [facility];  Service: Orthopedics;  Laterality: Left;  . LUMBAR LAMINECTOMY/DECOMPRESSION MICRODISCECTOMY N/A 05/04/2014   Procedure: MICRO LUMBAR DECOMPRESSION L2-L3, L3-L4;  Surgeon: Johnn Hai, MD;  Location: [facility];  Service: Orthopedics;  Laterality: N/A;   Social History   Occupational History  . Not on file.   Social History Main Topics  . Smoking status: Former Smoker    Types: Cigars  . Smokeless tobacco: Never Used     Comment: smokes occasional cigar  . Alcohol use No  . Drug use: No  . Sexual activity: Not on file

## 2016-12-27 ENCOUNTER — Encounter: Payer: Self-pay | Admitting: Orthopaedic Surgery

## 2017-01-01 ENCOUNTER — Encounter (INDEPENDENT_AMBULATORY_CARE_PROVIDER_SITE_OTHER): Payer: Self-pay | Admitting: Orthopaedic Surgery

## 2017-01-01 ENCOUNTER — Ambulatory Visit (INDEPENDENT_AMBULATORY_CARE_PROVIDER_SITE_OTHER): Payer: Medicare Other | Admitting: Orthopaedic Surgery

## 2017-01-01 VITALS — BP 153/94 | HR 79 | Resp 14 | Ht 72.0 in | Wt 345.0 lb

## 2017-01-01 DIAGNOSIS — M25562 Pain in left knee: Secondary | ICD-10-CM | POA: Diagnosis not present

## 2017-01-01 DIAGNOSIS — G8929 Other chronic pain: Secondary | ICD-10-CM

## 2017-01-01 NOTE — Progress Notes (Signed)
Office Visit Note   Patient: Charles Jenkins           Date of Birth: 1961-03-22           MRN: [id number] Visit Date: 01/01/2017              Requested by: Glenda Chroman, MD [place], [place] [address] PCP: Glenda Chroman, MD   Assessment & Plan: Visit Diagnoses:  1. Chronic pain of left knee     Plan: MRI  Discussed. Suggest athroscopy for partial med menisectomy anfd debridement of patella for mechanical symptoms. Needs to discuss with wife.  Follow-Up Instructions: Return if symptoms worsen or fail to improve.   Orders:  No orders of the defined types were placed in this encounter.  No orders of the defined types were placed in this encounter.     Procedures: No procedures performed   Clinical Data: No additional findings.   Subjective: Chief Complaint  Patient presents with  . Left Knee - Results    Charles Jenkins is a 56 y o that is here for MRI results of L knee.  MRI scan demonstrates tear of med meniscus and grade 4 chondromalacia of patella. Has symptoms relative to both.  HPI  Review of Systems   Objective: Vital Signs: BP (!) 153/94   Pulse 79   Resp 14   Ht 6' (1.829 m)   Wt (!) 345 lb (156.5 kg)   BMI 46.79 kg/m   Physical Exam  Ortho Exampositive crepitation beneath patella with "catching". Pain at med  jt line. No effusion.n/v intact. Skin intact.FROM compared to right knee. Awake, alert ,oriented times 3  Specialty Comments:  No specialty comments available.  Imaging: No results found.   PMFS History: Patient Active Problem List   Diagnosis Date Noted  . Family history of scleroderma 09/25/2016  . Obesity due to excess calories 09/25/2016  . Primary osteoarthritis of both hands 04/10/2016  . Primary osteoarthritis of both feet 04/10/2016  . High risk medication use 04/09/2016  . 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 Lumbar spinal stenosis 05/05/2014  . 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 Insomnia 03/10/2013  . Spinal stenosis of lumbar region 10/28/2012  . Sciatica 09/23/2012  . Essential hypertension, benign 07/24/2012  . Sacroiliitis ([place]) 07/24/2012  . Depression 07/24/2012   Past Medical History:  Diagnosis Date  . Ankylosing spondylitis ([place])   . Arthritis   . Back pain   . Chronic right SI joint pain   . Hypertension    "only in doctor's offices"    History reviewed. No pertinent family history.  Past Surgical History:  Procedure Laterality Date  . LUMBAR LAMINECTOMY/DECOMPRESSION MICRODISCECTOMY Left 10/28/2012   Procedure: MICRO LUMBAR DECOMPRESSION L4 - L5 and L5 - S1 2 LEVELS;  Surgeon: Johnn Hai, MD;  [facility];  Service: Orthopedics;  Laterality: Left;  . LUMBAR LAMINECTOMY/DECOMPRESSION MICRODISCECTOMY N/A 05/04/2014   Procedure: MICRO LUMBAR DECOMPRESSION L2-L3, L3-L4;  Surgeon: Johnn Hai, MD;  [facility]: [facility];  Service: Orthopedics;  Laterality: N/A;   Social History   Occupational History  . Not on file.   Social History Main Topics  . Smoking status: Former Smoker    Types: Cigars  . Smokeless tobacco: Never Used     Comment: smokes occasional cigar  . Alcohol use No  . Drug use: No  . Sexual activity: Not on file

## 2017-01-02 ENCOUNTER — Other Ambulatory Visit: Payer: Self-pay | Admitting: Rheumatology

## 2017-01-02 DIAGNOSIS — Z79899 Other long term (current) drug therapy: Secondary | ICD-10-CM

## 2017-01-02 NOTE — Telephone Encounter (Signed)
Last Visit: 09/26/16 Next Visit: 02/26/17 Labs: 09/13/16 WNL  Patient to update labs 01/06/17  Okay to refill 30 day supply per Dr. Estanislado Pandy

## 2017-01-08 ENCOUNTER — Encounter (INDEPENDENT_AMBULATORY_CARE_PROVIDER_SITE_OTHER): Payer: Self-pay | Admitting: Orthopedic Surgery

## 2017-01-08 ENCOUNTER — Ambulatory Visit (INDEPENDENT_AMBULATORY_CARE_PROVIDER_SITE_OTHER): Payer: Medicare Other | Admitting: Orthopedic Surgery

## 2017-01-08 VITALS — BP 157/81 | HR 78 | Resp 16 | Ht 72.0 in | Wt 345.0 lb

## 2017-01-08 DIAGNOSIS — M23322 Other meniscus derangements, posterior horn of medial meniscus, left knee: Secondary | ICD-10-CM

## 2017-01-08 NOTE — Progress Notes (Signed)
Office Visit Note   Patient: Charles Jenkins           Date of Birth: May 20, 1960           MRN: [id number] Visit Date: 01/08/2017              Requested by: Glenda Chroman, MD [place], [place] [address] PCP: Glenda Chroman, MD   Assessment & Plan: Visit Diagnoses:  1. Other meniscus derangements, posterior horn of medial meniscus, left knee     Plan:  #1: Arthroscopic debridement of the left knee with medial meniscectomy and chondroplasty patella  Follow-Up Instructions: No Follow-up on file.   Face-to-face time spent with patient was greater than 30 minutes.  Greater than 50% of the time was spent in counseling and coordination of care.  Orders:  No orders of the defined types were placed in this encounter.  No orders of the defined types were placed in this encounter.     Procedures: No procedures performed   Clinical Data: No additional findings.   Subjective: Chief Complaint  Patient presents with  . Left Knee - Pain, Pre-op Exam    Charles Jenkins was with his wife on August 1 when he fell coming out of a [employer]. He apparently missed a step and landed such that he fell backwards. He noted that he landed on his back and also had a hyper flexion injury to his left knee. His knee continued to give him a problem. He was seen at the emergency room [facility] negative x-rays. He was still having some discomfort.  His pain was somewhat diffuse but more localized along the anterior aspect of his knee. Does have a limp. He continued to have pain and discomfort and MRI scan was ordered. This revealed a chondromalacia patella as well as a torn medial meniscus. At his last appointment he chose to talk with his wife about possible surgery. He returns today requesting [place] to schedule his surgery.    Review of Systems  Constitutional: Negative for fatigue.  HENT: Positive for hearing loss.   Respiratory: Negative for apnea, chest tightness and shortness  of breath.   Cardiovascular: Negative for chest pain and palpitations.  Gastrointestinal: Positive for diarrhea. Negative for blood in stool and constipation.  Genitourinary: Negative for difficulty urinating.  Musculoskeletal: Positive for back pain. Negative for arthralgias, joint swelling, myalgias, neck pain and neck stiffness.  Neurological: Negative for weakness, numbness and headaches.  Hematological: Does not bruise/bleed easily.  Psychiatric/Behavioral: Positive for sleep disturbance. The patient is not nervous/anxious.      Objective: Vital Signs: BP (!) 157/81   Pulse 78   Resp 16   Ht 6' (1.829 m)   Wt (!) 345 lb (156.5 kg)   BMI 46.79 kg/m   Physical Exam  Constitutional: He is oriented to person, place, and time. He appears well-developed and well-nourished.  HENT:  Head: Normocephalic and atraumatic.  Eyes: Pupils are equal, round, and reactive to light. EOM are normal.  Pulmonary/Chest: Effort normal.  Neurological: He is alert and oriented to person, place, and time.  Skin: Skin is warm and dry.  Psychiatric: He has a normal mood and affect. His behavior is normal. Judgment and thought content normal.    Ortho Exam  Today's left knee reveals a no real effusion. There is no ecchymosis or erythema. He does some subacromial crepitance with range of motion patellofemoral area. He is tender also over the medial joint  line.  Specialty Comments:  No specialty comments available.  Imaging: No results found.   PMFS History: Patient Active Problem List   Diagnosis Date Noted  . Other meniscus derangements, posterior horn of medial meniscus, left knee 01/08/2017  . Family history of scleroderma 09/25/2016  . Obesity due to excess calories 09/25/2016  . Primary osteoarthritis of both hands 04/10/2016  . Primary osteoarthritis of both feet 04/10/2016  . High risk medication use 04/09/2016  . 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 Lumbar spinal stenosis  05/05/2014  . 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 Insomnia 03/10/2013  . Spinal stenosis of lumbar region 10/28/2012  . Sciatica 09/23/2012  . Essential hypertension, benign 07/24/2012  . Sacroiliitis ([place]) 07/24/2012  . Depression 07/24/2012   Past Medical History:  Diagnosis Date  . Ankylosing spondylitis ([place])   . Arthritis   . Back pain   . Chronic right SI joint pain   . Hypertension    "only in doctor's offices"    History reviewed. No pertinent family history.  Past Surgical History:  Procedure Laterality Date  . LUMBAR LAMINECTOMY/DECOMPRESSION MICRODISCECTOMY Left 10/28/2012   Procedure: MICRO LUMBAR DECOMPRESSION L4 - L5 and L5 - S1 2 LEVELS;  Surgeon: Johnn Hai, MD;  Location: [facility];  Service: Orthopedics;  Laterality: Left;  . LUMBAR LAMINECTOMY/DECOMPRESSION MICRODISCECTOMY N/A 05/04/2014   Procedure: MICRO LUMBAR DECOMPRESSION L2-L3, L3-L4;  Surgeon: Johnn Hai, MD;  Location: [facility];  Service: Orthopedics;  Laterality: N/A;   Social History   Occupational History  . Not on file.   Social History Main Topics  . Smoking status: Former Smoker    Types: Cigars  . Smokeless tobacco: Never Used     Comment: smokes occasional cigar  . Alcohol use No  . Drug use: No  . Sexual activity: Not on file

## 2017-01-13 ENCOUNTER — Telehealth (INDEPENDENT_AMBULATORY_CARE_PROVIDER_SITE_OTHER): Payer: Self-pay | Admitting: Orthopaedic Surgery

## 2017-01-13 ENCOUNTER — Encounter (INDEPENDENT_AMBULATORY_CARE_PROVIDER_SITE_OTHER): Payer: Self-pay | Admitting: Orthopaedic Surgery

## 2017-01-13 NOTE — Telephone Encounter (Signed)
Stop methotrexate one week prior to surgery

## 2017-01-13 NOTE — Telephone Encounter (Signed)
Patients wife calling. Patient having knee surgery, and has stopped Humira, but does not know if he is suppose to stop MTX. Patient did not take MTX Friday because he was not sure he was suppose to. Please call to advise. Wife states if she does not answer you can leave a detailed message on her mobile.

## 2017-01-13 NOTE — Telephone Encounter (Signed)
Pleas advise.

## 2017-01-13 NOTE — Telephone Encounter (Signed)
Called Wife and gave her instructions

## 2017-01-23 ENCOUNTER — Encounter: Payer: Self-pay | Admitting: Orthopaedic Surgery

## 2017-01-23 ENCOUNTER — Telehealth: Payer: Self-pay

## 2017-01-23 DIAGNOSIS — S83242D Other tear of medial meniscus, current injury, left knee, subsequent encounter: Secondary | ICD-10-CM | POA: Diagnosis not present

## 2017-01-23 NOTE — Telephone Encounter (Signed)
Called [facility] [employer] to check the dates for patient renewal applications for [id number]. Spoke to [place] who states that patients who submitted a renewal application from January 2018 to October 1st 2018 will receive a year end form by mail to complete and fax or mail in.(Provider portion not required). If a renewal application has not been submitted, the patient will be required to submit a full application. Patient should receive the document around the first week of November and should return it as soon as possible. If they have not received the form by Thanksgiving, they need to call the foundation. [phone])  Called patient to inform. A renewal application has been submitted so a year end form will be sent for the pt to fill out and send back. Did not get an answer. Left message.   Mariska Daffin, [place], CPhT 9:46 AM

## 2017-01-29 ENCOUNTER — Encounter (INDEPENDENT_AMBULATORY_CARE_PROVIDER_SITE_OTHER): Payer: Self-pay | Admitting: Orthopaedic Surgery

## 2017-01-29 ENCOUNTER — Ambulatory Visit (INDEPENDENT_AMBULATORY_CARE_PROVIDER_SITE_OTHER): Payer: Medicare Other | Admitting: Orthopaedic Surgery

## 2017-01-29 VITALS — BP 153/100 | HR 82 | Resp 14 | Ht 72.0 in | Wt 345.0 lb

## 2017-01-29 DIAGNOSIS — M25562 Pain in left knee: Secondary | ICD-10-CM

## 2017-01-29 DIAGNOSIS — G8929 Other chronic pain: Secondary | ICD-10-CM

## 2017-01-29 NOTE — Progress Notes (Signed)
Office Visit Note   Patient: Charles Jenkins           Date of Birth: [birth year]           MRN: [id number] Visit Date: 01/29/2017              Requested by: Glenda Chroman, MD [place], [place] [address] PCP: Glenda Chroman, MD   Assessment & Plan: Visit Diagnoses:  1. Chronic pain of left knee    6 days status post left knee arthroscopy for a tear of the medial meniscus. Doing quite well. Plan: Activities as tolerated, office 1 month  Follow-Up Instructions: Return in about 1 month (around 03/01/2017).   Orders:  No orders of the defined types were placed in this encounter.  No orders of the defined types were placed in this encounter.     Procedures: No procedures performed   Clinical Data: No additional findings.   Subjective: Chief Complaint  Patient presents with  . Left Knee - Routine Post Op    Charles Jenkins is a 56 y o S/P 6 days Left knee arthroscopy. He relates he is doing much better, one spot at the patella hurts.   Feeling well without fever or chills. Denies chest pain or shortness of breath. He actually notes that now that he's been off the Humira for several weeks he feels "a lot better". He denies any calf pain. No swelling distally. He's not had any trouble with motion of his knee .it still feels just a little "stiff".  HPI  Review of Systems  Constitutional: Negative for fatigue.  HENT: Positive for hearing loss.   Respiratory: Negative for apnea, chest tightness and shortness of breath.   Cardiovascular: Negative for chest pain, palpitations and leg swelling.  Gastrointestinal: Negative for blood in stool, constipation and diarrhea.  Genitourinary: Negative for difficulty urinating.  Musculoskeletal: Negative for arthralgias, back pain, joint swelling, myalgias, neck pain and neck stiffness.  Allergic/Immunologic: Positive for immunocompromised state.  Neurological: Negative for weakness, numbness and headaches.  Hematological: Does not  bruise/bleed easily.  Psychiatric/Behavioral: Negative for sleep disturbance. The patient is not nervous/anxious.      Objective: Vital Signs: BP (!) 153/100   Pulse 82   Resp 14   Ht 6' (1.829 m)   Wt (!) 345 lb (156.5 kg)   BMI 46.79 kg/m   Physical Exam  Ortho Exam  Left knee arthroscopic portals are healing without evidence of infection. No obvious effusion. Minimal medial joint pain. None laterally. No calf pain. No swelling distally. Neurovascular exam intact. Full extension about 95 of flexion.  Specialty Comments:  No specialty comments available.  Imaging: No results found.   PMFS History: Patient Active Problem List   Diagnosis Date Noted  . Other meniscus derangements, posterior horn of medial meniscus, left knee 01/08/2017  . Family history of scleroderma 09/25/2016  . Obesity due to excess calories 09/25/2016  . Primary osteoarthritis of both hands 04/10/2016  . Primary osteoarthritis of both feet 04/10/2016  . High risk medication use 04/09/2016  . 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 Lumbar spinal stenosis 05/05/2014  . 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 Insomnia 03/10/2013  . Spinal stenosis of lumbar region 10/28/2012  . Sciatica 09/23/2012  . Essential hypertension, benign 07/24/2012  . Sacroiliitis ([place]) 07/24/2012  . Depression 07/24/2012   Past Medical History:  Diagnosis Date  . Ankylosing spondylitis ([place])   .  Arthritis   . Back pain   . Chronic right SI joint pain   . Hypertension    "only in doctor's offices"    History reviewed. No pertinent family history.  Past Surgical History:  Procedure Laterality Date  . LUMBAR LAMINECTOMY/DECOMPRESSION MICRODISCECTOMY Left 10/28/2012   Procedure: MICRO LUMBAR DECOMPRESSION L4 - L5 and L5 - S1 2 LEVELS;  Surgeon: Johnn Hai, MD;  Location: [facility];  Service: Orthopedics;  Laterality: Left;  . LUMBAR  LAMINECTOMY/DECOMPRESSION MICRODISCECTOMY N/A 05/04/2014   Procedure: MICRO LUMBAR DECOMPRESSION L2-L3, L3-L4;  Surgeon: Johnn Hai, MD;  [facility]: [facility];  Service: Orthopedics;  Laterality: N/A;   Social History   Occupational History  . Not on file.   Social History Main Topics  . Smoking status: Former Smoker    Types: Cigars  . Smokeless tobacco: Never Used     Comment: smokes occasional cigar  . Alcohol use No  . Drug use: No  . Sexual activity: Not on file

## 2017-02-16 NOTE — Progress Notes (Signed)
Office Visit Note  Patient: Charles Jenkins             Date of Birth: 12-06-1960           MRN: [id number]             PCP: Glenda Chroman, MD Referring: Glenda Chroman, MD Visit Date: 02/26/2017 Occupation: '@GUAROCC'$ @    Subjective:  Pain in hands   History of Present Illness: Charles Jenkins is a 56 y.o. male with history of a spondyloarthropathy and osteoarthritis. He states he had to come off Humira for about 6 weeks as he had left knee joint arthroscopic surgery on 01/23/2017 by Dr. Durward Fortes for poor meniscus. He is gradually recovering from that. Since he has resumed Humira his symptoms have improved. He's been having some stiffness and discomfort in his bilateral hands. The feet pain comes and goes. He denies any episodes of plantar fasciitis or Achilles tendinitis. He continues to have some lower back pain off and on. Currently is not having any sciatica symptoms.  Activities of Daily Living:  Patient reports morning stiffness for 30 minutes.   Patient Denies nocturnal pain.  Difficulty dressing/grooming: Denies Difficulty climbing stairs: Reports Difficulty getting out of chair: Reports Difficulty using hands for taps, buttons, cutlery, and/or writing: Reports   Review of Systems  Constitutional: Negative for fatigue, night sweats and weakness ( ).  HENT: Negative for mouth sores, mouth dryness and nose dryness.   Eyes: Negative for redness and dryness.  Respiratory: Negative for shortness of breath and difficulty breathing.   Cardiovascular: Negative for chest pain, palpitations, hypertension, irregular heartbeat and swelling in legs/feet.  Gastrointestinal: Negative for constipation and diarrhea.  Endocrine: Negative for increased urination.  Musculoskeletal: Positive for arthralgias, joint pain and morning stiffness. Negative for joint swelling, myalgias, muscle weakness, muscle tenderness and myalgias.  Skin: Negative for color change, rash, hair loss, nodules/bumps,  skin tightness, ulcers and sensitivity to sunlight.  Allergic/Immunologic: Negative for susceptible to infections.  Neurological: Negative for dizziness, fainting, memory loss and night sweats.  Hematological: Negative for swollen glands.  Psychiatric/Behavioral: Negative for depressed mood and sleep disturbance. The patient is not nervous/anxious.     PMFS History:  Patient Active Problem List   Diagnosis Date Noted  . Other meniscus derangements, posterior horn of medial meniscus, left knee 01/08/2017  . Family history of scleroderma 09/25/2016  . Obesity due to excess calories 09/25/2016  . Primary osteoarthritis of both hands 04/10/2016  . Primary osteoarthritis of both feet 04/10/2016  . High risk medication use 04/09/2016  . 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 Lumbar spinal stenosis 05/05/2014  . 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 Insomnia 03/10/2013  . Spinal stenosis of lumbar region 10/28/2012  . Sciatica 09/23/2012  . Essential hypertension, benign 07/24/2012  . Sacroiliitis ([place]) 07/24/2012  . Depression 07/24/2012    Past Medical History:  Diagnosis Date  . Ankylosing spondylitis ([place])   . Arthritis   . Back pain   . Chronic right SI joint pain   . Hypertension    "only in doctor's offices"    History reviewed. No pertinent family history. Past Surgical History:  Procedure Laterality Date  . KNEE ARTHROPLASTY    . LUMBAR LAMINECTOMY/DECOMPRESSION MICRODISCECTOMY Left 10/28/2012   Procedure: MICRO LUMBAR DECOMPRESSION L4 - L5 and L5 - S1 2 LEVELS;  Surgeon: Johnn Hai, MD;  [facility];  Service: Orthopedics;  Laterality: Left;  .  LUMBAR LAMINECTOMY/DECOMPRESSION MICRODISCECTOMY N/A 05/04/2014   Procedure: MICRO LUMBAR DECOMPRESSION L2-L3, L3-L4;  Surgeon: Johnn Hai, MD;  [facility];  Service: Orthopedics;  Laterality: N/A;   Social History   Social History Narrative  .  No narrative on file     Objective: Vital Signs: BP (!) 155/75 (BP Location: Left Arm, Patient Position: Sitting, Cuff Size: Normal)   Pulse 70   Resp 17   Ht 6' (1.829 m)   Wt (!) 343 lb (155.6 kg)   BMI 46.52 kg/m    Physical Exam  Constitutional: He is oriented to person, place, and time. He appears well-developed and well-nourished.  HENT:  Head: Normocephalic and atraumatic.  Eyes: Pupils are equal, round, and reactive to light. Conjunctivae and EOM are normal.  Neck: Normal range of motion. Neck supple.  Cardiovascular: Normal rate, regular rhythm and normal heart sounds.   Pulmonary/Chest: Effort normal and breath sounds normal.  Abdominal: Soft. Bowel sounds are normal.  Neurological: He is alert and oriented to person, place, and time.  Skin: Skin is warm and dry. Capillary refill takes less than 2 seconds.  Psychiatric: He has a normal mood and affect. His behavior is normal.  Nursing note and vitals reviewed.    Musculoskeletal Exam: C-spine and thoracic lumbar spine good range of motion. He is some discomfort over the left SI joint. Shoulder joints are good range of motion. Elbow joints MCPs PIPs DIPs with good range of motion. He has some thickening of PIP/DIP joints with no synovitis consistent with osteoarthritis. Hip joints with good range of motion. He is warmth and some swelling in his left knee joint where he had arthroscopic surgery. Ankle joints MTPs PIPs with good range of motion with no synovitis. He had no evidence of acute wrist tendinitis or plantar fasciitis.  CDAI Exam: CDAI Homunculus Exam:   Tenderness:  LLE: tibiofemoral  Swelling:  LLE: tibiofemoral  Joint Counts:  CDAI Tender Joint count: 1 CDAI Swollen Joint count: 1  Global Assessments:  Patient Global Assessment: 5 Provider Global Assessment: 3  CDAI Calculated Score: 10    Investigation: No additional findings.TB Gold: Negative 08/2016 CBC Latest Ref Rng & Units 09/13/2016  04/11/2016 04/27/2014  WBC 3.4 - 10.8 x10E3/uL 7.8 8.1 7.5  Hemoglobin 13.0 - 17.7 g/dL 14.6 14.6 14.0  Hematocrit 37.5 - 51.0 % 45.2 44.3 45.1  Platelets 150 - 379 x10E3/uL 288 320 299   CMP Latest Ref Rng & Units 09/13/2016 04/11/2016 04/27/2014  Glucose 65 - 99 mg/dL 90 114(H) 130(H)  BUN 6 - 24 mg/dL '14 13 14  '$ Creatinine 0.76 - 1.27 mg/dL 0.87 0.84 0.81  Sodium 134 - 144 mmol/L 140 138 140  Potassium 3.5 - 5.2 mmol/L 4.4 4.4 4.2  Chloride 96 - 106 mmol/L 100 104 105  CO2 18 - 29 mmol/L '23 26 27  '$ Calcium 8.7 - 10.2 mg/dL 9.4 9.0 9.1  Total Protein 6.0 - 8.5 g/dL 7.4 7.1 -  Total Bilirubin 0.0 - 1.2 mg/dL 0.3 0.3 -  Alkaline Phos 39 - 117 IU/L 105 81 -  AST 0 - 40 IU/L 21 17 -  ALT 0 - 44 IU/L 23 16 -   Imaging: No results found.  Speciality Comments: No specialty comments available.    Procedures:  No procedures performed Allergies: Patient has no known allergies.   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 He  had recent increased pain and stiffness off Humira. He resumed Humira last week after being off for about 6 weeks for arthroscopic surgery. He's been advised to get eye exam on regular basis. As a spondyloarthropathy can cause eye involvement.  Sacroiliitis [facility]): Improved he has off-and-on discomfort.  High risk medication use - Humira every week and Methotrexate 3 tablets per week, folic acid 1 mg daily, - Plan: CBC with Differential/Platelet, COMPLETE METABOLIC PANEL WITH GFR, CBC with Differential/Platelet, COMPLETE METABOLIC PANEL WITH GFR today and every 3 months. I've advised him to see dermatologist on a yearly basis as Humira increases the risk of skin cancer.  Primary osteoarthritis of both hands: Continues to have some stiffness.  S/P arthroscopic surgery of left knee - 01/23/2017 by Dr. Durward Fortes for torn meniscus. He is still having some  stiffness and discomfort in his left knee.  Primary osteoarthritis of both feet: Doing better  DDD (degenerative disc disease), lumbar - status post fusion - July 2014 and January 2016 by Dr. Maxie Better: Chronic pain  History of hypertension: His blood pressure is a still elevated. He was recently started on antihypertensives. I've advised him to monitor his blood pressure closely and follow up with PCP.  History of obesity: Weight loss diet and exercise was discussed at length. Association of heart disease with autoimmune disease was discussed.  History of colon polyps - Polypectomy October 2017  Family history of scleroderma - In mother     Orders: Orders Placed This Encounter  Procedures  . CBC with Differential/Platelet  . COMPLETE METABOLIC PANEL WITH GFR  . CBC with Differential/Platelet  . COMPLETE METABOLIC PANEL WITH GFR   No orders of the defined types were placed in this encounter.   Face-to-face time spent with patient was 30 minutes.Greater than 50% of time was spent in counseling and coordination of care.  Follow-Up Instructions: Return in about 5 months (around 07/27/2017) for Spondyloarthropathy, Osteoarthritis,DDD.   Bo Merino, MD  Note - This record has been created using Editor, commissioning.  Chart creation errors have been sought, but may not always  have been located. Such creation errors do not reflect on  the standard of medical care.

## 2017-02-19 ENCOUNTER — Ambulatory Visit (INDEPENDENT_AMBULATORY_CARE_PROVIDER_SITE_OTHER): Payer: Medicare Other | Admitting: Orthopaedic Surgery

## 2017-02-19 ENCOUNTER — Encounter (INDEPENDENT_AMBULATORY_CARE_PROVIDER_SITE_OTHER): Payer: Self-pay | Admitting: Orthopaedic Surgery

## 2017-02-19 VITALS — BP 150/89 | HR 74 | Resp 12 | Ht 72.0 in | Wt 345.0 lb

## 2017-02-19 DIAGNOSIS — M25562 Pain in left knee: Secondary | ICD-10-CM

## 2017-02-19 DIAGNOSIS — G8929 Other chronic pain: Secondary | ICD-10-CM

## 2017-02-19 NOTE — Progress Notes (Deleted)
   Office Visit Note   Patient: Charles Jenkins           Date of Birth: 12-Apr-1961           MRN: [id number] Visit Date: 02/19/2017              Requested by: Glenda Chroman, MD [place], [place] [address] PCP: Glenda Chroman, MD   Assessment & Plan: Visit Diagnoses: No diagnosis found.  Plan: ***  Follow-Up Instructions: No Follow-up on file.   Orders:  No orders of the defined types were placed in this encounter.  No orders of the defined types were placed in this encounter.     Procedures: No procedures performed   Clinical Data: No additional findings.   Subjective: Chief Complaint  Patient presents with  . Left Knee - Routine Post Op    Charles Jenkins is a 56 y o S/P 1 month Left knee scope.Pt relates he is doing well.    HPI  Review of Systems  Respiratory: Positive for shortness of breath.      Objective: Vital Signs: BP (!) 150/89   Pulse 74   Resp 12   Ht 6' (1.829 m)   Wt (!) 345 lb (156.5 kg)   BMI 46.79 kg/m   Physical Exam  Ortho Exam  Specialty Comments:  No specialty comments available.  Imaging: No results found.   PMFS History: Patient Active Problem List   Diagnosis Date Noted  . Other meniscus derangements, posterior horn of medial meniscus, left knee 01/08/2017  . Family history of scleroderma 09/25/2016  . Obesity due to excess calories 09/25/2016  . Primary osteoarthritis of both hands 04/10/2016  . Primary osteoarthritis of both feet 04/10/2016  . High risk medication use 04/09/2016  . 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 Lumbar spinal stenosis 05/05/2014  . 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 Insomnia 03/10/2013  . Spinal stenosis of lumbar region 10/28/2012  . Sciatica 09/23/2012  . Essential hypertension, benign 07/24/2012  . Sacroiliitis ([place]) 07/24/2012  . Depression 07/24/2012   Past Medical History:  Diagnosis Date  . Ankylosing  spondylitis ([place])   . Arthritis   . Back pain   . Chronic right SI joint pain   . Hypertension    "only in doctor's offices"    No family history on file.  Past Surgical History:  Procedure Laterality Date  . LUMBAR LAMINECTOMY/DECOMPRESSION MICRODISCECTOMY Left 10/28/2012   Procedure: MICRO LUMBAR DECOMPRESSION L4 - L5 and L5 - S1 2 LEVELS;  Surgeon: Johnn Hai, MD;  [facility];  Service: Orthopedics;  Laterality: Left;  . LUMBAR LAMINECTOMY/DECOMPRESSION MICRODISCECTOMY N/A 05/04/2014   Procedure: MICRO LUMBAR DECOMPRESSION L2-L3, L3-L4;  Surgeon: Johnn Hai, MD;  [facility];  Service: Orthopedics;  Laterality: N/A;   Social History   Occupational History  . Not on file.   Social History Main Topics  . Smoking status: Former Smoker    Types: Cigars  . Smokeless tobacco: Never Used     Comment: smokes occasional cigar  . Alcohol use No  . Drug use: No  . Sexual activity: Not on file

## 2017-02-19 NOTE — Progress Notes (Signed)
Office Visit Note   Patient: Charles Jenkins           Date of Birth: 1960/07/23           MRN: [id number] Visit Date: 02/19/2017              Requested by: Glenda Chroman, MD [place],  [id number] PCP: Glenda Chroman, MD   Assessment & Plan: Visit Diagnoses:  1. Chronic pain of left knee     Plan: One month status post left knee arthroscopy with partial medial meniscectomy. Also had degenerative changes in all 3 compartments. Doing quite well. He will check with Dr. Estanislado Pandy to restart his Humira. We will see back as needed. Long discussion regarding what he might expect over time with his knee given the arthritic changes  Follow-Up Instructions: Return if symptoms worsen or fail to improve.   Orders:  No orders of the defined types were placed in this encounter.  No orders of the defined types were placed in this encounter.     Procedures: No procedures performed   Clinical Data: No additional findings.   Subjective: Chief Complaint  Patient presents with  . Left Knee - Routine Post Op    Mr. Charles Jenkins is a 56 y o S/P 1 month Left knee scope.Pt relates he is doing well.  Doing quite well with minimal discomfort. Does have some a [place] and soreness which could be related to his rheumatoid arthritis. He can now restart the Humira  HPI  Review of Systems   Objective: Vital Signs: BP (!) 150/89   Pulse 74   Resp 12   Ht 6' (1.829 m)   Wt (!) 345 lb (156.5 kg)   BMI 46.79 kg/m   Physical Exam  Ortho Exam awake alert and oriented 3 and comfortable sitting. A little bit stiff when first gets up from sitting position. Left knee arthroscopic portals healing without problem. No effusion. No instability. Full extension about 100 of flexion. No calf pain. Neurovascular exam intact distally. Specialty Comments:  No specialty comments available.  Imaging: No results found.   PMFS History: Patient Active Problem List   Diagnosis Date Noted  .  Other meniscus derangements, posterior horn of medial meniscus, left knee 01/08/2017  . Family history of scleroderma 09/25/2016  . Obesity due to excess calories 09/25/2016  . Primary osteoarthritis of both hands 04/10/2016  . Primary osteoarthritis of both feet 04/10/2016  . High risk medication use 04/09/2016  . 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 Lumbar spinal stenosis 05/05/2014  . 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 Insomnia 03/10/2013  . Spinal stenosis of lumbar region 10/28/2012  . Sciatica 09/23/2012  . Essential hypertension, benign 07/24/2012  . Sacroiliitis ([place]) 07/24/2012  . Depression 07/24/2012   Past Medical History:  Diagnosis Date  . Ankylosing spondylitis ([place])   . Arthritis   . Back pain   . Chronic right SI joint pain   . Hypertension    "only in doctor's offices"    No family history on file.  Past Surgical History:  Procedure Laterality Date  . LUMBAR LAMINECTOMY/DECOMPRESSION MICRODISCECTOMY Left 10/28/2012   Procedure: MICRO LUMBAR DECOMPRESSION L4 - L5 and L5 - S1 2 LEVELS;  Surgeon: Johnn Hai, MD;  [facility];  Service: Orthopedics;  Laterality: Left;  . LUMBAR LAMINECTOMY/DECOMPRESSION MICRODISCECTOMY N/A 05/04/2014   Procedure: MICRO LUMBAR DECOMPRESSION L2-L3, L3-L4;  Surgeon: Doroteo Bradford  Tonita Cong, MD;  Location: [facility];  Service: Orthopedics;  Laterality: N/A;   Social History   Occupational History  . Not on file.   Social History Main Topics  . Smoking status: Former Smoker    Types: Cigars  . Smokeless tobacco: Never Used     Comment: smokes occasional cigar  . Alcohol use No  . Drug use: No  . Sexual activity: Not on file     Garald Balding, MD   Note - This record has been created using [employer].  Chart creation errors have been sought, but may not always  have been located. Such creation errors do not reflect on  the standard of medical  care.

## 2017-02-26 ENCOUNTER — Ambulatory Visit: Payer: Medicare Other | Admitting: Rheumatology

## 2017-02-26 ENCOUNTER — Encounter: Payer: Self-pay | Admitting: Rheumatology

## 2017-02-26 ENCOUNTER — Ambulatory Visit (INDEPENDENT_AMBULATORY_CARE_PROVIDER_SITE_OTHER): Payer: Medicare Other | Admitting: Rheumatology

## 2017-02-26 VITALS — BP 155/75 | HR 70 | Resp 17 | Ht 72.0 in | Wt 343.0 lb

## 2017-02-26 DIAGNOSIS — M47819 Spondylosis without myelopathy or radiculopathy, site unspecified: Secondary | ICD-10-CM

## 2017-02-26 DIAGNOSIS — Z8269 Family history of other diseases of the musculoskeletal system and connective tissue: Secondary | ICD-10-CM | POA: Diagnosis not present

## 2017-02-26 DIAGNOSIS — M4690 Unspecified inflammatory spondylopathy, site unspecified: Secondary | ICD-10-CM | POA: Diagnosis not present

## 2017-02-26 DIAGNOSIS — Z860100 Personal history of colon polyps, unspecified: Secondary | ICD-10-CM

## 2017-02-26 DIAGNOSIS — M5136 Other intervertebral disc degeneration, lumbar region: Secondary | ICD-10-CM

## 2017-02-26 DIAGNOSIS — M51369 Other intervertebral disc degeneration, lumbar region without mention of lumbar back pain or lower extremity pain: Secondary | ICD-10-CM

## 2017-02-26 DIAGNOSIS — Z8639 Personal history of other endocrine, nutritional and metabolic disease: Secondary | ICD-10-CM | POA: Diagnosis not present

## 2017-02-26 DIAGNOSIS — Z9889 Other specified postprocedural states: Secondary | ICD-10-CM

## 2017-02-26 DIAGNOSIS — M19071 Primary osteoarthritis, right ankle and foot: Secondary | ICD-10-CM

## 2017-02-26 DIAGNOSIS — M19042 Primary osteoarthritis, left hand: Secondary | ICD-10-CM | POA: Diagnosis not present

## 2017-02-26 DIAGNOSIS — Z8679 Personal history of other diseases of the circulatory system: Secondary | ICD-10-CM | POA: Diagnosis not present

## 2017-02-26 DIAGNOSIS — M461 Sacroiliitis, not elsewhere classified: Secondary | ICD-10-CM

## 2017-02-26 DIAGNOSIS — M19041 Primary osteoarthritis, right hand: Secondary | ICD-10-CM

## 2017-02-26 DIAGNOSIS — M19072 Primary osteoarthritis, left ankle and foot: Secondary | ICD-10-CM

## 2017-02-26 DIAGNOSIS — Z79899 Other long term (current) drug therapy: Secondary | ICD-10-CM

## 2017-02-26 LAB — CBC WITH DIFFERENTIAL/PLATELET
BASOS PCT: 1.1 %
Basophils Absolute: 77 cells/uL (ref 0–200)
EOS ABS: 378 {cells}/uL (ref 15–500)
Eosinophils Relative: 5.4 %
HCT: 45.2 % (ref 38.5–50.0)
HEMOGLOBIN: 14.9 g/dL (ref 13.2–17.1)
Lymphs Abs: 1554 cells/uL (ref 850–3900)
MCH: 28.9 pg (ref 27.0–33.0)
MCHC: 33 g/dL (ref 32.0–36.0)
MCV: 87.8 fL (ref 80.0–100.0)
MONOS PCT: 12.9 %
MPV: 11.4 fL (ref 7.5–12.5)
NEUTROS ABS: 4088 {cells}/uL (ref 1500–7800)
Neutrophils Relative %: 58.4 %
Platelets: 272 10*3/uL (ref 140–400)
RBC: 5.15 10*6/uL (ref 4.20–5.80)
RDW: 12.3 % (ref 11.0–15.0)
Total Lymphocyte: 22.2 %
WBC: 7 10*3/uL (ref 3.8–10.8)
WBCMIX: 903 {cells}/uL (ref 200–950)

## 2017-02-26 LAB — COMPLETE METABOLIC PANEL WITH GFR
AG Ratio: 1.2 (calc) (ref 1.0–2.5)
ALBUMIN MSPROF: 4.1 g/dL (ref 3.6–5.1)
ALT: 22 U/L (ref 9–46)
AST: 23 U/L (ref 10–35)
Alkaline phosphatase (APISO): 77 U/L (ref 40–115)
BILIRUBIN TOTAL: 0.5 mg/dL (ref 0.2–1.2)
BUN: 13 mg/dL (ref 7–25)
CHLORIDE: 104 mmol/L (ref 98–110)
CO2: 27 mmol/L (ref 20–32)
CREATININE: 0.89 mg/dL (ref 0.70–1.33)
Calcium: 9.4 mg/dL (ref 8.6–10.3)
GFR, EST AFRICAN AMERICAN: 112 mL/min/{1.73_m2} (ref >=60)
GFR, EST NON AFRICAN AMERICAN: 97 mL/min/{1.73_m2} (ref >=60)
GLUCOSE: 92 mg/dL (ref 65–99)
Globulin: 3.4 g/dL (calc) (ref 1.9–3.7)
Potassium: 4.7 mmol/L (ref 3.5–5.3)
Sodium: 140 mmol/L (ref 135–146)
TOTAL PROTEIN: 7.5 g/dL (ref 6.1–8.1)

## 2017-02-26 NOTE — Patient Instructions (Signed)
Standing Labs We placed an order today for your standing lab work.    Please come back and get your standing labs in January and every 3 months  We have open lab Monday through Friday from 8:30-11:30 AM and 1:30-4 PM at the office of Dr. Tanyon Alipio.   The office is located at [address], [address], [place], [place] [address] No appointment is necessary.   Labs are drawn by [facility].  You may receive a bill from [facility] for your lab work. If you have any questions regarding directions or hours of operation,  please call [phone].    

## 2017-02-27 NOTE — Progress Notes (Signed)
Within normal limits

## 2017-03-04 ENCOUNTER — Telehealth: Payer: Self-pay | Admitting: Rheumatology

## 2017-03-04 MED ORDER — ADALIMUMAB 40 MG/0.8ML ~~LOC~~ PSKT: 40.0000 mg | PREFILLED_SYRINGE | SUBCUTANEOUS | 0 refills | Status: DC

## 2017-03-04 NOTE — Telephone Encounter (Signed)
Patient request a refill on Humira to be sent into [facility].

## 2017-03-04 NOTE — Telephone Encounter (Signed)
Last Visit: 02/26/17 Next Visit: 07/30/17 Labs: 02/26/17 WNl TB Gold: 09/26/16 Neg   Okay to refill per Dr. Estanislado Pandy

## 2017-03-10 ENCOUNTER — Other Ambulatory Visit: Payer: Self-pay | Admitting: Rheumatology

## 2017-03-10 NOTE — Telephone Encounter (Signed)
Last Visit: 02/26/17 Next Visit: 07/30/17 Labs: 02/26/17 WNL  Okay to refill per Dr. Estanislado Pandy

## 2017-03-13 ENCOUNTER — Telehealth: Payer: Self-pay

## 2017-03-13 NOTE — Telephone Encounter (Signed)
Patient would like for Rx for Humira to be sent to [facility].  Cb# is [phone].  Please advise.  Thank You.

## 2017-03-14 NOTE — Telephone Encounter (Signed)
Prescription re-faxed to [facility].

## 2017-04-09 ENCOUNTER — Telehealth (INDEPENDENT_AMBULATORY_CARE_PROVIDER_SITE_OTHER): Payer: Self-pay | Admitting: Orthopaedic Surgery

## 2017-04-09 NOTE — Telephone Encounter (Signed)
Patients wife Juliann Pulse, called requesting to get copy of records. I informed her that we need to have pts signed authorization. They are in [place] and I stated he can go to our [facility] to sign the AR form and it will be forwarded here to me.

## 2017-04-18 ENCOUNTER — Other Ambulatory Visit: Payer: Self-pay | Admitting: Rheumatology

## 2017-04-18 NOTE — Telephone Encounter (Signed)
Last Visit: 02/26/17 Next Visit: 07/30/17 Labs: 02/26/17 WNL  Okay to refill per Dr. Estanislado Pandy

## 2017-04-28 ENCOUNTER — Telehealth: Payer: Self-pay

## 2017-04-28 NOTE — Telephone Encounter (Signed)
Patients wife returned call. Gave her fax number. Application was faxed. Will update once we received a response.   Fax: [phone]

## 2017-04-28 NOTE — Telephone Encounter (Signed)
Received a fax from [facility] patient assistance. Patient needs to complete a short 3 question application to extend his enrollment through 04/28/2018.   Called patient to update. He states that his wife was working on it but have not sent it in. He would like for a copy of the application faxed to his wife so they could complete it today. He will call me back with a fax number.   Will update once we receive a response.   Javaeh Muscatello, [place], CPhT 9:42 AM

## 2017-05-02 NOTE — Telephone Encounter (Signed)
Received a fax from [facility] stating that the pt has been approved to receive HUMIRA through the foundation through 04/28/2018 at no cost.   Called pt to update. Pt voices understanding and denies any questions at this time.  Will send document to scan center.  Kymoni Lesperance, [place], CPhT 10:41 AM

## 2017-05-13 ENCOUNTER — Other Ambulatory Visit: Payer: Self-pay | Admitting: *Deleted

## 2017-05-13 ENCOUNTER — Telehealth: Payer: Self-pay | Admitting: Rheumatology

## 2017-05-13 DIAGNOSIS — Z79899 Other long term (current) drug therapy: Secondary | ICD-10-CM

## 2017-05-13 NOTE — Telephone Encounter (Signed)
Patient going to [place] near [facility] for labs this week. Please release orders. Call patient with questions, or concerns.

## 2017-05-13 NOTE — Telephone Encounter (Signed)
Lab Orders have been released.  

## 2017-06-03 LAB — CMP14+EGFR
ALBUMIN: 4 g/dL (ref 3.5–5.5)
ALT: 17 IU/L (ref 0–44)
AST: 21 IU/L (ref 0–40)
Albumin/Globulin Ratio: 1.2 (ref 1.2–2.2)
Alkaline Phosphatase: 87 IU/L (ref 39–117)
BUN/Creatinine Ratio: 16 (ref 9–20)
BUN: 14 mg/dL (ref 6–24)
Bilirubin Total: 0.2 mg/dL (ref 0.0–1.2)
CALCIUM: 9.4 mg/dL (ref 8.7–10.2)
CO2: 24 mmol/L (ref 20–29)
CREATININE: 0.89 mg/dL (ref 0.76–1.27)
Chloride: 101 mmol/L (ref 96–106)
GFR calc Af Amer: 112 mL/min/{1.73_m2} (ref >59)
GFR, EST NON AFRICAN AMERICAN: 97 mL/min/{1.73_m2} (ref >59)
Globulin, Total: 3.4 g/dL (ref 1.5–4.5)
Glucose: 118 mg/dL — ABNORMAL HIGH (ref 65–99)
Potassium: 4.8 mmol/L (ref 3.5–5.2)
SODIUM: 140 mmol/L (ref 134–144)
Total Protein: 7.4 g/dL (ref 6.0–8.5)

## 2017-06-03 LAB — CBC WITH DIFFERENTIAL/PLATELET
BASOS: 1 %
Basophils Absolute: 0.1 10*3/uL (ref 0.0–0.2)
EOS (ABSOLUTE): 0.4 10*3/uL (ref 0.0–0.4)
EOS: 6 %
HEMATOCRIT: 43.9 % (ref 37.5–51.0)
HEMOGLOBIN: 14.7 g/dL (ref 13.0–17.7)
IMMATURE GRANULOCYTES: 0 %
Immature Grans (Abs): 0 10*3/uL (ref 0.0–0.1)
Lymphocytes Absolute: 1.8 10*3/uL (ref 0.7–3.1)
Lymphs: 24 %
MCH: 29.2 pg (ref 26.6–33.0)
MCHC: 33.5 g/dL (ref 31.5–35.7)
MCV: 87 fL (ref 79–97)
MONOCYTES: 12 %
MONOS ABS: 0.9 10*3/uL (ref 0.1–0.9)
NEUTROS PCT: 57 %
Neutrophils Absolute: 4.2 10*3/uL (ref 1.4–7.0)
Platelets: 317 10*3/uL (ref 150–379)
RBC: 5.03 x10E6/uL (ref 4.14–5.80)
RDW: 14.2 % (ref 12.3–15.4)
WBC: 7.4 10*3/uL (ref 3.4–10.8)

## 2017-06-11 ENCOUNTER — Encounter (INDEPENDENT_AMBULATORY_CARE_PROVIDER_SITE_OTHER): Payer: Self-pay | Admitting: Orthopaedic Surgery

## 2017-06-11 ENCOUNTER — Ambulatory Visit (INDEPENDENT_AMBULATORY_CARE_PROVIDER_SITE_OTHER): Payer: Medicare Other | Admitting: Orthopaedic Surgery

## 2017-06-11 ENCOUNTER — Ambulatory Visit (INDEPENDENT_AMBULATORY_CARE_PROVIDER_SITE_OTHER): Payer: Self-pay

## 2017-06-11 VITALS — BP 160/88 | HR 85 | Resp 16 | Ht 69.0 in | Wt 343.0 lb

## 2017-06-11 DIAGNOSIS — S8002XA Contusion of left knee, initial encounter: Secondary | ICD-10-CM | POA: Diagnosis not present

## 2017-06-11 DIAGNOSIS — M25562 Pain in left knee: Secondary | ICD-10-CM | POA: Diagnosis not present

## 2017-06-11 NOTE — Progress Notes (Signed)
Office Visit Note   Patient: Charles Jenkins           Date of Birth: 12/10/1960           MRN: [id number] Visit Date: 06/11/2017              Requested by: Glenda Chroman, MD [place], [place] [address] PCP: Glenda Chroman, MD   Assessment & Plan: Visit Diagnoses:  1. Acute pain of left knee   2. Contusion of left knee, initial encounter     Plan: #1: This time since he is improvement he complains of stiffness were going to stay with conservative treatment. #2: Early in the future if he continues to have discomfort and is not resolved then we could certainly reevaluate him and possibly order MRI scan if necessary.  Follow-Up Instructions: No Follow-up on file.   Orders:  Orders Placed This Encounter  Procedures  . XR KNEE 3 VIEW LEFT   No orders of the defined types were placed in this encounter.     Procedures: No procedures performed   Clinical Data: No additional findings.   Subjective: Chief Complaint  Patient presents with  . Left Knee - Pain, Routine Post Op    Charles Jenkins is a 38 y o here today for a fall one weeks ago when he tripped over his dog., and landed on a woodpile.  Hx of Left knee arthroscopy 12/2016.      HPI  Charles Jenkins is a very pleasant 57 year old white male who is seen today for evaluation of his knee.  He states that a little over a week ago he was out his dog and apparently tripped over the dog did onto a wood pile striking his left knee into the wood pile.  He has been having some pain and discomfort in the anterior portion of his knee and some posteriorly.  Had completed an arthroscopic debridement in September 2018 and was concerned of the fact that he may have the surgical knee.  He states his main complaints are that he does have some pain at nighttime that would keep him awake.  Some stiffness after standing for periods of time.  He has been using Advil and elevation which have been beneficial.  He denies any ability  symptoms.  Review of Systems  Constitutional: Negative for fatigue.  HENT: Negative for hearing loss.   Respiratory: Positive for apnea. Negative for chest tightness and shortness of breath.   Cardiovascular: Negative for chest pain, palpitations and leg swelling.  Gastrointestinal: Negative for blood in stool, constipation and diarrhea.  Genitourinary: Negative for difficulty urinating.  Musculoskeletal: Positive for arthralgias. Negative for joint swelling, myalgias, neck pain and neck stiffness.  Neurological: Negative for weakness, numbness and headaches.  Hematological: Does not bruise/bleed easily.  Psychiatric/Behavioral: Positive for sleep disturbance. The patient is not nervous/anxious.      Objective: Vital Signs: BP (!) 160/88   Pulse 85   Resp 16   Ht 5\' 9"  (1.753 m)   Wt (!) 343 lb (155.6 kg)   BMI 50.65 kg/m   Physical Exam  Constitutional: He is oriented to person, place, and time. He appears well-developed and well-nourished.  HENT:  Head: Normocephalic and atraumatic.  Eyes: EOM are normal. Pupils are equal, round, and reactive to light.  Pulmonary/Chest: Effort normal.  Neurological: He is alert and oriented to person, place, and time.  Skin: Skin is warm and dry.  Psychiatric: He has a normal  mood and affect. His behavior is normal. Judgment and thought content normal.    Ortho Exam  Exam today reveals the need to be quite benign.  May be a trace effusion at best.  He does have some tenderness to palpation over the anterior patella more at the insertion of the patellar tendon and the inferior portion of the patella.  A bit of patellofemoral crepitance.  Appears ligamentously stable.  Some tenderness to palpation posteriorly also.  Supple nontender.  Neurovascular intact distally.  Specialty Comments:  No specialty comments available.  Imaging: X-rays of the left knee reveals change from his previous x-rays.  He does have a little bit of arthritis noted.   But no acute findings at this time.  PMFS History: Patient Active Problem List   Diagnosis Date Noted  . Other meniscus derangements, posterior horn of medial meniscus, left knee 01/08/2017  . Family history of scleroderma 09/25/2016  . Obesity due to excess calories 09/25/2016  . Primary osteoarthritis of both hands 04/10/2016  . Primary osteoarthritis of both feet 04/10/2016  . High risk medication use 04/09/2016  . 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 Lumbar spinal stenosis 05/05/2014  . 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 Insomnia 03/10/2013  . Spinal stenosis of lumbar region 10/28/2012  . Sciatica 09/23/2012  . Essential hypertension, benign 07/24/2012  . Sacroiliitis ([place]) 07/24/2012  . Depression 07/24/2012   Past Medical History:  Diagnosis Date  . Ankylosing spondylitis ([place])   . Arthritis   . Back pain   . Chronic right SI joint pain   . Hypertension    "only in doctor's offices"    History reviewed. No pertinent family history.  Past Surgical History:  Procedure Laterality Date  . KNEE ARTHROPLASTY    . LUMBAR LAMINECTOMY/DECOMPRESSION MICRODISCECTOMY Left 10/28/2012   Procedure: MICRO LUMBAR DECOMPRESSION L4 - L5 and L5 - S1 2 LEVELS;  Surgeon: Johnn Hai, MD;  Location: [facility];  Service: Orthopedics;  Laterality: Left;  . LUMBAR LAMINECTOMY/DECOMPRESSION MICRODISCECTOMY N/A 05/04/2014   Procedure: MICRO LUMBAR DECOMPRESSION L2-L3, L3-L4;  Surgeon: Johnn Hai, MD;  Location: [facility];  Service: Orthopedics;  Laterality: N/A;   Social History   Occupational History  . Not on file  Tobacco Use  . Smoking status: Former Smoker    Types: Cigars  . Smokeless tobacco: Current User    Types: Snuff  . Tobacco comment: smokes occasional cigar  Substance and Sexual Activity  . Alcohol use: No  . Drug use: No  . Sexual activity: Not on file

## 2017-07-09 ENCOUNTER — Other Ambulatory Visit: Payer: Self-pay | Admitting: Rheumatology

## 2017-07-09 NOTE — Telephone Encounter (Signed)
Last Visit: 02/26/17 Next Visit: 07/30/17 Labs: 06/02/17 Glucose is mildly elevated. All other labs are WNL.  Okay to refill per Dr. Estanislado Pandy

## 2017-07-17 NOTE — Progress Notes (Signed)
Office Visit Note  Patient: Charles Jenkins             Date of Birth: 27-Apr-1961           MRN: [id number]             PCP: Glenda Chroman, MD Referring: Glenda Chroman, MD Visit Date: 07/30/2017 Occupation: '@GUAROCC'$ @    Subjective Trigger fingers   History of Present Illness: Charles Jenkins is a 57 y.o. male with history of spondyloarthropathy, osteoarthritis, and DDD.  Patient states he continues to inject Humira every other week and takes MTX 3 tablets once weekly, and folic acid 1 mg daily.  He denies missing any doses.  He states that the day before his next injection of Humira he gets increased pain and stiffness in multiple joints, but these symptoms resolve after his injection. He had his last Humira injection yesterday.  He denies any joint pain or joint swelling.  He states he has stiffness in his lower back and bilateral hands.  He continues to have achilles tendinitis intermittently, worse if he is standing or sitting for prolonged periods of times. He states he continues to have chronic lower back pain and SI joint pain bilaterally.  He states when the pain is severe it is a 7/10 and depends on his level of activity.  He denies taking anything for the pain when it is severe.  He states his left knee is doing well.  He denies any mechanical symptoms or swelling.  He does have stiffness in his left knee every morning.  He has developed trigger fingers in his bilateral 2nd and 3rd fingers.  He states the locking sensation is worse in his right hand.  He states it interferes with eating, and the locking has been becoming more frequent.      Activities of Daily Living:  Patient reports morning stiffness for 30 minutes.   Patient Denies nocturnal pain.  Difficulty dressing/grooming: Denies Difficulty climbing stairs: Reports Difficulty getting out of chair: Reports Difficulty using hands for taps, buttons, cutlery, and/or writing: Reports   Review of Systems  Constitutional:  Negative for fatigue and night sweats.  HENT: Negative for mouth sores, mouth dryness and nose dryness.   Eyes: Negative for redness, visual disturbance and dryness.  Respiratory: Negative for cough, hemoptysis, shortness of breath and difficulty breathing.   Cardiovascular: Negative for chest pain, palpitations, hypertension, irregular heartbeat and swelling in legs/feet.  Gastrointestinal: Negative for blood in stool, constipation and diarrhea.  Endocrine: Negative for increased urination.  Genitourinary: Negative for painful urination.  Musculoskeletal: Positive for arthralgias, joint pain and morning stiffness. Negative for joint swelling, myalgias, muscle weakness, muscle tenderness and myalgias.  Skin: Negative for color change, rash, hair loss, nodules/bumps, skin tightness, ulcers and sensitivity to sunlight.  Allergic/Immunologic: Negative for susceptible to infections.  Neurological: Negative for dizziness, fainting, memory loss, night sweats and weakness.  Hematological: Negative for swollen glands.  Psychiatric/Behavioral: Negative for depressed mood and sleep disturbance. The patient is not nervous/anxious.     PMFS History:  Patient Active Problem List   Diagnosis Date Noted  . Other meniscus derangements, posterior horn of medial meniscus, left knee 01/08/2017  . Family history of scleroderma 09/25/2016  . Obesity due to excess calories 09/25/2016  . Primary osteoarthritis of both hands 04/10/2016  . Primary osteoarthritis of both feet 04/10/2016  . High risk medication use 04/09/2016  . 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 Lumbar spinal stenosis 05/05/2014  .  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 Insomnia 03/10/2013  . Spinal stenosis of lumbar region 10/28/2012  . Sciatica 09/23/2012  . Essential hypertension, benign 07/24/2012  . Sacroiliitis ([place]) 07/24/2012  . Depression 07/24/2012    Past  Medical History:  Diagnosis Date  . Ankylosing spondylitis ([place])   . Arthritis   . Back pain   . Chronic right SI joint pain   . Hypertension    "only in doctor's offices"    Family History  Problem Relation Age of Onset  . Scleroderma Mother    Past Surgical History:  Procedure Laterality Date  . KNEE ARTHROPLASTY Left 2018  . LUMBAR LAMINECTOMY/DECOMPRESSION MICRODISCECTOMY Left 10/28/2012   Procedure: MICRO LUMBAR DECOMPRESSION L4 - L5 and L5 - S1 2 LEVELS;  Surgeon: Johnn Hai, MD;  Location: [facility];  Service: Orthopedics;  Laterality: Left;  . LUMBAR LAMINECTOMY/DECOMPRESSION MICRODISCECTOMY N/A 05/04/2014   Procedure: MICRO LUMBAR DECOMPRESSION L2-L3, L3-L4;  Surgeon: Johnn Hai, MD;  Location: [facility];  Service: Orthopedics;  Laterality: N/A;   Social History   Social History Narrative  . Not on file     Objective: Vital Signs: BP (!) 142/85 (BP Location: Left Arm, Patient Position: Sitting, Cuff Size: Large)   Pulse 76   Resp 17   Ht 6' (1.829 m)   Wt (!) 375 lb (170.1 kg) Comment: per patient  BMI 50.86 kg/m    Physical Exam  Constitutional: He is oriented to person, place, and time. He appears well-developed and well-nourished.  HENT:  Head: Normocephalic and atraumatic.  Eyes: Pupils are equal, round, and reactive to light. Conjunctivae and EOM are normal.  Neck: Normal range of motion. Neck supple.  Cardiovascular: Normal rate, regular rhythm and normal heart sounds.  Pulmonary/Chest: Effort normal and breath sounds normal.  Abdominal: Soft. Bowel sounds are normal.  Lymphadenopathy:    He has no cervical adenopathy.  Neurological: He is alert and oriented to person, place, and time.  Skin: Skin is warm and dry. Capillary refill takes less than 2 seconds.  Psychiatric: He has a normal mood and affect. His behavior is normal.  Nursing note and vitals reviewed.    Musculoskeletal Exam: C-spine good range of motion. Mild thoracic kyphosis. limited  range of motion of lumbar spine.  He has midline spinal tenderness in the lumbar region and bilateral SI joints.  Shoulder joints, elbow joints, wrist joints, MCPs, PIPs, DIPs good range of motion with no synovitis.  He has trigger fingers of his bilateral second and third digits.  Hip joints, knee joints, ankle joints, MTPs, PIPs, DIPs good range of motion with no synovitis.  No warmth or effusion of bilateral knees.  No tenderness of trochanteric bursa.  CDAI Exam: No CDAI exam completed.    Investigation: No additional findings.TB Gold: 09/26/2016 Negative  CBC Latest Ref Rng & Units 06/02/2017 02/26/2017 09/13/2016  WBC 3.4 - 10.8 x10E3/uL 7.4 7.0 7.8  Hemoglobin 13.0 - 17.7 g/dL 14.7 14.9 14.6  Hematocrit 37.5 - 51.0 % 43.9 45.2 45.2  Platelets 150 - 379 x10E3/uL 317 272 288   CMP Latest Ref Rng & Units 06/02/2017 02/26/2017 09/13/2016  Glucose 65 - 99 mg/dL 118(H) 92 90  BUN 6 - 24 mg/dL '14 13 14  '$ Creatinine 0.76 - 1.27 mg/dL 0.89 0.89 0.87  Sodium 134 - 144 mmol/L 140 140 140  Potassium 3.5 - 5.2 mmol/L 4.8 4.7 4.4  Chloride 96 - 106 mmol/L  101 104 100  CO2 20 - 29 mmol/L '24 27 23  '$ Calcium 8.7 - 10.2 mg/dL 9.4 9.4 9.4  Total Protein 6.0 - 8.5 g/dL 7.4 7.5 7.4  Total Bilirubin 0.0 - 1.2 mg/dL 0.2 0.5 0.3  Alkaline Phos 39 - 117 IU/L 87 - 105  AST 0 - 40 IU/L '21 23 21  '$ ALT 0 - 44 IU/L '17 22 23   '$ Imaging: No results found.  Speciality Comments: No specialty comments available.    Procedures:  Hand/UE Inj: R index A1 for trigger finger on 07/30/2017 10:14 AM Indications: pain Details: 27 G needle, ultrasound-guided volar approach Medications: 0.5 mL lidocaine 1 %; 10 mg triamcinolone acetonide 40 MG/ML Aspirate: 0 mL Outcome: tolerated well, no immediate complications Procedure, treatment alternatives, risks and benefits explained, specific risks discussed. Consent was given by the patient. Immediately prior to procedure a time out was called to verify the correct patient,  procedure, equipment, support staff and site/side marked as required. Patient was prepped and draped in the usual sterile fashion.   Hand/UE Inj: R long A1 for trigger finger on 07/30/2017 10:16 AM Indications: pain Details: 27 G needle, ultrasound-guided volar approach Medications: 0.5 mL lidocaine 1 %; 10 mg triamcinolone acetonide 40 MG/ML Aspirate: 0 mL Outcome: tolerated well, no immediate complications Procedure, treatment alternatives, risks and benefits explained, specific risks discussed. Consent was given by the patient. Immediately prior to procedure a time out was called to verify the correct patient, procedure, equipment, support staff and site/side marked as required. Patient was prepped and draped in the usual sterile fashion.     Allergies: Patient has no known allergies.   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  He continues to have chronic lower back pain and SI joint pain bilaterally.  He has intermittent Achilles tendinitis if he is standing for prolonged periods of time.  He develops increased joint pain and stiffness the day before he is due for his next Humira injection.  Sacroiliitis [facility]): Bilateral SI joint tenderness on exam.  His SI joint pain depends on his level of activity.  High risk medication use - Humira, MTX, folic acid.  CBC, CMP and TB gold will be drawn in May.  Standing orders for CBC and CMP every 3 months are placed to monitor for drug toxicity.- Plan: QuantiFERON-TB Gold Plus  Primary osteoarthritis of both hands: He has no discomfort in his hands at this time.  He has some stiffness in his hands.  No synovitis was noted.  Primary osteoarthritis of both feet: No discomfort at this time.  He wears proper fitting shoes.  S/P arthroscopic surgery of  left knee - 01/23/2017 by Dr. Durward Fortes for torn meniscus.  He continues to improve.  He has some stiffness in his left knee especially in the morning.  He has left knee crepitus on exam.  No mechanical symptoms or discomfort at this time.  DDD (degenerative disc disease), lumbar - status post fusion -chronic pain.  July 2014 and January 2016 by Dr. Maxie Better.  He has midline spinal tenderness in the lumbar region.  He has stiffness in his lumbar spine.  Trigger finger, right middle finger:The locking sensation has become more frequent and has been causing significant discomfort. A cortisone injection was performed today in the office.  He tolerated the  procedure well.  A splint was placed following the procedure.  He was advised to monitor his blood pressure closely following the cortisone injection today.  Trigger index finger of right hand: A cortisone injection was performed today in the office.  He tolerated the procedure well.  A splint was placed.  Other medical conditions are listed as follows:  History of colon polyps - Polypectomy October 2017  History of hypertension: He was advised to monitor his blood pressure closely following the cortisone injections today.  History of obesity  Family history of scleroderma - In mother      Orders: Orders Placed This Encounter  Procedures  . Hand/UE Inj  . Hand/UE Inj  . QuantiFERON-TB Gold Plus   No orders of the defined types were placed in this encounter.   Face-to-face time spent with patient was 30 minutes. >50% of time was spent in counseling and coordination of care.  Follow-Up Instructions: Return in about 5 months (around 12/30/2017) for Spondyloarthropathy, Osteoarthritis, DDD.   Ofilia Neas, PA-C  I examined and evaluated the patient with Hazel Sams PA.  Patient is doing quite well on Humira and methotrexate combination.  On my examination he had a right second and third trigger finger.  Different treatment options were  discussed and the cortisone injection was given as described above.  The plan of care was discussed as noted above.  Bo Merino, MD Note - This record has been created using Editor, commissioning.  Chart creation errors have been sought, but may not always  have been located. Such creation errors do not reflect on  the standard of medical care.

## 2017-07-30 ENCOUNTER — Ambulatory Visit: Payer: Medicare Other | Admitting: Rheumatology

## 2017-07-30 ENCOUNTER — Encounter: Payer: Self-pay | Admitting: Rheumatology

## 2017-07-30 VITALS — BP 142/85 | HR 76 | Resp 17 | Ht 72.0 in | Wt 375.0 lb

## 2017-07-30 DIAGNOSIS — M65331 Trigger finger, right middle finger: Secondary | ICD-10-CM

## 2017-07-30 DIAGNOSIS — M19041 Primary osteoarthritis, right hand: Secondary | ICD-10-CM

## 2017-07-30 DIAGNOSIS — Z9889 Other specified postprocedural states: Secondary | ICD-10-CM

## 2017-07-30 DIAGNOSIS — M461 Sacroiliitis, not elsewhere classified: Secondary | ICD-10-CM | POA: Diagnosis not present

## 2017-07-30 DIAGNOSIS — M5136 Other intervertebral disc degeneration, lumbar region: Secondary | ICD-10-CM | POA: Diagnosis not present

## 2017-07-30 DIAGNOSIS — M4690 Unspecified inflammatory spondylopathy, site unspecified: Secondary | ICD-10-CM

## 2017-07-30 DIAGNOSIS — M19042 Primary osteoarthritis, left hand: Secondary | ICD-10-CM

## 2017-07-30 DIAGNOSIS — Z79899 Other long term (current) drug therapy: Secondary | ICD-10-CM

## 2017-07-30 DIAGNOSIS — Z8269 Family history of other diseases of the musculoskeletal system and connective tissue: Secondary | ICD-10-CM

## 2017-07-30 DIAGNOSIS — M19071 Primary osteoarthritis, right ankle and foot: Secondary | ICD-10-CM

## 2017-07-30 DIAGNOSIS — Z860100 Personal history of colon polyps, unspecified: Secondary | ICD-10-CM

## 2017-07-30 DIAGNOSIS — Z8679 Personal history of other diseases of the circulatory system: Secondary | ICD-10-CM

## 2017-07-30 DIAGNOSIS — Z8639 Personal history of other endocrine, nutritional and metabolic disease: Secondary | ICD-10-CM | POA: Diagnosis not present

## 2017-07-30 DIAGNOSIS — M19072 Primary osteoarthritis, left ankle and foot: Secondary | ICD-10-CM

## 2017-07-30 DIAGNOSIS — M65321 Trigger finger, right index finger: Secondary | ICD-10-CM | POA: Diagnosis not present

## 2017-07-30 DIAGNOSIS — M47819 Spondylosis without myelopathy or radiculopathy, site unspecified: Secondary | ICD-10-CM

## 2017-07-30 DIAGNOSIS — M51369 Other intervertebral disc degeneration, lumbar region without mention of lumbar back pain or lower extremity pain: Secondary | ICD-10-CM

## 2017-07-30 MED ORDER — TRIAMCINOLONE ACETONIDE 40 MG/ML IJ SUSP
10.0000 mg | INTRAMUSCULAR | Status: AC | PRN
  Administered 2017-07-30: 10 mg

## 2017-07-30 MED ORDER — LIDOCAINE HCL 1 % IJ SOLN
0.5000 mL | INTRAMUSCULAR | Status: AC | PRN
  Administered 2017-07-30: .5 mL

## 2017-07-30 NOTE — Patient Instructions (Addendum)
Standing Labs We placed an order today for your standing lab work.    Please come back and get your standing labs in May and every 3 months  CBC, CMP and TB gold due   We have open lab Monday through Friday from 8:30-11:30 AM and 1:30-4:00 PM  at the office of Dr. Bo Merino.   You may experience shorter wait times on Monday and Friday afternoons. The office is located at [address] [place], [place], [place] [address] No appointment is necessary.   Labs are drawn by [facility].  You may receive a bill from [facility] for your lab work. If you have any questions regarding directions or hours of operation,  please call [phone].

## 2017-08-07 ENCOUNTER — Telehealth: Payer: Self-pay | Admitting: Rheumatology

## 2017-08-07 NOTE — Telephone Encounter (Signed)
Attempted to call patient and left message on machine for patient to call the office.  

## 2017-08-07 NOTE — Telephone Encounter (Signed)
Patient called stating he cut his hand with a wood splitter yesterday and went to the hospital and the physician gave him an antibiotic (Keflex).  Patient states he is currently taking Humira and wanted to check with Dr. Estanislado Pandy to make sure it was okay to take the antibiotic with the Humira.

## 2017-08-07 NOTE — Telephone Encounter (Signed)
I would recommend that he finish Keflex and allow his wound to heal fully before resuming Humira injections.  He should not resume his Humira if there is any signs of infection.

## 2017-08-07 NOTE — Telephone Encounter (Signed)
Patient's wife returned the call and she has been advised for patient to finish the Keflex and let the wound heal before resuming Humira. She was also advised that he should not resume Humira if there are any signs of infection. She verbalized understanding.

## 2017-09-17 ENCOUNTER — Telehealth: Payer: Self-pay | Admitting: Rheumatology

## 2017-09-17 NOTE — Telephone Encounter (Signed)
Patient called requesting his labwork orders be sent to [facility] in [place].  Patient is going on Friday, 09/19/17.

## 2017-09-18 ENCOUNTER — Other Ambulatory Visit: Payer: Self-pay

## 2017-09-18 DIAGNOSIS — Z79899 Other long term (current) drug therapy: Secondary | ICD-10-CM

## 2017-09-18 NOTE — Telephone Encounter (Signed)
Lab orders have been placed and released. Patient has been advised.

## 2017-09-26 LAB — CBC WITH DIFFERENTIAL/PLATELET
BASOS ABS: 0.1 10*3/uL (ref 0.0–0.2)
Basos: 1 %
EOS (ABSOLUTE): 0.3 10*3/uL (ref 0.0–0.4)
Eos: 4 %
Hematocrit: 45.1 % (ref 37.5–51.0)
Hemoglobin: 14.9 g/dL (ref 13.0–17.7)
Immature Grans (Abs): 0 10*3/uL (ref 0.0–0.1)
Immature Granulocytes: 0 %
LYMPHS ABS: 2 10*3/uL (ref 0.7–3.1)
Lymphs: 28 %
MCH: 29.9 pg (ref 26.6–33.0)
MCHC: 33 g/dL (ref 31.5–35.7)
MCV: 90 fL (ref 79–97)
Monocytes Absolute: 0.6 10*3/uL (ref 0.1–0.9)
Monocytes: 8 %
NEUTROS ABS: 4.3 10*3/uL (ref 1.4–7.0)
Neutrophils: 59 %
Platelets: 296 10*3/uL (ref 150–450)
RBC: 4.99 x10E6/uL (ref 4.14–5.80)
RDW: 13.8 % (ref 12.3–15.4)
WBC: 7.2 10*3/uL (ref 3.4–10.8)

## 2017-09-26 LAB — CMP14+EGFR
A/G RATIO: 1.1 — AB (ref 1.2–2.2)
ALT: 20 IU/L (ref 0–44)
AST: 19 IU/L (ref 0–40)
Albumin: 4 g/dL (ref 3.5–5.5)
Alkaline Phosphatase: 81 IU/L (ref 39–117)
BILIRUBIN TOTAL: 0.4 mg/dL (ref 0.0–1.2)
BUN / CREAT RATIO: 11 (ref 9–20)
BUN: 9 mg/dL (ref 6–24)
CHLORIDE: 100 mmol/L (ref 96–106)
CO2: 24 mmol/L (ref 20–29)
Calcium: 9.6 mg/dL (ref 8.7–10.2)
Creatinine, Ser: 0.84 mg/dL (ref 0.76–1.27)
GFR calc non Af Amer: 99 mL/min/{1.73_m2} (ref >59)
GFR, EST AFRICAN AMERICAN: 115 mL/min/{1.73_m2} (ref >59)
Globulin, Total: 3.6 g/dL (ref 1.5–4.5)
Glucose: 106 mg/dL — ABNORMAL HIGH (ref 65–99)
POTASSIUM: 4.7 mmol/L (ref 3.5–5.2)
Sodium: 138 mmol/L (ref 134–144)
TOTAL PROTEIN: 7.6 g/dL (ref 6.0–8.5)

## 2017-09-26 LAB — QUANTIFERON-TB GOLD PLUS
QuantiFERON Mitogen Value: >10 IU/mL
QuantiFERON Nil Value: 0.02 IU/mL
QuantiFERON TB1 Ag Value: 0.04 IU/mL
QuantiFERON TB2 Ag Value: 0.02 IU/mL
QuantiFERON-TB Gold Plus: NEGATIVE

## 2017-10-07 ENCOUNTER — Other Ambulatory Visit: Payer: Self-pay | Admitting: Rheumatology

## 2017-10-07 NOTE — Telephone Encounter (Signed)
Last visit: 07/30/17 Next visit: 01/01/18 Labs: 09/19/17 Glucose 106. All other lab values are WNL.  Okay to refill per Dr. Estanislado Pandy

## 2017-11-18 ENCOUNTER — Telehealth: Payer: Self-pay | Admitting: Rheumatology

## 2017-11-18 NOTE — Telephone Encounter (Signed)
Patient called requesting a new prescription for his Humira be sent to the [facility].

## 2017-11-19 ENCOUNTER — Other Ambulatory Visit: Payer: Self-pay | Admitting: *Deleted

## 2017-11-19 MED ORDER — ADALIMUMAB 40 MG/0.8ML ~~LOC~~ PSKT: 40.0000 mg | PREFILLED_SYRINGE | SUBCUTANEOUS | 0 refills | Status: DC

## 2017-11-19 NOTE — Telephone Encounter (Signed)
Last visit: 07/30/17 Next visit: 01/01/18 Labs: 09/19/17 Glucose 106. All other lab values are WNL. TB Gold: 09/19/17 Neg   Okay to refill per Dr. Estanislado Pandy  Faxed to [facility]

## 2017-11-21 NOTE — Telephone Encounter (Signed)
Received renewal application for Humira from [place]. Completed office portion and spoke with patient about required information. (Income info and signature). Patient has an appointment here on 01/01/18. He will bring info and sign application then.  2:47 PM Beatriz Chancellor, CPhT

## 2017-11-24 ENCOUNTER — Telehealth: Payer: Self-pay | Admitting: Rheumatology

## 2017-11-24 NOTE — Telephone Encounter (Signed)
Patient left a message stating Charles Jenkins does not have Rx for injections. Patient is due soon from next injection. Please send in rx.

## 2017-11-24 NOTE — Telephone Encounter (Signed)
Last visit: 07/30/17 Next visit: 01/01/18 Labs: 09/19/17 Glucose 106. All other lab values are WNL. TB Gold: 09/19/17 Neg   Okay to refill per Dr. Estanislado Pandy  Faxed prescription to [facility]

## 2017-11-25 ENCOUNTER — Telehealth: Payer: Self-pay | Admitting: Rheumatology

## 2017-11-25 NOTE — Telephone Encounter (Signed)
A verbal prescription was called to [facility] after prescription was faxed. Patient may come by the office to get a sample. Attempted to contact the patient and left message for patient to call the office.

## 2017-11-25 NOTE — Telephone Encounter (Signed)
Patient called stating he is due to take his Humira injection today and is still waiting for his prescription.  Patient states he called [facility] and they are waiting on prescription refill from Dr. Estanislado Pandy.

## 2017-11-25 NOTE — Telephone Encounter (Signed)
Spoke with patient he states [facility] did receive his prescription and the will get his medication out to him. He has enough medication to last him until his medication will be delivered.

## 2017-12-18 ENCOUNTER — Encounter: Payer: Self-pay | Admitting: Rheumatology

## 2017-12-18 NOTE — Progress Notes (Deleted)
Office Visit Note  Patient: Charles Jenkins             Date of Birth: 17-May-1960           MRN: [id number]             PCP: Glenda Chroman, MD Referring: Glenda Chroman, MD Visit Date: 01/01/2018 Occupation: @GUAROCC @  Subjective:  No chief complaint on file.   History of Present Illness: Charles Jenkins is a 57 y.o. male ***   Activities of Daily Living:  Patient reports morning stiffness for *** {minute/hour:19697}.   Patient {ACTIONS;DENIES/REPORTS:21021675::"Denies"} nocturnal pain.  Difficulty dressing/grooming: {ACTIONS;DENIES/REPORTS:21021675::"Denies"} Difficulty climbing stairs: {ACTIONS;DENIES/REPORTS:21021675::"Denies"} Difficulty getting out of chair: {ACTIONS;DENIES/REPORTS:21021675::"Denies"} Difficulty using hands for taps, buttons, cutlery, and/or writing: {ACTIONS;DENIES/REPORTS:21021675::"Denies"}  No Rheumatology ROS completed.   PMFS History:  Patient Active Problem List   Diagnosis Date Noted  . Other meniscus derangements, posterior horn of medial meniscus, left knee 01/08/2017  . Family history of scleroderma 09/25/2016  . Obesity due to excess calories 09/25/2016  . Primary osteoarthritis of both hands 04/10/2016  . Primary osteoarthritis of both feet 04/10/2016  . High risk medication use 04/09/2016  . 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 Lumbar spinal stenosis 05/05/2014  . 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 Insomnia 03/10/2013  . Spinal stenosis of lumbar region 10/28/2012  . Sciatica 09/23/2012  . Essential hypertension, benign 07/24/2012  . Sacroiliitis ([place]) 07/24/2012  . Depression 07/24/2012    Past Medical History:  Diagnosis Date  . Ankylosing spondylitis ([place])   . Arthritis   . Back pain   . Chronic right SI joint pain   . Hypertension    "only in doctor's offices"    Family History  Problem Relation Age of Onset  . Scleroderma Mother    Past Surgical  History:  Procedure Laterality Date  . KNEE ARTHROPLASTY Left 2018  . LUMBAR LAMINECTOMY/DECOMPRESSION MICRODISCECTOMY Left 10/28/2012   Procedure: MICRO LUMBAR DECOMPRESSION L4 - L5 and L5 - S1 2 LEVELS;  Surgeon: Johnn Hai, MD;  Location: [facility];  Service: Orthopedics;  Laterality: Left;  . LUMBAR LAMINECTOMY/DECOMPRESSION MICRODISCECTOMY N/A 05/04/2014   Procedure: MICRO LUMBAR DECOMPRESSION L2-L3, L3-L4;  Surgeon: Johnn Hai, MD;  Location: [facility];  Service: Orthopedics;  Laterality: N/A;   Social History   Social History Narrative  . Not on file    Objective: Vital Signs: There were no vitals taken for this visit.   Physical Exam   Musculoskeletal Exam: ***  CDAI Exam: CDAI Score: Not documented Patient Global Assessment: Not documented; Provider Global Assessment: Not documented Swollen: Not documented; Tender: Not documented Joint Exam   Not documented   There is currently no information documented on the homunculus. Go to the Rheumatology activity and complete the homunculus joint exam.  Investigation: No additional findings.  Imaging: No results found.  Recent Labs: Lab Results  Component Value Date   WBC 7.2 09/19/2017   HGB 14.9 09/19/2017   PLT 296 09/19/2017   NA 138 09/19/2017   K 4.7 09/19/2017   CL 100 09/19/2017   CO2 24 09/19/2017   GLUCOSE 106 (H) 09/19/2017   BUN 9 09/19/2017   CREATININE 0.84 09/19/2017   BILITOT 0.4 09/19/2017   ALKPHOS 81 09/19/2017   AST 19 09/19/2017   ALT 20 09/19/2017   PROT 7.6 09/19/2017   ALBUMIN 4.0 09/19/2017   CALCIUM 9.6 09/19/2017   GFRAA 115 09/19/2017  QFTBGOLDPLUS Negative 09/19/2017    Speciality Comments: No specialty comments available.  Procedures:  No procedures performed Allergies: Patient has no known allergies.   Assessment / Plan:     Visit Diagnoses: No diagnosis found.   Orders: No orders of the defined types were placed in this encounter.  No orders of the defined types  were placed in this encounter.   Face-to-face time spent with patient was *** minutes. Greater than 50% of time was spent in counseling and coordination of care.  Follow-Up Instructions: No follow-ups on file.   Earnestine Mealing, CMA  Note - This record has been created using Editor, commissioning.  Chart creation errors have been sought, but may not always  have been located. Such creation errors do not reflect on  the standard of medical care.

## 2017-12-24 ENCOUNTER — Ambulatory Visit (INDEPENDENT_AMBULATORY_CARE_PROVIDER_SITE_OTHER): Payer: Self-pay

## 2017-12-24 ENCOUNTER — Encounter (INDEPENDENT_AMBULATORY_CARE_PROVIDER_SITE_OTHER): Payer: Self-pay | Admitting: Orthopaedic Surgery

## 2017-12-24 ENCOUNTER — Ambulatory Visit (INDEPENDENT_AMBULATORY_CARE_PROVIDER_SITE_OTHER): Payer: Medicare Other | Admitting: Orthopaedic Surgery

## 2017-12-24 VITALS — BP 166/93 | HR 85 | Ht 72.0 in | Wt 352.0 lb

## 2017-12-24 DIAGNOSIS — M25512 Pain in left shoulder: Secondary | ICD-10-CM

## 2017-12-24 MED ORDER — LIDOCAINE HCL 1 % IJ SOLN
2.0000 mL | INTRAMUSCULAR | Status: AC | PRN
  Administered 2017-12-24: 2 mL

## 2017-12-24 MED ORDER — METHYLPREDNISOLONE ACETATE 40 MG/ML IJ SUSP
80.0000 mg | INTRAMUSCULAR | Status: AC | PRN
  Administered 2017-12-24: 80 mg

## 2017-12-24 MED ORDER — BUPIVACAINE HCL 0.5 % IJ SOLN
2.0000 mL | INTRAMUSCULAR | Status: AC | PRN
  Administered 2017-12-24: 2 mL via INTRA_ARTICULAR

## 2017-12-24 NOTE — Progress Notes (Signed)
Office Visit Note   Patient: Charles Jenkins           Date of Birth: 10/19/1960           MRN: [id number] Visit Date: 12/24/2017              Requested by: Glenda Chroman, MD [address], [place] [id number] PCP: Glenda Chroman, MD   Assessment & Plan: Visit Diagnoses:  1. Acute pain of left shoulder     Plan: Management syndrome left shoulder with x-rays consistent with early osteoarthritis.  We will try a subacromial and intra-articular cortisone injection and monitor response  Follow-Up Instructions: Return if symptoms worsen or fail to improve.   Orders:  Orders Placed This Encounter  Procedures  . Large Joint Inj: L glenohumeral  . XR Shoulder Left   No orders of the defined types were placed in this encounter.     Procedures: Large Joint Inj: L glenohumeral on 12/24/2017 10:27 AM Details: 25 G 1.5 in needle, posterior approach  Arthrogram: No  Medications: 2 mL lidocaine 1 %; 2 mL bupivacaine 0.5 %; 80 mg methylPREDNISolone acetate 40 MG/ML Procedure, treatment alternatives, risks and benefits explained, specific risks discussed. Consent was given by the patient. Immediately prior to procedure a time out was called to verify the correct patient, procedure, equipment, support staff and site/side marked as required. Patient was prepped and draped in the usual sterile fashion.       Clinical Data: No additional findings.   Subjective: Chief Complaint  Patient presents with  . Follow-up    L SHOULDER PAIN FOR 2 WEEKS RADIATED DOWN ARM TO ELBOW THOUGHT HE SLEPT ON IT WRONG, DR VEST GAVE HIM TRAMADOL BUT IT DOSENT HELP  To the acute onset of left shoulder pain 2 weeks ago without specific injury or trauma.  Has had some difficulty sleeping on that side since.  Also has had some difficulty raising his left arm over his head.  No prior problems.  Denies any problems with his cervical spine.  No numbness or tingling  HPI  Review of Systems  Constitutional:  Negative for fatigue and fever.  HENT: Negative for ear pain.   Eyes: Negative for pain.  Respiratory: Negative for cough and shortness of breath.   Cardiovascular: Negative for leg swelling.  Gastrointestinal: Negative for constipation and diarrhea.  Genitourinary: Negative for difficulty urinating.  Musculoskeletal: Positive for neck pain. Negative for back pain.  Skin: Negative for rash.  Allergic/Immunologic: Negative for food allergies.  Neurological: Positive for weakness. Negative for numbness.  Hematological: Does not bruise/bleed easily.  Psychiatric/Behavioral: Positive for sleep disturbance.     Objective: Vital Signs: BP (!) 166/93 (BP Location: Left Arm, Patient Position: Sitting, Cuff Size: Normal)   Pulse 85   Ht 6' (1.829 m)   Wt (!) 352 lb (159.7 kg)   BMI 47.74 kg/m   Physical Exam  Constitutional: He is oriented to person, place, and time. He appears well-developed and well-nourished.  HENT:  Mouth/Throat: Oropharynx is clear and moist.  Eyes: Pupils are equal, round, and reactive to light. EOM are normal.  Pulmonary/Chest: Effort normal.  Neurological: He is alert and oriented to person, place, and time.  Skin: Skin is warm and dry.  Psychiatric: He has a normal mood and affect. His behavior is normal.    Ortho Exam wake alert and oriented x3.  Comfortable sitting.  Positive impingement on the extremes of internal/external rotation left shoulder.  Able  to place the arm fully overhead.  Skin intact.  Biceps intact.  Good grip and good release.  No pain referred to the left shoulder with any motion of the cervical spine.  Spine motion not impeded mild pain along the anterior subacromial region.  Negative Speed sign  Specialty Comments:  No specialty comments available.  Imaging: Xr Shoulder Left  Result Date: 12/24/2017 Films of the left shoulder obtained in several projections.  There is a inferiorly directed humeral head spur consistent with arthritis.   The joint space is still well-maintained.  The humeral head is centered about the glenoid.  No ectopic calcification.  Degenerative changes noted at the acromioclavicular joint.  Normal space between the humeral head and the acromium.  No acute change    PMFS History: Patient Active Problem List   Diagnosis Date Noted  . Other meniscus derangements, posterior horn of medial meniscus, left knee 01/08/2017  . Family history of scleroderma 09/25/2016  . Obesity due to excess calories 09/25/2016  . Primary osteoarthritis of both hands 04/10/2016  . Primary osteoarthritis of both feet 04/10/2016  . High risk medication use 04/09/2016  . 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 Lumbar spinal stenosis 05/05/2014  . 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 Insomnia 03/10/2013  . Spinal stenosis of lumbar region 10/28/2012  . Sciatica 09/23/2012  . Essential hypertension, benign 07/24/2012  . Sacroiliitis ([place]) 07/24/2012  . Depression 07/24/2012   Past Medical History:  Diagnosis Date  . Ankylosing spondylitis ([place])   . Arthritis   . Back pain   . Chronic right SI joint pain   . Hypertension    "only in doctor's offices"    Family History  Problem Relation Age of Onset  . Scleroderma Mother     Past Surgical History:  Procedure Laterality Date  . KNEE ARTHROPLASTY Left 2018  . LUMBAR LAMINECTOMY/DECOMPRESSION MICRODISCECTOMY Left 10/28/2012   Procedure: MICRO LUMBAR DECOMPRESSION L4 - L5 and L5 - S1 2 LEVELS;  Surgeon: Johnn Hai, MD;  Location: [facility];  Service: Orthopedics;  Laterality: Left;  . LUMBAR LAMINECTOMY/DECOMPRESSION MICRODISCECTOMY N/A 05/04/2014   Procedure: MICRO LUMBAR DECOMPRESSION L2-L3, L3-L4;  Surgeon: Johnn Hai, MD;  Location: [facility];  Service: Orthopedics;  Laterality: N/A;   Social History   Occupational History  . Not on file  Tobacco Use  . Smoking status: Former Smoker    Types:  Cigars  . Smokeless tobacco: Current User    Types: Snuff  . Tobacco comment: smokes occasional cigar  Substance and Sexual Activity  . Alcohol use: No  . Drug use: No  . Sexual activity: Not on file

## 2017-12-28 ENCOUNTER — Other Ambulatory Visit: Payer: Self-pay | Admitting: Rheumatology

## 2017-12-30 NOTE — Telephone Encounter (Addendum)
Last visit: 07/30/17 Next visit: 01/01/18 Labs: 09/19/17 Glucose 106. All other lab values are WNL.  Left message to advise patient he is due for labs.   Okay to refill per 30 day supply per Dr. Estanislado Pandy

## 2017-12-31 NOTE — Telephone Encounter (Signed)
Spoke to patient and wife, they will bring income documents to be submitted with [facility] Assist renewal application to appointment tomorrow.  11:07 AM Beatriz Chancellor, CPhT

## 2018-01-01 ENCOUNTER — Ambulatory Visit: Payer: Medicare Other | Admitting: Rheumatology

## 2018-01-01 NOTE — Progress Notes (Deleted)
Office Visit Note  Patient: Charles Jenkins             Date of Birth: [birth year]           MRN: [id number]             PCP: Glenda Chroman, MD Referring: Glenda Chroman, MD Visit Date: 01/09/2018 Occupation: '@GUAROCC'$ @  Subjective:  No chief complaint on file.   History of Present Illness: Charles Jenkins is a 57 y.o. male ***   Activities of Daily Living:  Patient reports morning stiffness for *** {minute/hour:19697}.   Patient {ACTIONS;DENIES/REPORTS:21021675::"Denies"} nocturnal pain.  Difficulty dressing/grooming: {ACTIONS;DENIES/REPORTS:21021675::"Denies"} Difficulty climbing stairs: {ACTIONS;DENIES/REPORTS:21021675::"Denies"} Difficulty getting out of chair: {ACTIONS;DENIES/REPORTS:21021675::"Denies"} Difficulty using hands for taps, buttons, cutlery, and/or writing: {ACTIONS;DENIES/REPORTS:21021675::"Denies"}  No Rheumatology ROS completed.   PMFS History:  Patient Active Problem List   Diagnosis Date Noted  . Other meniscus derangements, posterior horn of medial meniscus, left knee 01/08/2017  . Family history of scleroderma 09/25/2016  . Obesity due to excess calories 09/25/2016  . Primary osteoarthritis of both hands 04/10/2016  . Primary osteoarthritis of both feet 04/10/2016  . High risk medication use 04/09/2016  . 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 Lumbar spinal stenosis 05/05/2014  . 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 Insomnia 03/10/2013  . Spinal stenosis of lumbar region 10/28/2012  . Sciatica 09/23/2012  . Essential hypertension, benign 07/24/2012  . Sacroiliitis ([place]) 07/24/2012  . Depression 07/24/2012    Past Medical History:  Diagnosis Date  . Ankylosing spondylitis ([place])   . Arthritis   . Back pain   . Chronic right SI joint pain   . Hypertension    "only in doctor's offices"    Family History  Problem Relation Age of Onset  . Scleroderma Mother    Past Surgical  History:  Procedure Laterality Date  . KNEE ARTHROPLASTY Left 2018  . LUMBAR LAMINECTOMY/DECOMPRESSION MICRODISCECTOMY Left 10/28/2012   Procedure: MICRO LUMBAR DECOMPRESSION L4 - L5 and L5 - S1 2 LEVELS;  Surgeon: Johnn Hai, MD;  Location: [facility];  Service: Orthopedics;  Laterality: Left;  . LUMBAR LAMINECTOMY/DECOMPRESSION MICRODISCECTOMY N/A 05/04/2014   Procedure: MICRO LUMBAR DECOMPRESSION L2-L3, L3-L4;  Surgeon: Johnn Hai, MD;  Location: [facility];  Service: Orthopedics;  Laterality: N/A;   Social History   Social History Narrative  . Not on file    Objective: Vital Signs: There were no vitals taken for this visit.   Physical Exam   Musculoskeletal Exam: ***  CDAI Exam: CDAI Score: Not documented Patient Global Assessment: Not documented; Provider Global Assessment: Not documented Swollen: Not documented; Tender: Not documented Joint Exam   Not documented   There is currently no information documented on the homunculus. Go to the Rheumatology activity and complete the homunculus joint exam.  Investigation: No additional findings.  Imaging: Xr Shoulder Left  Result Date: 12/24/2017 Films of the left shoulder obtained in several projections.  There is a inferiorly directed humeral head spur consistent with arthritis.  The joint space is still well-maintained.  The humeral head is centered about the glenoid.  No ectopic calcification.  Degenerative changes noted at the acromioclavicular joint.  Normal space between the humeral head and the acromium.  No acute change   Recent Labs: Lab Results  Component Value Date   WBC 7.2 09/19/2017   HGB 14.9 09/19/2017   PLT 296 09/19/2017   NA 138 09/19/2017   K 4.7  09/19/2017   CL 100 09/19/2017   CO2 24 09/19/2017   GLUCOSE 106 (H) 09/19/2017   BUN 9 09/19/2017   CREATININE 0.84 09/19/2017   BILITOT 0.4 09/19/2017   ALKPHOS 81 09/19/2017   AST 19 09/19/2017   ALT 20 09/19/2017   PROT 7.6 09/19/2017   ALBUMIN  4.0 09/19/2017   CALCIUM 9.6 09/19/2017   GFRAA 115 09/19/2017   QFTBGOLDPLUS Negative 09/19/2017    Speciality Comments: No specialty comments available.  Procedures:  No procedures performed Allergies: Patient has no known allergies.   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  July 2014 and January 2016 by Dr. Maxie Better.   History of colon polyps  History of hypertension  Family history of scleroderma  Trigger finger, right middle finger  Trigger index finger of right hand  Spinal stenosis of lumbar region without neurogenic claudication  Primary insomnia  Hypergammaglobulinemia  History of diabetes mellitus   Orders: No orders of the defined types were placed in this encounter.  No orders of the defined types were placed in this encounter.   Face-to-face time spent with patient was *** minutes. Greater than 50% of time was spent in counseling and coordination of care.  Follow-Up Instructions: No follow-ups on file.   Ofilia Neas, PA-C  Note - This record has been created using Dragon software.  Chart creation errors have been sought, but may not always  have been located. Such creation errors do not reflect on  the standard of medical care.

## 2018-01-09 ENCOUNTER — Ambulatory Visit: Payer: Medicare Other | Admitting: Physician Assistant

## 2018-01-13 NOTE — Progress Notes (Signed)
Office Visit Note  Patient: Charles Jenkins             Date of Birth: Jan 31, 1961           MRN: [id number]             PCP: Glenda Chroman, MD Referring: Glenda Chroman, MD Visit Date: 01/27/2018 Occupation: _0 @  Subjective:  Left index trigger finger   History of Present Illness: Charles Jenkins is a 57 y.o. male with history of spondyloarthropathy and osteoarthritis.  Patient is on Humira subcutaneous injections every other week and methotrexate 3 tablets by mouth once weekly.  He denies any recent flares.  He denies any SI joint pain or low back pain at this time.  He denies any joint pain or joint swelling at this time.  He denies any eye redness or inflammation.  He denies any GI symptoms at this time.  He has no Achilles tendinitis or plantar fasciitis.  He reports that he has a left trigger index finger and needs a cortisone injection but will be helping his wife with a ourselves to consider does not want to get it today.  He states that he followed up with Dr. Durward Fortes about 1 month ago for a left shoulder cortisone injection which resolved his left shoulder pain.  He denies any other concerns at this time.    Activities of Daily Living:  Patient reports morning stiffness for 3-5 minutes.   Patient Denies nocturnal pain.  Difficulty dressing/grooming: Denies Difficulty climbing stairs: Reports Difficulty getting out of chair: Reports Difficulty using hands for taps, buttons, cutlery, and/or writing: Reports  Review of Systems  Constitutional: Negative for fatigue and night sweats.  HENT: Negative for mouth sores, trouble swallowing, trouble swallowing, mouth dryness and nose dryness.   Eyes: Negative for pain, redness, visual disturbance and dryness.  Respiratory: Negative for cough, hemoptysis, shortness of breath, wheezing and difficulty breathing.   Cardiovascular: Negative for chest pain, palpitations, hypertension, irregular heartbeat and swelling in legs/feet.    Gastrointestinal: Negative for abdominal pain, constipation, diarrhea, nausea and vomiting.  Endocrine: Negative for increased urination.  Genitourinary: Negative for painful urination, pelvic pain and urgency.  Musculoskeletal: Positive for arthralgias, joint pain and morning stiffness. Negative for joint swelling, myalgias, muscle weakness, muscle tenderness and myalgias.  Skin: Negative for color change, rash, hair loss, nodules/bumps, skin tightness, ulcers and sensitivity to sunlight.  Allergic/Immunologic: Negative for susceptible to infections.  Neurological: Negative for dizziness, fainting, light-headedness, headaches, memory loss, night sweats and weakness.  Hematological: Negative for swollen glands.  Psychiatric/Behavioral: Negative for depressed mood, confusion and sleep disturbance. The patient is not nervous/anxious.     PMFS History:  Patient Active Problem List   Diagnosis Date Noted  . Other meniscus derangements, posterior horn of medial meniscus, left knee 01/08/2017  . Family history of scleroderma 09/25/2016  . Obesity due to excess calories 09/25/2016  . Primary osteoarthritis of both hands 04/10/2016  . Primary osteoarthritis of both feet 04/10/2016  . High risk medication use 04/09/2016  . 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 Lumbar spinal stenosis 05/05/2014  . 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 Insomnia 03/10/2013  . Spinal stenosis of lumbar region 10/28/2012  . Sciatica 09/23/2012  . Essential hypertension, benign 07/24/2012  . Sacroiliitis ([place]) 07/24/2012  . Depression 07/24/2012    Past Medical History:  Diagnosis Date  . Ankylosing spondylitis ([place])   . Arthritis   .  Back pain   . Chronic right SI joint pain   . Hypertension    "only in doctor's offices"    Family History  Problem Relation Age of Onset  . Scleroderma Mother    Past Surgical History:  Procedure Laterality  Date  . KNEE ARTHROPLASTY Left 2018  . LUMBAR LAMINECTOMY/DECOMPRESSION MICRODISCECTOMY Left 10/28/2012   Procedure: MICRO LUMBAR DECOMPRESSION L4 - L5 and L5 - S1 2 LEVELS;  Surgeon: Johnn Hai, MD;  Location: [facility];  Service: Orthopedics;  Laterality: Left;  . LUMBAR LAMINECTOMY/DECOMPRESSION MICRODISCECTOMY N/A 05/04/2014   Procedure: MICRO LUMBAR DECOMPRESSION L2-L3, L3-L4;  Surgeon: Johnn Hai, MD;  Location: [facility];  Service: Orthopedics;  Laterality: N/A;   Social History   Social History Narrative  . Not on file    Objective: Vital Signs: BP 130/80 (BP Location: Left Arm, Patient Position: Sitting, Cuff Size: Large)   Pulse 77   Resp 17   Ht 6' (1.829 m)   Wt (!) 360 lb (163.3 kg)   BMI 48.82 kg/m     Physical Exam  Constitutional: He is oriented to person, place, and time. He appears well-developed and well-nourished.  HENT:  Head: Normocephalic and atraumatic.  Eyes: Pupils are equal, round, and reactive to light. Conjunctivae and EOM are normal.  Neck: Normal range of motion. Neck supple.  Cardiovascular: Normal rate, regular rhythm and normal heart sounds.  Pulmonary/Chest: Effort normal and breath sounds normal.  Abdominal: Soft. Bowel sounds are normal.  Lymphadenopathy:    He has no cervical adenopathy.  Neurological: He is alert and oriented to person, place, and time.  Skin: Skin is warm and dry. Capillary refill takes less than 2 seconds.  Psychiatric: He has a normal mood and affect. His behavior is normal.  Nursing note and vitals reviewed.    Musculoskeletal Exam: C-spine, thoracic spine, lumbar spine good range of motion.  No midline spinal tenderness.  No SI joint tenderness.  Right shoulder full range of motion with no discomfort.  Left shoulder full range of motion with discomfort.  Elbow joints, wrist joints, MCPs, PIPs, DIPs good range of motion no synovitis.  He has complete fist formation bilaterally.  Hip joints, knee joints, ankle joints  good range of motion with no synovitis.  No Achilles tendinitis or plantar fasciitis.  CDAI Exam: CDAI Score: Not documented Patient Global Assessment: Not documented; Provider Global Assessment: Not documented Swollen: Not documented; Tender: Not documented Joint Exam   Not documented   There is currently no information documented on the homunculus. Go to the Rheumatology activity and complete the homunculus joint exam.  Investigation: No additional findings.  Imaging: No results found.  Recent Labs: Lab Results  Component Value Date   WBC 7.2 09/19/2017   HGB 14.9 09/19/2017   PLT 296 09/19/2017   NA 138 09/19/2017   K 4.7 09/19/2017   CL 100 09/19/2017   CO2 24 09/19/2017   GLUCOSE 106 (H) 09/19/2017   BUN 9 09/19/2017   CREATININE 0.84 09/19/2017   BILITOT 0.4 09/19/2017   ALKPHOS 81 09/19/2017   AST 19 09/19/2017   ALT 20 09/19/2017   PROT 7.6 09/19/2017   ALBUMIN 4.0 09/19/2017   CALCIUM 9.6 09/19/2017   GFRAA 115 09/19/2017   QFTBGOLDPLUS Negative 09/19/2017    Speciality Comments: No specialty comments available.  Procedures:  No procedures performed Allergies: Patient has no known allergies.   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  He has no SI joint tenderness or lower back pain at this time.  He has no joint pain or joint swelling.  He has no Achilles tendinitis or plantar fasciitis.  He has no eye redness or inflammation.  He has not had any recent new or worsening GI symptoms.  He is clinically doing well on Humira 40 mg subcutaneous every other week and methotrexate 3 tablets by mouth once weekly. He does not need any refills at this time.  He was advised to notify us if he develops any new or worsening symptoms.  He will follow-up in the office in 5 months.  High risk medication use -  Humira every other week, methotrexate 3  tablets weekly and folic acid 1 mg daily.  TB gold -09/19/2017.  CBC and CMP were drawn on 12/18/2017.  He will return in November and every 3 months for lab work to monitor for drug toxicity.  Sacroiliitis [facility]): He has no SI joint tenderness on exam.  Primary osteoarthritis of both hands: He has no synovitis on exam.  He has mild osteoarthritic changes in bilateral hands.  He has complete fist formation bilaterally.  Joint protection and muscle strengthening were discussed.  Primary osteoarthritis of both feet: He has osteoarthritic changes in bilateral feet.  He has no discomfort in his feet at this time.  He has no plantar fasciitis or Achilles tendinitis.  He was proper fitting shoes.  S/P arthroscopic surgery of left knee - 01/23/2017 by Dr. Durward Fortes for torn meniscus.  Doing well.  No warmth or effusion.  He has good range of motion with no discomfort  DDD (degenerative disc disease), lumbar: He has no midline spinal tenderness.  He has no discomfort in his lower back at this time.  Trigger finger, left index finger: He declined a cortisone injection today due to having to help his wife with regards to this week and.  He will schedule an ultrasound-guided trigger finger injection at his earliest convenience.  Other medical conditions are listed as follows:  History of colon polyps  History of hypertension  History of obesity  Family history of scleroderma  Primary insomnia  Hypergammaglobulinemia  History of diabetes mellitus  Essential hypertension, benign   Orders: No orders of the defined types were placed in this encounter.  No orders of the defined types were placed in this encounter.    Follow-Up Instructions: Return in about 5 months (around 06/28/2018) for Spondyloarthropathy, Osteoarthritis.   Ofilia Neas, PA-C  Note - This record has been created using Dragon software.  Chart creation errors have been sought, but may not always  have been located. Such  creation errors do not reflect on  the standard of medical care.

## 2018-01-19 ENCOUNTER — Other Ambulatory Visit: Payer: Self-pay | Admitting: Rheumatology

## 2018-01-19 NOTE — Telephone Encounter (Signed)
ok 

## 2018-01-19 NOTE — Telephone Encounter (Addendum)
Last visit: 07/30/17 Next visit: 01/27/18 Labs: 09/19/17 Glucose 106. All other lab values are WNL.  Attempted to contact the patient and left message to advise patient he is due for labs.   Okay to refill 30 day supply MTX?

## 2018-01-26 ENCOUNTER — Other Ambulatory Visit: Payer: Self-pay | Admitting: Rheumatology

## 2018-01-26 NOTE — Telephone Encounter (Signed)
Last visit: 07/30/17 Next visit: 01/27/18  Okay to refill per Dr. Estanislado Pandy

## 2018-01-27 ENCOUNTER — Encounter: Payer: Self-pay | Admitting: Physician Assistant

## 2018-01-27 ENCOUNTER — Ambulatory Visit: Payer: Medicare Other | Admitting: Physician Assistant

## 2018-01-27 VITALS — BP 130/80 | HR 77 | Resp 17 | Ht 72.0 in | Wt 360.0 lb

## 2018-01-27 DIAGNOSIS — M19041 Primary osteoarthritis, right hand: Secondary | ICD-10-CM

## 2018-01-27 DIAGNOSIS — M19072 Primary osteoarthritis, left ankle and foot: Secondary | ICD-10-CM

## 2018-01-27 DIAGNOSIS — M5136 Other intervertebral disc degeneration, lumbar region: Secondary | ICD-10-CM

## 2018-01-27 DIAGNOSIS — M19042 Primary osteoarthritis, left hand: Secondary | ICD-10-CM

## 2018-01-27 DIAGNOSIS — M47819 Spondylosis without myelopathy or radiculopathy, site unspecified: Secondary | ICD-10-CM

## 2018-01-27 DIAGNOSIS — M19071 Primary osteoarthritis, right ankle and foot: Secondary | ICD-10-CM

## 2018-01-27 DIAGNOSIS — I10 Essential (primary) hypertension: Secondary | ICD-10-CM

## 2018-01-27 DIAGNOSIS — M461 Sacroiliitis, not elsewhere classified: Secondary | ICD-10-CM | POA: Diagnosis not present

## 2018-01-27 DIAGNOSIS — M65322 Trigger finger, left index finger: Secondary | ICD-10-CM

## 2018-01-27 DIAGNOSIS — Z8269 Family history of other diseases of the musculoskeletal system and connective tissue: Secondary | ICD-10-CM

## 2018-01-27 DIAGNOSIS — Z8639 Personal history of other endocrine, nutritional and metabolic disease: Secondary | ICD-10-CM

## 2018-01-27 DIAGNOSIS — Z86010 Personal history of colonic polyps: Secondary | ICD-10-CM

## 2018-01-27 DIAGNOSIS — Z9889 Other specified postprocedural states: Secondary | ICD-10-CM

## 2018-01-27 DIAGNOSIS — Z79899 Other long term (current) drug therapy: Secondary | ICD-10-CM

## 2018-01-27 DIAGNOSIS — F5101 Primary insomnia: Secondary | ICD-10-CM

## 2018-01-27 DIAGNOSIS — Z8679 Personal history of other diseases of the circulatory system: Secondary | ICD-10-CM

## 2018-01-27 DIAGNOSIS — D892 Hypergammaglobulinemia, unspecified: Secondary | ICD-10-CM

## 2018-01-27 NOTE — Telephone Encounter (Signed)
Submitted renewal application for Humira from [employer]. Will follow up to confirm receipt.   Will send documents to scan center.  Fax# [phone] Phone# [phone]  2:12 PM Mccauley Diehl Delice Lesch, CPhT

## 2018-01-27 NOTE — Patient Instructions (Signed)
Standing Labs We placed an order today for your standing lab work.    Please come back and get your standing labs in November and every 3 months  We have open lab Monday through Friday from 8:30-11:30 AM and 1:30-4:00 PM  at the office of Dr. Shaili Deveshwar.   You may experience shorter wait times on Monday and Friday afternoons. The office is located at [address], [address], [place], [place] [address] No appointment is necessary.   Labs are drawn by [facility].  You may receive a bill from [facility] for your lab work. If you have any questions regarding directions or hours of operation,  please call [phone].   Just as a reminder please drink plenty of water prior to coming for your lab work. Thanks!  

## 2018-02-03 NOTE — Telephone Encounter (Signed)
Received approval for Patient Assistance from [facility] for Humira. Patient has been approved for assistance through 04/29/2019.  Will send document to scan center.  Phone#[phone]  3:32 PM Beatriz Chancellor, CPhT

## 2018-02-05 ENCOUNTER — Other Ambulatory Visit: Payer: Self-pay | Admitting: *Deleted

## 2018-02-05 MED ORDER — ADALIMUMAB 40 MG/0.8ML ~~LOC~~ PSKT: 40.0000 mg | PREFILLED_SYRINGE | SUBCUTANEOUS | 0 refills | Status: DC

## 2018-02-05 NOTE — Telephone Encounter (Signed)
Refill request received from [facility].   Last Visit: 01/27/18 Next Visit: 07/01/17 Labs: 12/18/17 WNL TB Gold: 09/19/17 Neg

## 2018-02-13 ENCOUNTER — Other Ambulatory Visit: Payer: Self-pay | Admitting: Physician Assistant

## 2018-02-13 NOTE — Telephone Encounter (Signed)
Last Visit: 01/27/18 Next Visit: 07/01/17 Labs: 12/18/17 WNL  Okay to refill per Dr. Estanislado Pandy

## 2018-03-11 ENCOUNTER — Ambulatory Visit (INDEPENDENT_AMBULATORY_CARE_PROVIDER_SITE_OTHER): Payer: Medicare Other | Admitting: Rheumatology

## 2018-03-11 DIAGNOSIS — M65322 Trigger finger, left index finger: Secondary | ICD-10-CM | POA: Diagnosis not present

## 2018-03-11 DIAGNOSIS — Z79899 Other long term (current) drug therapy: Secondary | ICD-10-CM

## 2018-03-11 MED ORDER — LIDOCAINE HCL 1 % IJ SOLN
0.5000 mL | INTRAMUSCULAR | Status: AC | PRN
  Administered 2018-03-11: .5 mL

## 2018-03-11 MED ORDER — TRIAMCINOLONE ACETONIDE 40 MG/ML IJ SUSP
10.0000 mg | INTRAMUSCULAR | Status: AC | PRN
  Administered 2018-03-11: 10 mg

## 2018-03-11 NOTE — Progress Notes (Signed)
   Procedure Note  Patient: Charles Jenkins             Date of Birth: 1960/07/27           MRN: [id number]             Visit Date: 03/11/2018  Procedures: Visit Diagnoses: Trigger index finger of left hand  High risk medication use - Humira every week and Methotrexate 3 tablets per week, folic acid 1 mg daily, - Plan: COMPLETE METABOLIC PANEL WITH GFR, CBC with Differential/Platelet  Hand/UE Inj: L index A1 for trigger finger on 03/11/2018 3:28 PM Indications: pain, tendon swelling and therapeutic Details: 27 G needle, ultrasound-guided volar approach Medications: 0.5 mL lidocaine 1 %; 10 mg triamcinolone acetonide 40 MG/ML Aspirate: 0 mL Consent was given by the patient. Immediately prior to procedure a time out was called to verify the correct patient, procedure, equipment, support staff and site/side marked as required. Patient was prepped and draped in the usual sterile fashion.    Patient tolerated the procedure well.  His digit was splinted after the procedure.  Postprocedure precautions were discussed. Bo Merino, MD

## 2018-03-12 LAB — COMPLETE METABOLIC PANEL WITH GFR
AG RATIO: 1.1 (calc) (ref 1.0–2.5)
ALKALINE PHOSPHATASE (APISO): 73 U/L (ref 40–115)
ALT: 27 U/L (ref 9–46)
AST: 26 U/L (ref 10–35)
Albumin: 4 g/dL (ref 3.6–5.1)
BILIRUBIN TOTAL: 0.5 mg/dL (ref 0.2–1.2)
BUN: 11 mg/dL (ref 7–25)
CHLORIDE: 103 mmol/L (ref 98–110)
CO2: 30 mmol/L (ref 20–32)
Calcium: 9.7 mg/dL (ref 8.6–10.3)
Creat: 0.88 mg/dL (ref 0.70–1.33)
GFR, Est African American: 112 mL/min/{1.73_m2} (ref >=60)
GFR, Est Non African American: 97 mL/min/{1.73_m2} (ref >=60)
Globulin: 3.6 g/dL (calc) (ref 1.9–3.7)
Glucose, Bld: 92 mg/dL (ref 65–99)
POTASSIUM: 4.5 mmol/L (ref 3.5–5.3)
Sodium: 140 mmol/L (ref 135–146)
Total Protein: 7.6 g/dL (ref 6.1–8.1)

## 2018-03-12 LAB — CBC WITH DIFFERENTIAL/PLATELET
BASOS ABS: 100 {cells}/uL (ref 0–200)
Basophils Relative: 1.3 %
Eosinophils Absolute: 377 cells/uL (ref 15–500)
Eosinophils Relative: 4.9 %
HCT: 44.6 % (ref 38.5–50.0)
HEMOGLOBIN: 15.3 g/dL (ref 13.2–17.1)
Lymphs Abs: 2048 cells/uL (ref 850–3900)
MCH: 30.5 pg (ref 27.0–33.0)
MCHC: 34.3 g/dL (ref 32.0–36.0)
MCV: 88.8 fL (ref 80.0–100.0)
MONOS PCT: 11.4 %
MPV: 11.8 fL (ref 7.5–12.5)
NEUTROS ABS: 4297 {cells}/uL (ref 1500–7800)
NEUTROS PCT: 55.8 %
Platelets: 292 10*3/uL (ref 140–400)
RBC: 5.02 10*6/uL (ref 4.20–5.80)
RDW: 13 % (ref 11.0–15.0)
Total Lymphocyte: 26.6 %
WBC mixed population: 878 cells/uL (ref 200–950)
WBC: 7.7 10*3/uL (ref 3.8–10.8)

## 2018-05-13 ENCOUNTER — Other Ambulatory Visit: Payer: Self-pay | Admitting: Rheumatology

## 2018-05-13 ENCOUNTER — Telehealth: Payer: Self-pay | Admitting: Rheumatology

## 2018-05-13 MED ORDER — ADALIMUMAB 40 MG/0.4ML ~~LOC~~ AJKT: 40.0000 mg | AUTO-INJECTOR | SUBCUTANEOUS | 0 refills | Status: DC

## 2018-05-13 NOTE — Telephone Encounter (Signed)
Patient needs a refill on Humara and MTX sent to [facility].

## 2018-05-13 NOTE — Telephone Encounter (Signed)
Last Visit: 01/27/18 Next Visit: 07/01/17 Labs: 03/11/18 WNL TB Gold: 09/19/17 Neg   Okay to refill per Dr. Estanislado Pandy MTX has already been sent to the pharmacy.  Prescription for Humira faxed to [facility]

## 2018-05-13 NOTE — Telephone Encounter (Signed)
Last Visit: 01/27/18 Next Visit: 07/01/17 Labs: 03/11/18 WNL  Okay to refill per Dr. Estanislado Pandy

## 2018-06-18 NOTE — Progress Notes (Signed)
Office Visit Note  Patient: Charles Jenkins             Date of Birth: [birth year]           MRN: [id number]             PCP: Glenda Chroman, MD Referring: Glenda Chroman, MD Visit Date: 07/02/2018 Occupation: @GUAROCC @  Subjective:  Left foot pain.    History of Present Illness: PADRAIG NHAN is a 58 y.o. male history of a spondyloarthropathy.  He states for the last 2 months he has been having pain and discomfort over the lateral aspect of his left foot.  Been having difficulty walking.  He denies any joint swelling.  He has some discomfort in his elbow joints off and on.  Her back pain is tolerable currently.  Activities of Daily Living:  Patient reports morning stiffness for 15 minutes.   Patient Denies nocturnal pain.  Difficulty dressing/grooming: Denies Difficulty climbing stairs: Reports Difficulty getting out of chair: Denies Difficulty using hands for taps, buttons, cutlery, and/or writing: Denies  Review of Systems  Constitutional: Negative for fatigue and night sweats.  HENT: Negative for mouth sores, mouth dryness and nose dryness.   Eyes: Negative for redness and dryness.  Respiratory: Negative for shortness of breath and difficulty breathing.   Cardiovascular: Negative for chest pain, palpitations, hypertension, irregular heartbeat and swelling in legs/feet.  Gastrointestinal: Negative for constipation and diarrhea.  Endocrine: Negative for increased urination.  Musculoskeletal: Positive for arthralgias, joint pain and morning stiffness. Negative for joint swelling, myalgias, muscle weakness, muscle tenderness and myalgias.  Skin: Negative for color change, rash, hair loss, nodules/bumps, skin tightness, ulcers and sensitivity to sunlight.  Allergic/Immunologic: Negative for susceptible to infections.  Neurological: Negative for dizziness, fainting, memory loss, night sweats and weakness ( ).  Hematological: Negative for swollen glands.  Psychiatric/Behavioral:  Negative for depressed mood and sleep disturbance. The patient is not nervous/anxious.     PMFS History:  Patient Active Problem List   Diagnosis Date Noted  . Other meniscus derangements, posterior horn of medial meniscus, left knee 01/08/2017  . Family history of scleroderma 09/25/2016  . Obesity due to excess calories 09/25/2016  . Primary osteoarthritis of both hands 04/10/2016  . Primary osteoarthritis of both feet 04/10/2016  . High risk medication use 04/09/2016  . 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 Lumbar spinal stenosis 05/05/2014  . 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 Insomnia 03/10/2013  . Spinal stenosis of lumbar region 10/28/2012  . Sciatica 09/23/2012  . Essential hypertension, benign 07/24/2012  . Sacroiliitis ([place]) 07/24/2012  . Depression 07/24/2012    Past Medical History:  Diagnosis Date  . Ankylosing spondylitis ([place])   . Arthritis   . Back pain   . Chronic right SI joint pain   . Hypertension    "only in doctor's offices"    Family History  Problem Relation Age of Onset  . Scleroderma Mother    Past Surgical History:  Procedure Laterality Date  . KNEE ARTHROPLASTY Left 2018  . LUMBAR LAMINECTOMY/DECOMPRESSION MICRODISCECTOMY Left 10/28/2012   Procedure: MICRO LUMBAR DECOMPRESSION L4 - L5 and L5 - S1 2 LEVELS;  Surgeon: Johnn Hai, MD;  Location: [facility];  Service: Orthopedics;  Laterality: Left;  . LUMBAR LAMINECTOMY/DECOMPRESSION MICRODISCECTOMY N/A 05/04/2014   Procedure: MICRO LUMBAR DECOMPRESSION L2-L3, L3-L4;  Surgeon: Johnn Hai, MD;  Location: [facility];  Service: Orthopedics;  Laterality: N/A;  Social History   Social History Narrative  . Not on file   Immunization History  Administered Date(s) Administered  . Influenza,inj,quad, With Preservative 01/30/2017, 02/10/2018  . Influenza-Unspecified 02/27/2012, 01/27/2013, 02/08/2014  . Pneumococcal-Unspecified 02/27/2012      Objective: Vital Signs: BP (!) 154/82 (BP Location: Left Arm, Patient Position: Sitting, Cuff Size: Large)   Pulse 79   Resp 12   Ht 6' (1.829 m)   Wt (!) 358 lb 9.6 oz (162.7 kg)   BMI 48.63 kg/m    Physical Exam Vitals signs and nursing note reviewed.  Constitutional:      Appearance: He is well-developed.  HENT:     Head: Normocephalic and atraumatic.  Eyes:     Conjunctiva/sclera: Conjunctivae normal.     Pupils: Pupils are equal, round, and reactive to light.  Neck:     Musculoskeletal: Normal range of motion and neck supple.  Cardiovascular:     Rate and Rhythm: Normal rate and regular rhythm.     Heart sounds: Normal heart sounds.  Pulmonary:     Effort: Pulmonary effort is normal.     Breath sounds: Normal breath sounds.  Abdominal:     General: Bowel sounds are normal.     Palpations: Abdomen is soft.  Skin:    General: Skin is warm and dry.     Capillary Refill: Capillary refill takes less than 2 seconds.  Neurological:     Mental Status: He is alert and oriented to person, place, and time.  Psychiatric:        Behavior: Behavior normal.      Musculoskeletal Exam: C-spine thoracic and lumbar spine good range of motion.  He has some discomfort range of motion of her lumbar spine.  Shoulder joints elbow joints wrist joint MCPs been good range of motion.  He has some DIP and PIP thickening in his hands and feet.  Hip joints, knee joints were in good range of motion.  He had tenderness over the base of his fifth metatarsal.  CDAI Exam: CDAI Score: Not documented Patient Global Assessment: Not documented; Provider Global Assessment: Not documented Swollen: Not documented; Tender: Not documented Joint Exam   Not documented   There is currently no information documented on the homunculus. Go to the Rheumatology activity and complete the homunculus joint exam.  Investigation: No additional findings.  Imaging: Xr Foot Complete Left  Result Date:  07/02/2018 First MTP, all PIP and DIP joint space narrowing was noted.  No erosive changes were noted.  Calcification was noted over the head of the fifth metatarsal.  No fracture was noted. Impression: These findings are consistent with osteoarthritis of the foot.   Recent Labs: Lab Results  Component Value Date   WBC 7.7 03/11/2018   HGB 15.3 03/11/2018   PLT 292 03/11/2018   NA 140 03/11/2018   K 4.5 03/11/2018   CL 103 03/11/2018   CO2 30 03/11/2018   GLUCOSE 92 03/11/2018   BUN 11 03/11/2018   CREATININE 0.88 03/11/2018   BILITOT 0.5 03/11/2018   ALKPHOS 81 09/19/2017   AST 26 03/11/2018   ALT 27 03/11/2018   PROT 7.6 03/11/2018   ALBUMIN 4.0 09/19/2017   CALCIUM 9.7 03/11/2018   GFRAA 112 03/11/2018   QFTBGOLDPLUS Negative 09/19/2017    Speciality Comments: No specialty comments available.  Procedures:  No procedures performed Allergies: Patient has no known allergies.   Assessment / Plan:     Visit Diagnoses: Spondyloarthropathy-he is clinically doing well on  Humira and methotrexate combination.  He states he starts having increased his discomfort in his joints prior to his next injection.  No synovitis was noted on examination today.  High risk medication use -  Humira every other week, methotrexate 3 tablets weekly and folic acid 1 mg daily.  TB gold -09/19/2017 - Plan: CBC with Differential/Platelet, COMPLETE METABOLIC PANEL WITH GFR, QuantiFERON-TB Gold Plus  Pain in left foot -he has been experiencing pain on the lateral aspect of his foot.  He is was tender over the base of his fifth metatarsal.  Plan: XR Foot Complete Left.  The x-ray was consistent with osteoarthritis.  Some spurring was noted over the fifth metatarsal.  I offered cortisone injection which he declined.  Have given him a prescription for Voltaren gel which can be used topically.  Proper fitting shoes were also emphasized.  Sacroiliitis ([facility])-he had no SI joint tenderness on examination  today  Primary osteoarthritis of both hands-DIP and PIP thickening was noted due to osteoarthritis.  Primary osteoarthritis of both feet-he has been using proper fitting shoes which is been helpful.  S/P arthroscopic surgery of left knee  DDD (degenerative disc disease), lumbar-he currently does not have much discomfort in his lower back.  History of colon polyps  History of hypertension  Family history of scleroderma  Primary insomnia  BMI 48.63-weight loss diet and exercise was emphasized.  Orders: Orders Placed This Encounter  Procedures  . XR Foot Complete Left  . CBC with Differential/Platelet  . COMPLETE METABOLIC PANEL WITH GFR  . QuantiFERON-TB Gold Plus   Meds ordered this encounter  Medications  . diclofenac sodium (VOLTAREN) 1 % GEL    Sig: 3 grams to 3 large joints up to 3 times daily    Dispense:  3 Tube    Refill:  3    Face-to-face time spent with patient was 30 minutes. Greater than 50% of time was spent in counseling and coordination of care.  Follow-Up Instructions: Return in about 5 months (around 12/02/2018) for Spondyloarthropathy.   Bo Merino, MD  Note - This record has been created using Editor, commissioning.  Chart creation errors have been sought, but may not always  have been located. Such creation errors do not reflect on  the standard of medical care.

## 2018-07-02 ENCOUNTER — Encounter: Payer: Self-pay | Admitting: Physician Assistant

## 2018-07-02 ENCOUNTER — Ambulatory Visit: Payer: Medicare Other | Admitting: Rheumatology

## 2018-07-02 ENCOUNTER — Ambulatory Visit (INDEPENDENT_AMBULATORY_CARE_PROVIDER_SITE_OTHER): Payer: Self-pay

## 2018-07-02 VITALS — BP 154/82 | HR 79 | Resp 12 | Ht 72.0 in | Wt 358.6 lb

## 2018-07-02 DIAGNOSIS — Z8679 Personal history of other diseases of the circulatory system: Secondary | ICD-10-CM

## 2018-07-02 DIAGNOSIS — M19072 Primary osteoarthritis, left ankle and foot: Secondary | ICD-10-CM

## 2018-07-02 DIAGNOSIS — M79672 Pain in left foot: Secondary | ICD-10-CM | POA: Diagnosis not present

## 2018-07-02 DIAGNOSIS — M5136 Other intervertebral disc degeneration, lumbar region: Secondary | ICD-10-CM

## 2018-07-02 DIAGNOSIS — Z9889 Other specified postprocedural states: Secondary | ICD-10-CM

## 2018-07-02 DIAGNOSIS — Z79899 Other long term (current) drug therapy: Secondary | ICD-10-CM | POA: Diagnosis not present

## 2018-07-02 DIAGNOSIS — Z86010 Personal history of colonic polyps: Secondary | ICD-10-CM

## 2018-07-02 DIAGNOSIS — M461 Sacroiliitis, not elsewhere classified: Secondary | ICD-10-CM | POA: Diagnosis not present

## 2018-07-02 DIAGNOSIS — M47819 Spondylosis without myelopathy or radiculopathy, site unspecified: Secondary | ICD-10-CM

## 2018-07-02 DIAGNOSIS — M19071 Primary osteoarthritis, right ankle and foot: Secondary | ICD-10-CM

## 2018-07-02 DIAGNOSIS — M19041 Primary osteoarthritis, right hand: Secondary | ICD-10-CM

## 2018-07-02 DIAGNOSIS — M19042 Primary osteoarthritis, left hand: Secondary | ICD-10-CM

## 2018-07-02 DIAGNOSIS — Z6841 Body Mass Index (BMI) 40.0 and over, adult: Secondary | ICD-10-CM

## 2018-07-02 DIAGNOSIS — F5101 Primary insomnia: Secondary | ICD-10-CM

## 2018-07-02 DIAGNOSIS — Z8269 Family history of other diseases of the musculoskeletal system and connective tissue: Secondary | ICD-10-CM

## 2018-07-02 MED ORDER — DICLOFENAC SODIUM 1 % TD GEL: TRANSDERMAL | 3 refills | Status: DC

## 2018-07-02 NOTE — Patient Instructions (Signed)
Standing Labs We placed an order today for your standing lab work.    Please come back and get your standing labs in June and every 3 months   We have open lab Monday through Friday from 8:30-11:30 AM and 1:30-4:00 PM  at the office of Dr. Zamauri Nez.   You may experience shorter wait times on Monday and Friday afternoons. The office is located at [address], [address], [place], [place] [address] No appointment is necessary.   Labs are drawn by [facility].  You may receive a bill from [facility] for your lab work.  If you wish to have your labs drawn at another location, please call the office 24 hours in advance to send orders.  If you have any questions regarding directions or hours of operation,  please call [phone].   Just as a reminder please drink plenty of water prior to coming for your lab work. Thanks!   

## 2018-07-03 NOTE — Progress Notes (Signed)
CBC, CMP normal

## 2018-07-04 LAB — QUANTIFERON-TB GOLD PLUS
Mitogen-NIL: >10 [IU]/mL
NIL: 0.01 [IU]/mL
QuantiFERON-TB Gold Plus: NEGATIVE
TB1-NIL: 0 [IU]/mL
TB2-NIL: 0 [IU]/mL

## 2018-07-04 LAB — COMPLETE METABOLIC PANEL WITHOUT GFR
AG Ratio: 1.1 (calc) (ref 1.0–2.5)
ALT: 20 U/L (ref 9–46)
AST: 20 U/L (ref 10–35)
Albumin: 3.9 g/dL (ref 3.6–5.1)
Alkaline phosphatase (APISO): 71 U/L (ref 35–144)
BUN: 16 mg/dL (ref 7–25)
CO2: 27 mmol/L (ref 20–32)
Calcium: 9.5 mg/dL (ref 8.6–10.3)
Chloride: 103 mmol/L (ref 98–110)
Creat: 0.88 mg/dL (ref 0.70–1.33)
GFR, Est African American: 112 mL/min/1.73m2
GFR, Est Non African American: 97 mL/min/1.73m2
Globulin: 3.7 g/dL (ref 1.9–3.7)
Glucose, Bld: 97 mg/dL (ref 65–99)
Potassium: 4.5 mmol/L (ref 3.5–5.3)
Sodium: 139 mmol/L (ref 135–146)
Total Bilirubin: 0.6 mg/dL (ref 0.2–1.2)
Total Protein: 7.6 g/dL (ref 6.1–8.1)

## 2018-07-04 LAB — CBC WITH DIFFERENTIAL/PLATELET
Absolute Monocytes: 855 {cells}/uL (ref 200–950)
Basophils Absolute: 83 {cells}/uL (ref 0–200)
Basophils Relative: 1 %
Eosinophils Absolute: 340 {cells}/uL (ref 15–500)
Eosinophils Relative: 4.1 %
HCT: 44.7 % (ref 38.5–50.0)
Hemoglobin: 15 g/dL (ref 13.2–17.1)
Lymphs Abs: 1992 {cells}/uL (ref 850–3900)
MCH: 29.5 pg (ref 27.0–33.0)
MCHC: 33.6 g/dL (ref 32.0–36.0)
MCV: 88 fL (ref 80.0–100.0)
MPV: 11.5 fL (ref 7.5–12.5)
Monocytes Relative: 10.3 %
Neutro Abs: 5030 {cells}/uL (ref 1500–7800)
Neutrophils Relative %: 60.6 %
Platelets: 327 Thousand/uL (ref 140–400)
RBC: 5.08 Million/uL (ref 4.20–5.80)
RDW: 12.7 % (ref 11.0–15.0)
Total Lymphocyte: 24 %
WBC: 8.3 Thousand/uL (ref 3.8–10.8)

## 2018-07-16 ENCOUNTER — Telehealth: Payer: Self-pay | Admitting: Rheumatology

## 2018-07-16 NOTE — Telephone Encounter (Signed)
Patient advised he should hold both medications while on antibiotics. Patient states he was advised to hold the Humira until he can see the dentist on 09/07/18. Patient would like to know if he should hold his MTX as well. Patient states he may have to surgical procedure at the time of his appointment.

## 2018-07-16 NOTE — Telephone Encounter (Signed)
Patient called stating he has an infected tooth and the dentist prescribed a 7 day supply of antibiotics and told him to discontinue taking Humira until his appointment on 09/07/18.  Patient states he is not sure if he should continue taking his Methotrexate (which he is due to take tomorrow) since he is not taking Humira..  Patient is requesting a return call.

## 2018-07-16 NOTE — Telephone Encounter (Signed)
He should hold methotrexate 1 week prior to the surgery and then restart 1 week after the surgery there is no infection.

## 2018-07-16 NOTE — Telephone Encounter (Signed)
Patient advised she should hold methotrexate 1 week prior to the surgery and then restart 1 week after the surgery there is no infection. Patient verbalized understanding.

## 2018-08-03 ENCOUNTER — Other Ambulatory Visit: Payer: Self-pay | Admitting: Rheumatology

## 2018-08-04 NOTE — Telephone Encounter (Signed)
Last Visit: 07/02/18 Next visit: 12/02/18 Labs: 07/02/18 WNL  Okay to refill per Dr. Estanislado Pandy

## 2018-08-05 ENCOUNTER — Other Ambulatory Visit: Payer: Self-pay | Admitting: *Deleted

## 2018-08-05 MED ORDER — ADALIMUMAB 40 MG/0.4ML ~~LOC~~ AJKT: 40.0000 mg | AUTO-INJECTOR | SUBCUTANEOUS | 0 refills | Status: DC

## 2018-08-05 NOTE — Telephone Encounter (Signed)
Refill request received from [facility]  Last Visit: 07/02/18 Next visit: 12/02/18 Labs: 07/02/18 WNL TB Gold: 07/02/18 Neg   Okay to refill per Dr. Estanislado Pandy

## 2018-10-01 ENCOUNTER — Telehealth: Payer: Self-pay | Admitting: Rheumatology

## 2018-10-01 DIAGNOSIS — Z79899 Other long term (current) drug therapy: Secondary | ICD-10-CM

## 2018-10-01 NOTE — Telephone Encounter (Signed)
Patient going to [facility] in [place] on Monday. Please release orders.

## 2018-10-01 NOTE — Telephone Encounter (Signed)
Lab Orders released.  

## 2018-10-06 LAB — CBC WITH DIFFERENTIAL/PLATELET
Basophils Absolute: 0.1 10*3/uL (ref 0.0–0.2)
Basos: 1 %
EOS (ABSOLUTE): 0.4 10*3/uL (ref 0.0–0.4)
Eos: 6 %
Hematocrit: 43.3 % (ref 37.5–51.0)
Hemoglobin: 14.3 g/dL (ref 13.0–17.7)
Immature Grans (Abs): 0 10*3/uL (ref 0.0–0.1)
Immature Granulocytes: 0 %
Lymphocytes Absolute: 2 10*3/uL (ref 0.7–3.1)
Lymphs: 29 %
MCH: 30.8 pg (ref 26.6–33.0)
MCHC: 33 g/dL (ref 31.5–35.7)
MCV: 93 fL (ref 79–97)
Monocytes Absolute: 0.9 10*3/uL (ref 0.1–0.9)
Monocytes: 14 %
Neutrophils Absolute: 3.5 10*3/uL (ref 1.4–7.0)
Neutrophils: 50 %
Platelets: 263 10*3/uL (ref 150–450)
RBC: 4.65 x10E6/uL (ref 4.14–5.80)
RDW: 13.2 % (ref 11.6–15.4)
WBC: 6.9 10*3/uL (ref 3.4–10.8)

## 2018-10-06 LAB — CMP14+EGFR
ALT: 27 IU/L (ref 0–44)
AST: 30 IU/L (ref 0–40)
Albumin/Globulin Ratio: 1.4 (ref 1.2–2.2)
Albumin: 4.1 g/dL (ref 3.8–4.9)
Alkaline Phosphatase: 83 IU/L (ref 39–117)
BUN/Creatinine Ratio: 14 (ref 9–20)
BUN: 12 mg/dL (ref 6–24)
Bilirubin Total: 0.4 mg/dL (ref 0.0–1.2)
CO2: 24 mmol/L (ref 20–29)
Calcium: 9.8 mg/dL (ref 8.7–10.2)
Chloride: 101 mmol/L (ref 96–106)
Creatinine, Ser: 0.87 mg/dL (ref 0.76–1.27)
GFR calc Af Amer: 112 mL/min/{1.73_m2} (ref >59)
GFR calc non Af Amer: 97 mL/min/{1.73_m2} (ref >59)
Globulin, Total: 2.9 g/dL (ref 1.5–4.5)
Glucose: 88 mg/dL (ref 65–99)
Potassium: 4.2 mmol/L (ref 3.5–5.2)
Sodium: 139 mmol/L (ref 134–144)
Total Protein: 7 g/dL (ref 6.0–8.5)

## 2018-11-03 ENCOUNTER — Other Ambulatory Visit: Payer: Self-pay | Admitting: Rheumatology

## 2018-11-03 NOTE — Telephone Encounter (Signed)
Last Visit: 07/02/18 Next visit: 12/02/18 Labs: 10/05/18 WNL  Okay to refill per Dr. Estanislado Pandy

## 2018-11-04 ENCOUNTER — Other Ambulatory Visit: Payer: Self-pay | Admitting: *Deleted

## 2018-11-04 MED ORDER — HUMIRA (2 PEN) 40 MG/0.4ML ~~LOC~~ AJKT: 40.0000 mg | AUTO-INJECTOR | SUBCUTANEOUS | 0 refills | Status: DC

## 2018-11-04 NOTE — Telephone Encounter (Signed)
Refill request received from [facility]  Last Visit: 07/02/18 Next visit: 12/02/18 Labs: 10/05/18 WNL TB Gold: 07/02/18 Neg   Okay to refill per Dr. Estanislado Pandy

## 2018-11-18 NOTE — Progress Notes (Deleted)
Office Visit Note  Patient: Charles Jenkins             Date of Birth: Jun 09, 1960           MRN: [id number]             PCP: Glenda Chroman, MD Referring: Glenda Chroman, MD Visit Date: 12/02/2018 Occupation: @GUAROCC @  Subjective:  No chief complaint on file.   Humira 40 mg every 14 days, methotrexate 2.5 mg 3 tablets every 7 days, folic acid 1 mg 1 tablet daily.  Last TB gold negative on 07/02/2018 and will monitor yearly.  Most recent CBC/CMP within normal limits on 10/05/2018.  Due for CBC/CMP in September and will monitor every 3 months.  Standing orders are in place.  He received the flu vaccine in October and previously unspecified pneumonia vaccine.   Recommend yearly skin exams due to increased risk of melanoma.  History of Present Illness: Charles Jenkins is a 58 y.o. male ***   Activities of Daily Living:  Patient reports morning stiffness for *** {minute/hour:19697}.   Patient {ACTIONS;DENIES/REPORTS:21021675::"Denies"} nocturnal pain.  Difficulty dressing/grooming: {ACTIONS;DENIES/REPORTS:21021675::"Denies"} Difficulty climbing stairs: {ACTIONS;DENIES/REPORTS:21021675::"Denies"} Difficulty getting out of chair: {ACTIONS;DENIES/REPORTS:21021675::"Denies"} Difficulty using hands for taps, buttons, cutlery, and/or writing: {ACTIONS;DENIES/REPORTS:21021675::"Denies"}  No Rheumatology ROS completed.   PMFS History:  Patient Active Problem List   Diagnosis Date Noted  . Other meniscus derangements, posterior horn of medial meniscus, left knee 01/08/2017  . Family history of scleroderma 09/25/2016  . Obesity due to excess calories 09/25/2016  . Primary osteoarthritis of both hands 04/10/2016  . Primary osteoarthritis of both feet 04/10/2016  . High risk medication use 04/09/2016  . 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 Lumbar spinal stenosis 05/05/2014  . 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 Insomnia 03/10/2013   . Spinal stenosis of lumbar region 10/28/2012  . Sciatica 09/23/2012  . Essential hypertension, benign 07/24/2012  . Sacroiliitis ([place]) 07/24/2012  . Depression 07/24/2012    Past Medical History:  Diagnosis Date  . Ankylosing spondylitis ([place])   . Arthritis   . Back pain   . Chronic right SI joint pain   . Hypertension    "only in doctor's offices"    Family History  Problem Relation Age of Onset  . Scleroderma Mother    Past Surgical History:  Procedure Laterality Date  . KNEE ARTHROPLASTY Left 2018  . LUMBAR LAMINECTOMY/DECOMPRESSION MICRODISCECTOMY Left 10/28/2012   Procedure: MICRO LUMBAR DECOMPRESSION L4 - L5 and L5 - S1 2 LEVELS;  Surgeon: Johnn Hai, MD;  Location: [facility];  Service: Orthopedics;  Laterality: Left;  . LUMBAR LAMINECTOMY/DECOMPRESSION MICRODISCECTOMY N/A 05/04/2014   Procedure: MICRO LUMBAR DECOMPRESSION L2-L3, L3-L4;  Surgeon: Johnn Hai, MD;  Location: [facility];  Service: Orthopedics;  Laterality: N/A;   Social History   Social History Narrative  . Not on file   Immunization History  Administered Date(s) Administered  . Influenza,inj,quad, With Preservative 01/30/2017, 02/10/2018  . Influenza-Unspecified 02/27/2012, 01/27/2013, 02/08/2014  . Pneumococcal-Unspecified 02/27/2012     Objective: Vital Signs: There were no vitals taken for this visit.   Physical Exam   Musculoskeletal Exam: ***  CDAI Exam: CDAI Score: - Patient Global: -; Provider Global: - Swollen: -; Tender: - Joint Exam   No joint exam has been documented for this visit   There is currently no information documented on the homunculus. Go to the Rheumatology activity and complete the homunculus joint exam.  Investigation: No additional findings.  Imaging: No results found.  Recent Labs: Lab Results  Component Value Date   WBC 6.9 10/05/2018   HGB 14.3 10/05/2018   PLT 263 10/05/2018   NA 139 10/05/2018   K 4.2 10/05/2018   CL 101 10/05/2018   CO2 24  10/05/2018   GLUCOSE 88 10/05/2018   BUN 12 10/05/2018   CREATININE 0.87 10/05/2018   BILITOT 0.4 10/05/2018   ALKPHOS 83 10/05/2018   AST 30 10/05/2018   ALT 27 10/05/2018   PROT 7.0 10/05/2018   ALBUMIN 4.1 10/05/2018   CALCIUM 9.8 10/05/2018   GFRAA 112 10/05/2018   QFTBGOLDPLUS NEGATIVE 07/02/2018    Speciality Comments: No specialty comments available.  Procedures:  No procedures performed Allergies: Patient has no known allergies.   Assessment / Plan:     Visit Diagnoses: No diagnosis found.  Orders: No orders of the defined types were placed in this encounter.  No orders of the defined types were placed in this encounter.   Face-to-face time spent with patient was *** minutes. Greater than 50% of time was spent in counseling and coordination of care.  Follow-Up Instructions: No follow-ups on file.   Earnestine Mealing, CMA  Note - This record has been created using Editor, commissioning.  Chart creation errors have been sought, but may not always  have been located. Such creation errors do not reflect on  the standard of medical care.

## 2018-12-02 ENCOUNTER — Ambulatory Visit: Payer: Self-pay | Admitting: Rheumatology

## 2018-12-17 NOTE — Progress Notes (Deleted)
Office Visit Note  Patient: Charles Jenkins             Date of Birth: 20-Jun-1960           MRN: [id number]             PCP: Glenda Chroman, MD Referring: Glenda Chroman, MD Visit Date: 12/31/2018 Occupation: @GUAROCC @  Subjective:  No chief complaint on file.   History of Present Illness: Charles Jenkins is a 58 y.o. male ***   Activities of Daily Living:  Patient reports morning stiffness for *** {minute/hour:19697}.   Patient {ACTIONS;DENIES/REPORTS:21021675::"Denies"} nocturnal pain.  Difficulty dressing/grooming: {ACTIONS;DENIES/REPORTS:21021675::"Denies"} Difficulty climbing stairs: {ACTIONS;DENIES/REPORTS:21021675::"Denies"} Difficulty getting out of chair: {ACTIONS;DENIES/REPORTS:21021675::"Denies"} Difficulty using hands for taps, buttons, cutlery, and/or writing: {ACTIONS;DENIES/REPORTS:21021675::"Denies"}  No Rheumatology ROS completed.   PMFS History:  Patient Active Problem List   Diagnosis Date Noted  . Other meniscus derangements, posterior horn of medial meniscus, left knee 01/08/2017  . Family history of scleroderma 09/25/2016  . Obesity due to excess calories 09/25/2016  . Primary osteoarthritis of both hands 04/10/2016  . Primary osteoarthritis of both feet 04/10/2016  . High risk medication use 04/09/2016  . 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 Lumbar spinal stenosis 05/05/2014  . 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 Insomnia 03/10/2013  . Spinal stenosis of lumbar region 10/28/2012  . Sciatica 09/23/2012  . Essential hypertension, benign 07/24/2012  . Sacroiliitis ([place]) 07/24/2012  . Depression 07/24/2012    Past Medical History:  Diagnosis Date  . Ankylosing spondylitis ([place])   . Arthritis   . Back pain   . Chronic right SI joint pain   . Hypertension    "only in doctor's offices"    Family History  Problem Relation Age of Onset  . Scleroderma Mother    Past Surgical  History:  Procedure Laterality Date  . KNEE ARTHROPLASTY Left 2018  . LUMBAR LAMINECTOMY/DECOMPRESSION MICRODISCECTOMY Left 10/28/2012   Procedure: MICRO LUMBAR DECOMPRESSION L4 - L5 and L5 - S1 2 LEVELS;  Surgeon: Johnn Hai, MD;  Location[facility];  Service: Orthopedics;  Laterality: Left;  . LUMBAR LAMINECTOMY/DECOMPRESSION MICRODISCECTOMY N/A 05/04/2014   Procedure: MICRO LUMBAR DECOMPRESSION L2-L3, L3-L4;  Surgeon: Johnn Hai, MD;  Location: [facility];  Service: Orthopedics;  Laterality: N/A;   Social History   Social History Narrative  . Not on file   Immunization History  Administered Date(s) Administered  . Influenza,inj,quad, With Preservative 01/30/2017, 02/10/2018  . Influenza-Unspecified 02/27/2012, 01/27/2013, 02/08/2014  . Pneumococcal-Unspecified 02/27/2012     Objective: Vital Signs: There were no vitals taken for this visit.   Physical Exam   Musculoskeletal Exam: ***  CDAI Exam: CDAI Score: - Patient Global: -; Provider Global: - Swollen: -; Tender: - Joint Exam   No joint exam has been documented for this visit   There is currently no information documented on the homunculus. Go to the Rheumatology activity and complete the homunculus joint exam.  Investigation: No additional findings.  Imaging: No results found.  Recent Labs: Lab Results  Component Value Date   WBC 6.9 10/05/2018   HGB 14.3 10/05/2018   PLT 263 10/05/2018   NA 139 10/05/2018   K 4.2 10/05/2018   CL 101 10/05/2018   CO2 24 10/05/2018   GLUCOSE 88 10/05/2018   BUN 12 10/05/2018   CREATININE 0.87 10/05/2018   BILITOT 0.4 10/05/2018   ALKPHOS 83 10/05/2018   AST 30 10/05/2018  ALT 27 10/05/2018   PROT 7.0 10/05/2018   ALBUMIN 4.1 10/05/2018   CALCIUM 9.8 10/05/2018   GFRAA 112 10/05/2018   QFTBGOLDPLUS NEGATIVE 07/02/2018    Speciality Comments: No specialty comments available.  Procedures:  No procedures performed Allergies: Patient has no known allergies.    Assessment / Plan:     Visit Diagnoses: No diagnosis found.  Orders: No orders of the defined types were placed in this encounter.  No orders of the defined types were placed in this encounter.   Face-to-face time spent with patient was *** minutes. Greater than 50% of time was spent in counseling and coordination of care.  Follow-Up Instructions: No follow-ups on file.   Earnestine Mealing, CMA  Note - This record has been created using Editor, commissioning.  Chart creation errors have been sought, but may not always  have been located. Such creation errors do not reflect on  the standard of medical care.

## 2018-12-31 ENCOUNTER — Ambulatory Visit: Payer: PRIVATE HEALTH INSURANCE | Admitting: Rheumatology

## 2019-01-06 NOTE — Progress Notes (Signed)
Office Visit Note  Patient: Charles Jenkins             Date of Birth: 1960/07/22           MRN: [id number]             PCP: Glenda Chroman, MD Referring: Glenda Chroman, MD Visit Date: 01/20/2019 Occupation: @GUAROCC @  Subjective:  Left foot pain    History of Present Illness: Charles Jenkins is a 58 y.o. male with history of spondyloarthropathy. He is on Humira 40 mg sq injections every 14 days, MTX 3 tablets po once weekly, and folic acid 1 mg po daily. He has not missed any doses.  He denies any recent flares.  He denies any C-spine, thoracic spine, or lumbar spine pain.  He has occasional left SI joint pain.  He has morning stiffness 15-20 minutes. He reports he continues to have pain at the base of the left 5th metatarsal. He denies any joint swelling.  He would like an injection today.  He states he has bilateral trigger thumbs.  He often has difficulty using silverware when they lock up.      Activities of Daily Living:  Patient reports morning stiffness for 15-20 minutes.   Patient Denies nocturnal pain.  Difficulty dressing/grooming: Denies Difficulty climbing stairs: Reports Difficulty getting out of chair: Denies Difficulty using hands for taps, buttons, cutlery, and/or writing: Reports  Review of Systems  Constitutional: Negative for fatigue and night sweats.  HENT: Negative for mouth sores, mouth dryness and nose dryness.   Eyes: Negative for redness and dryness.  Respiratory: Negative for cough, hemoptysis, shortness of breath and difficulty breathing.   Cardiovascular: Negative for chest pain, palpitations, hypertension, irregular heartbeat and swelling in legs/feet.  Gastrointestinal: Negative for blood in stool, constipation and diarrhea.  Endocrine: Negative for increased urination.  Genitourinary: Negative for painful urination.  Musculoskeletal: Positive for arthralgias, joint pain and morning stiffness. Negative for joint swelling, myalgias, muscle weakness,  muscle tenderness and myalgias.  Skin: Negative for color change, rash, hair loss, nodules/bumps, skin tightness, ulcers and sensitivity to sunlight.  Allergic/Immunologic: Negative for susceptible to infections.  Neurological: Negative for dizziness, fainting, memory loss, night sweats and weakness.  Hematological: Negative for swollen glands.  Psychiatric/Behavioral: Negative for depressed mood and sleep disturbance. The patient is not nervous/anxious.     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   Insomnia 03/10/2013   Spinal stenosis of lumbar region 10/28/2012   Sciatica 09/23/2012   Essential hypertension, benign 07/24/2012   Sacroiliitis ([place]) 07/24/2012   Depression 07/24/2012    Past Medical History:  Diagnosis Date   Ankylosing spondylitis ([place])    Arthritis    Back pain    Chronic right SI joint pain    Hypertension    "only in doctor's offices"    Family History  Problem Relation Age of Onset   Scleroderma Mother    Past Surgical History:  Procedure Laterality Date   KNEE ARTHROPLASTY Left 2018  LUMBAR LAMINECTOMY/DECOMPRESSION MICRODISCECTOMY Left 10/28/2012   Procedure: MICRO LUMBAR DECOMPRESSION L4 - L5 and L5 - S1 2 LEVELS;  Surgeon: Johnn Hai, MD;  Location: [facility];  Service: Orthopedics;  Laterality: Left;   LUMBAR LAMINECTOMY/DECOMPRESSION MICRODISCECTOMY N/A 05/04/2014   Procedure: MICRO LUMBAR DECOMPRESSION L2-L3, L3-L4;   Surgeon: Johnn Hai, MD;  Location: [facility];  Service: Orthopedics;  Laterality: N/A;   Social History   Social History Narrative   Not on file   Immunization History  Administered Date(s) Administered   Influenza,inj,quad, With Preservative 01/30/2017, 02/10/2018   Influenza-Unspecified 02/27/2012, 01/27/2013, 02/08/2014   Pneumococcal-Unspecified 02/27/2012     Objective: Vital Signs: BP (!) 156/79 (BP Location: Left Arm, Patient Position: Sitting, Cuff Size: Large)    Pulse 72    Resp 15    Ht 6' (1.829 m)    Wt (!) 350 lb (158.8 kg)    BMI 47.47 kg/m    Physical Exam Vitals signs and nursing note reviewed.  Constitutional:      Appearance: He is well-developed.  HENT:     Head: Normocephalic and atraumatic.  Eyes:     Conjunctiva/sclera: Conjunctivae normal.     Pupils: Pupils are equal, round, and reactive to light.  Neck:     Musculoskeletal: Normal range of motion and neck supple.  Cardiovascular:     Rate and Rhythm: Normal rate and regular rhythm.     Heart sounds: Normal heart sounds.  Pulmonary:     Effort: Pulmonary effort is normal.     Breath sounds: Normal breath sounds.  Abdominal:     General: Bowel sounds are normal.     Palpations: Abdomen is soft.  Skin:    General: Skin is warm and dry.     Capillary Refill: Capillary refill takes less than 2 seconds.  Neurological:     Mental Status: He is alert and oriented to person, place, and time.  Psychiatric:        Behavior: Behavior normal.      Musculoskeletal Exam: C-spine, thoracic spine, and lumbar spine good ROM.  No midline spinal tenderness.  No SI joint tenderness.  Shoulder joints, elbow joints, wrist joints, MCPs, PIPs, and DIPs good ROM with no synovitis.  Trigger thumbs bilaterally.  Hip joints, knee joints, ankle joints. MTPs, PIPs, and DIPs good ROM with no synovitis.  No warmth or effusion of knee joints.  No tenderness or swelling of ankle joints.  He has tenderness at the base of  the left 5th metatarsal.   CDAI Exam: CDAI Score: -- Patient Global: --; Provider Global: -- Swollen: --; Tender: -- Joint Exam   No joint exam has been documented for this visit   There is currently no information documented on the homunculus. Go to the Rheumatology activity and complete the homunculus joint exam.  Investigation: No additional findings.  Imaging: No results found.  Recent Labs: Lab Results  Component Value Date   WBC 6.9 10/05/2018   HGB 14.3 10/05/2018   PLT 263 10/05/2018   NA 139 10/05/2018   K 4.2 10/05/2018   CL 101 10/05/2018   CO2 24 10/05/2018   GLUCOSE 88 10/05/2018   BUN 12 10/05/2018   CREATININE 0.87 10/05/2018   BILITOT 0.4 10/05/2018   ALKPHOS 83 10/05/2018   AST 30 10/05/2018   ALT 27 10/05/2018   PROT 7.0 10/05/2018   ALBUMIN 4.1 10/05/2018   CALCIUM 9.8 10/05/2018   GFRAA 112 10/05/2018   QFTBGOLDPLUS NEGATIVE 07/02/2018  Speciality Comments: Approved for Humira PAP through [facility] through 04/29/2019.  Procedures:  Small Joint Inj (Left 5th metatarsal-tarsal joint ) on 01/20/2019 8:34 AM Indications: pain Details: 27 G needle, ultrasound-guided Medications: 0.5 mL lidocaine 1 %; 10 mg triamcinolone acetonide 40 MG/ML Aspirate: 0 mL Outcome: tolerated well, no immediate complications Procedure, treatment alternatives, risks and benefits explained, specific risks discussed. Consent was given by the patient. Immediately prior to procedure a time out was called to verify the correct patient, procedure, equipment, support staff and site/side marked as required. Patient was prepped and draped in the usual sterile fashion.     Allergies: Patient has no known allergies.   Assessment / Plan:     Visit Diagnoses: Spondyloarthropathy: He has not had any recent flares.  He is clinically doing well on Humira 40 mg subcutaneous injections every 14 days, methotrexate 3 tablets by mouth once weekly, folic acid 1 mg by mouth daily.   C-spine, thoracic spine, lumbar center good range of motion with no discomfort.  No midline spinal tenderness.  He has no SI joint tenderness at this time.  He has occasional left SI joint pain.  His morning stiffness has been lasting about 15 to 20 minutes.  He has no synovitis on exam.  He continues to have tenderness at the base of the fifth metatarsal.  He requested a cortisone injection today.  He tolerated procedure well.  Procedure note will be completed above.  He will continue on Humira and methotrexate as prescribed.  A refill methotrexate was sent to the pharmacy.  He was advised to notify us if he develops increased joint pain or joint swelling.  He will follow-up in the office in 5 months.  High risk medication use - Humira 40 mg sq injections every 14 days, methotrexate 3 tablets weekly and folic acid 1 mg daily.  CBC and CMP drawn on 01/15/19.  WBC count WNL.  No signs of anemia.  LFTs and creatinine/GFR WNL.  Lipid panel WNL.  TB gold negative on 07/02/18.    Primary osteoarthritis of both hands: He has PIP and DIP synovial thickening consistent with osteoarthritis of both hands.  He has no tenderness or synovitis.  He has complete fist formation bilaterally.  Joint protection and muscle strengthening were discussed.   Trigger thumb of both hands: He has tenderness and intermittent triggering.  He has been using Voltaren gel topically.  He has had difficulty with silverware when his thumbs lock up.  Primary osteoarthritis of both feet: He presents today with ongoing tenderness at the base of the left fifth metatarsal.  He had x-rays on 07/02/2018 which were consistent with osteoarthritis.  He has some spurring over the fifth metatarsal.  He requested a cortisone injection today.  He tolerated the procedure well.  The procedure note was completed above.  He is not experiencing any right foot pain at this time.  Pain in left foot: He has tenderness at the base of the fifth metatarsal.  X-rays were  obtained on 07/02/2018 which revealed findings consistent with osteoarthritis and spurring over the fifth metatarsal.  He requested a cortisone injection today.  An ultrasound guided injection was performed.  Procedure note was completed above.  Sacroiliitis [facility]): He has no SI joint tenderness at this time.  He occasionally has left SI joint pain and stiffness.    S/P arthroscopic surgery of left knee: He has good ROM with no discomfort.   DDD (degenerative disc disease), lumbar: He has no lower  back pain at this time.  He has no symptoms of radiculopathy.   Other medical conditions are listed as follows:   Primary insomnia  History of colon polyps  History of hypertension  BMI 45.0-49.9, adult ([place])  Family history of scleroderma  Orders: Orders Placed This Encounter  Procedures   Foot Inj   Small Joint Inj   Meds ordered this encounter  Medications   methotrexate (RHEUMATREX) 2.5 MG tablet    Sig: TAKE THREE TABLETS BY MOUTH EVERY SEVEN DAYS    Dispense:  36 tablet    Refill:  0    Face-to-face time spent with patient was 30 minutes. Greater than 50% of time was spent in counseling and coordination of care.  Follow-Up Instructions: Return in about 5 months (around 06/22/2019) for Spondyloarthropathy.   Charles Neas, PA-C  Note - This record has been created using Dragon software.  Chart creation errors have been sought, but may not always  have been located. Such creation errors do not reflect on  the standard of medical care.

## 2019-01-20 ENCOUNTER — Other Ambulatory Visit: Payer: Self-pay

## 2019-01-20 ENCOUNTER — Encounter: Payer: Self-pay | Admitting: Physician Assistant

## 2019-01-20 ENCOUNTER — Ambulatory Visit (INDEPENDENT_AMBULATORY_CARE_PROVIDER_SITE_OTHER): Payer: Medicare Other | Admitting: Physician Assistant

## 2019-01-20 VITALS — BP 156/79 | HR 72 | Resp 15 | Ht 72.0 in | Wt 350.0 lb

## 2019-01-20 DIAGNOSIS — M47819 Spondylosis without myelopathy or radiculopathy, site unspecified: Secondary | ICD-10-CM | POA: Diagnosis not present

## 2019-01-20 DIAGNOSIS — Z8679 Personal history of other diseases of the circulatory system: Secondary | ICD-10-CM

## 2019-01-20 DIAGNOSIS — M19071 Primary osteoarthritis, right ankle and foot: Secondary | ICD-10-CM | POA: Diagnosis not present

## 2019-01-20 DIAGNOSIS — Z6841 Body Mass Index (BMI) 40.0 and over, adult: Secondary | ICD-10-CM

## 2019-01-20 DIAGNOSIS — M5136 Other intervertebral disc degeneration, lumbar region: Secondary | ICD-10-CM

## 2019-01-20 DIAGNOSIS — Z79899 Other long term (current) drug therapy: Secondary | ICD-10-CM | POA: Diagnosis not present

## 2019-01-20 DIAGNOSIS — M19041 Primary osteoarthritis, right hand: Secondary | ICD-10-CM

## 2019-01-20 DIAGNOSIS — M461 Sacroiliitis, not elsewhere classified: Secondary | ICD-10-CM

## 2019-01-20 DIAGNOSIS — M65312 Trigger thumb, left thumb: Secondary | ICD-10-CM

## 2019-01-20 DIAGNOSIS — M65311 Trigger thumb, right thumb: Secondary | ICD-10-CM

## 2019-01-20 DIAGNOSIS — Z860100 Personal history of colon polyps, unspecified: Secondary | ICD-10-CM

## 2019-01-20 DIAGNOSIS — M19042 Primary osteoarthritis, left hand: Secondary | ICD-10-CM

## 2019-01-20 DIAGNOSIS — F5101 Primary insomnia: Secondary | ICD-10-CM

## 2019-01-20 DIAGNOSIS — Z9889 Other specified postprocedural states: Secondary | ICD-10-CM

## 2019-01-20 DIAGNOSIS — M19072 Primary osteoarthritis, left ankle and foot: Secondary | ICD-10-CM

## 2019-01-20 DIAGNOSIS — Z8269 Family history of other diseases of the musculoskeletal system and connective tissue: Secondary | ICD-10-CM

## 2019-01-20 DIAGNOSIS — M79672 Pain in left foot: Secondary | ICD-10-CM

## 2019-01-20 DIAGNOSIS — M51369 Other intervertebral disc degeneration, lumbar region without mention of lumbar back pain or lower extremity pain: Secondary | ICD-10-CM

## 2019-01-20 MED ORDER — TRIAMCINOLONE ACETONIDE 40 MG/ML IJ SUSP
10.0000 mg | INTRAMUSCULAR | Status: AC | PRN
  Administered 2019-01-20: 10 mg via INTRA_ARTICULAR

## 2019-01-20 MED ORDER — LIDOCAINE HCL 1 % IJ SOLN
0.5000 mL | INTRAMUSCULAR | Status: AC | PRN
  Administered 2019-01-20: .5 mL

## 2019-01-20 MED ORDER — METHOTREXATE 2.5 MG PO TABS: ORAL_TABLET | ORAL | 0 refills | Status: DC

## 2019-02-15 ENCOUNTER — Other Ambulatory Visit: Payer: Self-pay | Admitting: Rheumatology

## 2019-02-16 NOTE — Telephone Encounter (Signed)
Last Visit: 01/20/19 Next Visit: 06/23/19  Okay to refill per Dr. Estanislado Pandy

## 2019-03-03 ENCOUNTER — Other Ambulatory Visit: Payer: Self-pay | Admitting: *Deleted

## 2019-03-03 MED ORDER — HUMIRA (2 PEN) 40 MG/0.4ML ~~LOC~~ AJKT: 40.0000 mg | AUTO-INJECTOR | SUBCUTANEOUS | 0 refills | Status: DC

## 2019-03-03 NOTE — Telephone Encounter (Signed)
Refill request received from [facility]   Last Visit: 01/20/19 Next Visit: 06/23/19 Labs: 01/15/19 EOS 1.2 A/G Ratio 1.1 TB Gold: 07/02/18  Okay to refill per Dr. Estanislado Pandy

## 2019-04-19 ENCOUNTER — Telehealth: Payer: Self-pay | Admitting: Rheumatology

## 2019-04-19 ENCOUNTER — Other Ambulatory Visit: Payer: Self-pay | Admitting: Physician Assistant

## 2019-04-19 DIAGNOSIS — Z79899 Other long term (current) drug therapy: Secondary | ICD-10-CM

## 2019-04-19 NOTE — Telephone Encounter (Signed)
Last Visit: 01/20/2019 Next Visit: 06/23/2019 Labs: 01/15/2019  Advised patient he is due to update labs, patient verbalized understanding and will have his PCP fax over recent labs.   Okay to refill per Dr. Estanislado Pandy.

## 2019-04-19 NOTE — Telephone Encounter (Signed)
Lab orders released for [facility].  

## 2019-04-19 NOTE — Telephone Encounter (Signed)
Patient called requesting his labwork orders be sent to [facility] in [place]   Patient states he will be going on Monday, 04/26/19.  Patient states he thought he had labwork with his PCP, but his last labs were on 01/15/19.

## 2019-05-06 DIAGNOSIS — G4733 Obstructive sleep apnea (adult) (pediatric): Secondary | ICD-10-CM | POA: Diagnosis not present

## 2019-05-14 ENCOUNTER — Other Ambulatory Visit: Payer: Self-pay

## 2019-05-14 ENCOUNTER — Ambulatory Visit: Payer: PRIVATE HEALTH INSURANCE | Attending: Internal Medicine

## 2019-05-14 DIAGNOSIS — Z20822 Contact with and (suspected) exposure to covid-19: Secondary | ICD-10-CM

## 2019-05-15 LAB — NOVEL CORONAVIRUS, NAA: SARS-CoV-2, NAA: NOT DETECTED

## 2019-05-17 ENCOUNTER — Telehealth: Payer: Self-pay

## 2019-05-17 DIAGNOSIS — Z299 Encounter for prophylactic measures, unspecified: Secondary | ICD-10-CM | POA: Diagnosis not present

## 2019-05-17 DIAGNOSIS — I10 Essential (primary) hypertension: Secondary | ICD-10-CM | POA: Diagnosis not present

## 2019-05-17 DIAGNOSIS — M545 Low back pain: Secondary | ICD-10-CM | POA: Diagnosis not present

## 2019-05-17 DIAGNOSIS — J069 Acute upper respiratory infection, unspecified: Secondary | ICD-10-CM | POA: Diagnosis not present

## 2019-05-17 DIAGNOSIS — J029 Acute pharyngitis, unspecified: Secondary | ICD-10-CM | POA: Diagnosis not present

## 2019-05-17 NOTE — Telephone Encounter (Signed)
Patients wife called (on DPR) and she was informed that her husbands COVID-19 test 05/14/19 was negative. He is not infected with the Novel Coronavirus.  She verbalized understanding. He has not had an exposure but was having symptoms per wife.  She was told if symptoms continue he should reach out to his PCP for further evaluation.  She verbalized understanding.

## 2019-05-25 DIAGNOSIS — Z79899 Other long term (current) drug therapy: Secondary | ICD-10-CM | POA: Diagnosis not present

## 2019-05-26 LAB — CMP14+EGFR
ALT: 21 IU/L (ref 0–44)
AST: 23 IU/L (ref 0–40)
Albumin/Globulin Ratio: 1.1 — ABNORMAL LOW (ref 1.2–2.2)
Albumin: 4.1 g/dL (ref 3.8–4.9)
Alkaline Phosphatase: 79 IU/L (ref 39–117)
BUN/Creatinine Ratio: 9 (ref 9–20)
BUN: 8 mg/dL (ref 6–24)
Bilirubin Total: 0.6 mg/dL (ref 0.0–1.2)
CO2: 25 mmol/L (ref 20–29)
Calcium: 9.7 mg/dL (ref 8.7–10.2)
Chloride: 101 mmol/L (ref 96–106)
Creatinine, Ser: 0.91 mg/dL (ref 0.76–1.27)
GFR calc Af Amer: 109 mL/min/{1.73_m2} (ref >59)
GFR calc non Af Amer: 94 mL/min/{1.73_m2} (ref >59)
Globulin, Total: 3.6 g/dL (ref 1.5–4.5)
Glucose: 80 mg/dL (ref 65–99)
Potassium: 4.5 mmol/L (ref 3.5–5.2)
Sodium: 140 mmol/L (ref 134–144)
Total Protein: 7.7 g/dL (ref 6.0–8.5)

## 2019-05-26 LAB — CBC WITH DIFFERENTIAL/PLATELET
Basophils Absolute: 0.1 10*3/uL (ref 0.0–0.2)
Basos: 1 %
EOS (ABSOLUTE): 0.7 10*3/uL — ABNORMAL HIGH (ref 0.0–0.4)
Eos: 9 %
Hematocrit: 46.5 % (ref 37.5–51.0)
Hemoglobin: 15.5 g/dL (ref 13.0–17.7)
Immature Grans (Abs): 0 10*3/uL (ref 0.0–0.1)
Immature Granulocytes: 0 %
Lymphocytes Absolute: 2.1 10*3/uL (ref 0.7–3.1)
Lymphs: 27 %
MCH: 29.6 pg (ref 26.6–33.0)
MCHC: 33.3 g/dL (ref 31.5–35.7)
MCV: 89 fL (ref 79–97)
Monocytes Absolute: 0.8 10*3/uL (ref 0.1–0.9)
Monocytes: 11 %
Neutrophils Absolute: 4.1 10*3/uL (ref 1.4–7.0)
Neutrophils: 52 %
Platelets: 293 10*3/uL (ref 150–450)
RBC: 5.23 x10E6/uL (ref 4.14–5.80)
RDW: 13 % (ref 11.6–15.4)
WBC: 8 10*3/uL (ref 3.4–10.8)

## 2019-05-26 NOTE — Telephone Encounter (Signed)
CBC and CMP normal

## 2019-05-28 DIAGNOSIS — I10 Essential (primary) hypertension: Secondary | ICD-10-CM | POA: Diagnosis not present

## 2019-06-01 ENCOUNTER — Telehealth: Payer: Self-pay | Admitting: Rheumatology

## 2019-06-01 NOTE — Telephone Encounter (Signed)
Patient will be completely out of Humira Sunday. Patient states Dina Rich is still waiting on paperwork for reapplication of patient assist program. Patient states he brought those papers here his last office visit. Please call to advise.

## 2019-06-01 NOTE — Telephone Encounter (Signed)
We will follow up with patient tomorrow when we are back in the office with received PAP paperwork.

## 2019-06-02 NOTE — Telephone Encounter (Signed)
Patient returned call, he has more Humira on hand than previously stated. He will wait for application to come in the mail and will bring completed application to the office. Patient will ask for sample when he comes for his appointment on 06/23/19.  10:31 AM Beatriz Chancellor, CPhT

## 2019-06-02 NOTE — Telephone Encounter (Signed)
Returned patient's call- had to leave a message, we have not received his portion of the [employer]. We have submitted the provider portion. Advised patient we will mail him a copy of the application and that he can contact the office to pickup an Humira sample.  9:14 AM Beatriz Chancellor, CPhT

## 2019-06-06 DIAGNOSIS — G4733 Obstructive sleep apnea (adult) (pediatric): Secondary | ICD-10-CM | POA: Diagnosis not present

## 2019-06-16 ENCOUNTER — Telehealth: Payer: Self-pay | Admitting: Rheumatology

## 2019-06-16 NOTE — Telephone Encounter (Signed)
Patient called stating he filled out his part of the application and sent it to [place].  Patient states he called and was told they are "still waiting for Dr. Arlean Hopping part so it can be processed."  Patient is requesting a return call.

## 2019-06-16 NOTE — Progress Notes (Signed)
Office Visit Note  Patient: Charles Jenkins             Date of Birth: August 10, 1960           MRN: [id number]             PCP: Glenda Chroman, MD Referring: Glenda Chroman, MD Visit Date: 06/23/2019 Occupation: @GUAROCC @  Subjective:  Medication monitoring   History of Present Illness: Charles Jenkins is a 59 y.o. male with history of a spondyloarthropathy.  He states he has been doing well on Humira every other week and methotrexate 3 tablets/week.  He denies any joint pain or joint swelling currently.  He states the trigger thumb injections worked.  He denies any sacroiliitis.  He is currently not having any lower back pain.  Activities of Daily Living:  Patient reports morning stiffness for 15 minutes.   Patient Denies nocturnal pain.  Difficulty dressing/grooming: Denies Difficulty climbing stairs: Denies Difficulty getting out of chair: Denies Difficulty using hands for taps, buttons, cutlery, and/or writing: Denies  Review of Systems  Constitutional: Negative for fatigue and night sweats.  HENT: Negative for mouth sores, mouth dryness and nose dryness.   Eyes: Negative for redness and dryness.  Respiratory: Negative for shortness of breath and difficulty breathing.   Cardiovascular: Negative for chest pain, palpitations, hypertension, irregular heartbeat and swelling in legs/feet.  Gastrointestinal: Negative for constipation and diarrhea.  Endocrine: Negative for excessive thirst and increased urination.  Genitourinary: Negative for difficulty urinating.  Musculoskeletal: Positive for arthralgias, joint pain and morning stiffness. Negative for joint swelling, myalgias, muscle weakness, muscle tenderness and myalgias.  Skin: Negative for color change, rash, hair loss, nodules/bumps, skin tightness, ulcers and sensitivity to sunlight.  Allergic/Immunologic: Negative for susceptible to infections.  Neurological: Negative for dizziness, fainting, numbness, memory loss, night  sweats and weakness ( ).  Hematological: Negative for bruising/bleeding tendency and swollen glands.  Psychiatric/Behavioral: Negative for depressed mood and sleep disturbance. The patient is not nervous/anxious.     PMFS History:  Patient Active Problem List   Diagnosis Date Noted  . Other meniscus derangements, posterior horn of medial meniscus, left knee 01/08/2017  . Family history of scleroderma 09/25/2016  . Obesity due to excess calories 09/25/2016  . Primary osteoarthritis of both hands 04/10/2016  . Primary osteoarthritis of both feet 04/10/2016  . High risk medication use 04/09/2016  . 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 Lumbar spinal stenosis 05/05/2014  . 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 Insomnia 03/10/2013  . Spinal stenosis of lumbar region 10/28/2012  . Sciatica 09/23/2012  . Essential hypertension, benign 07/24/2012  . Sacroiliitis ([place]) 07/24/2012  . Depression 07/24/2012    Past Medical History:  Diagnosis Date  . Ankylosing spondylitis ([place])   . Arthritis   . Back pain   . Chronic right SI joint pain   . Hypertension    "only in doctor's offices"    Family History  Problem Relation Age of Onset  . Scleroderma Mother    Past Surgical History:  Procedure Laterality Date  . KNEE ARTHROPLASTY Left 2018  . LUMBAR LAMINECTOMY/DECOMPRESSION MICRODISCECTOMY Left 10/28/2012   Procedure: MICRO LUMBAR DECOMPRESSION L4 - L5 and L5 - S1 2 LEVELS;  Surgeon: Johnn Hai, MD;  Location: [facility];  Service: Orthopedics;  Laterality: Left;  . LUMBAR LAMINECTOMY/DECOMPRESSION MICRODISCECTOMY N/A 05/04/2014   Procedure: MICRO LUMBAR DECOMPRESSION L2-L3, L3-L4;  Surgeon: Johnn Hai, MD;  Location:  [facility];  Service: Orthopedics;  Laterality: N/A;   Social History   Social History Narrative  . Not on file   Immunization History  Administered Date(s) Administered  . Influenza,inj,quad, With  Preservative 01/30/2017, 02/10/2018  . Influenza-Unspecified 02/27/2012, 01/27/2013, 02/08/2014  . Pneumococcal-Unspecified 02/27/2012     Objective: Vital Signs: BP (!) 144/82 (BP Location: Left Arm, Patient Position: Sitting, Cuff Size: Normal)   Pulse 71   Resp 18   Ht 6' (1.829 m)   Wt (!) 359 lb (162.8 kg)   BMI 48.69 kg/m    Physical Exam Vitals and nursing note reviewed.  Constitutional:      Appearance: He is well-developed.  HENT:     Head: Normocephalic and atraumatic.  Eyes:     Conjunctiva/sclera: Conjunctivae normal.     Pupils: Pupils are equal, round, and reactive to light.  Cardiovascular:     Rate and Rhythm: Normal rate and regular rhythm.     Heart sounds: Normal heart sounds.  Pulmonary:     Effort: Pulmonary effort is normal.     Breath sounds: Normal breath sounds.  Abdominal:     General: Bowel sounds are normal.     Palpations: Abdomen is soft.  Musculoskeletal:     Cervical back: Normal range of motion and neck supple.  Skin:    General: Skin is warm and dry.     Capillary Refill: Capillary refill takes less than 2 seconds.  Neurological:     Mental Status: He is alert and oriented to person, place, and time.  Psychiatric:        Behavior: Behavior normal.      Musculoskeletal Exam: C-spine thoracic and lumbar spine were in good range of motion.  He had no SI joint tenderness.  Shoulder joints elbow joints wrist joint MCPs PIPs DIPs with good range of motion with no synovitis.  Hip joints, knee joints, ankles and MTPs with good range of motion with no synovitis.  CDAI Exam: CDAI Score: -- Patient Global: --; Provider Global: -- Swollen: --; Tender: -- Joint Exam 06/23/2019   No joint exam has been documented for this visit   There is currently no information documented on the homunculus. Go to the Rheumatology activity and complete the homunculus joint exam.  Investigation: No additional findings.  Imaging: No results  found.  Recent Labs: Lab Results  Component Value Date   WBC 8.0 05/25/2019   HGB 15.5 05/25/2019   PLT 293 05/25/2019   NA 140 05/25/2019   K 4.5 05/25/2019   CL 101 05/25/2019   CO2 25 05/25/2019   GLUCOSE 80 05/25/2019   BUN 8 05/25/2019   CREATININE 0.91 05/25/2019   BILITOT 0.6 05/25/2019   ALKPHOS 79 05/25/2019   AST 23 05/25/2019   ALT 21 05/25/2019   PROT 7.7 05/25/2019   ALBUMIN 4.1 05/25/2019   CALCIUM 9.7 05/25/2019   GFRAA 109 05/25/2019   QFTBGOLDPLUS NEGATIVE 07/02/2018    Speciality Comments: Approved for Humira PAP through [employer] through 04/29/2019.  Procedures:  No procedures performed Allergies: Patient has no known allergies.   Assessment / Plan:     Visit Diagnoses: Spondyloarthropathy-patient had no synovitis on examination.  There is no history of Achilles tendinitis or plantar fasciitis.  He had no SI joint tenderness on examination today.  He has been doing well on current regimen.  High risk medication use - Humira 40 mg sq injections every 14 days, methotrexate 3 tablets weekly and folic acid 1  mg daily.  His labs from January were within normal limits.  We will continue to monitor labs every 3 months.  His TB gold is due in March we will do it with his April labs.  Primary osteoarthritis of both hands-joint protection was discussed.  Primary osteoarthritis of both feet-he is doing better with proper fitting shoes.  Sacroiliitis (HCC)-he had no SI joint tenderness today.  S/P arthroscopic surgery of left knee  Trigger thumb of both hands-resolved after injections.  DDD (degenerative disc disease), lumbar-currently not having much discomfort.  Primary insomnia-good sleep hygiene was discussed.  History of hypertension-his systolic blood pressure is a still elevated.  He states his medications were adjusted recently.  History of colon polyps  BMI 45.0-49.9, adult (HCC)-weight loss diet and exercise was discussed.  Family  history of scleroderma  Orders: Orders Placed This Encounter  Procedures  . QuantiFERON-TB Gold Plus   No orders of the defined types were placed in this encounter.    Follow-Up Instructions: Return in about 5 months (around 11/20/2019) for Spondyloarthropathy.   Bo Merino, MD  Note - This record has been created using Editor, commissioning.  Chart creation errors have been sought, but may not always  have been located. Such creation errors do not reflect on  the standard of medical care.

## 2019-06-16 NOTE — Telephone Encounter (Signed)
Refaxed patient's provider portion to [place] patient to advise, left message.

## 2019-06-18 NOTE — Telephone Encounter (Signed)
Received approval for Patient Assistance from [facility] for Humira. Patient has been approved for assistance through 04/28/20.  Will send document to scan center.  Phone#[phone]

## 2019-06-23 ENCOUNTER — Other Ambulatory Visit: Payer: Self-pay

## 2019-06-23 ENCOUNTER — Encounter: Payer: Self-pay | Admitting: Physician Assistant

## 2019-06-23 ENCOUNTER — Ambulatory Visit: Payer: Medicare Other | Admitting: Rheumatology

## 2019-06-23 VITALS — BP 144/82 | HR 71 | Resp 18 | Ht 72.0 in | Wt 359.0 lb

## 2019-06-23 DIAGNOSIS — Z8679 Personal history of other diseases of the circulatory system: Secondary | ICD-10-CM

## 2019-06-23 DIAGNOSIS — M47819 Spondylosis without myelopathy or radiculopathy, site unspecified: Secondary | ICD-10-CM

## 2019-06-23 DIAGNOSIS — F5101 Primary insomnia: Secondary | ICD-10-CM

## 2019-06-23 DIAGNOSIS — Z9889 Other specified postprocedural states: Secondary | ICD-10-CM

## 2019-06-23 DIAGNOSIS — M19072 Primary osteoarthritis, left ankle and foot: Secondary | ICD-10-CM

## 2019-06-23 DIAGNOSIS — M51369 Other intervertebral disc degeneration, lumbar region without mention of lumbar back pain or lower extremity pain: Secondary | ICD-10-CM

## 2019-06-23 DIAGNOSIS — M19041 Primary osteoarthritis, right hand: Secondary | ICD-10-CM

## 2019-06-23 DIAGNOSIS — M5136 Other intervertebral disc degeneration, lumbar region: Secondary | ICD-10-CM

## 2019-06-23 DIAGNOSIS — M19071 Primary osteoarthritis, right ankle and foot: Secondary | ICD-10-CM

## 2019-06-23 DIAGNOSIS — M19042 Primary osteoarthritis, left hand: Secondary | ICD-10-CM

## 2019-06-23 DIAGNOSIS — Z8269 Family history of other diseases of the musculoskeletal system and connective tissue: Secondary | ICD-10-CM

## 2019-06-23 DIAGNOSIS — Z79899 Other long term (current) drug therapy: Secondary | ICD-10-CM

## 2019-06-23 DIAGNOSIS — M461 Sacroiliitis, not elsewhere classified: Secondary | ICD-10-CM

## 2019-06-23 DIAGNOSIS — Z860100 Personal history of colon polyps, unspecified: Secondary | ICD-10-CM

## 2019-06-23 DIAGNOSIS — Z6841 Body Mass Index (BMI) 40.0 and over, adult: Secondary | ICD-10-CM

## 2019-06-23 NOTE — Progress Notes (Signed)
Medication Samples have been provided to the patient.  Drug name: Humira    Strength: 40 mg       Qty: 2 [id number]  Exp.Date: 09/2019  Dosing instructions: Inject 1 pen into skin every 14 days.   The patient has been instructed regarding the correct time, dose, and frequency of taking this medication, including desired effects and most common side effects.   Gwenlyn Perking 8:04 AM 06/23/2019

## 2019-06-23 NOTE — Patient Instructions (Signed)
Standing Labs We placed an order today for your standing lab work.    Please come back and get your standing labs in April and every 3 months   We have open lab daily Monday through Thursday from 8:30-12:30 PM and 1:30-4:30 PM and Friday from 8:30-12:30 PM and 1:30-4:00 PM at the office of Dr. Jahn Franchini.   You may experience shorter wait times on Monday and Friday afternoons. The office is located at [address], [address], [place], [place] [address] No appointment is necessary.   Labs are drawn by [facility].  You may receive a bill from [facility] for your lab work.  If you wish to have your labs drawn at another location, please call the office 24 hours in advance to send orders.  If you have any questions regarding directions or hours of operation,  please call [phone].   Just as a reminder please drink plenty of water prior to coming for your lab work. Thanks!   

## 2019-06-27 DIAGNOSIS — I10 Essential (primary) hypertension: Secondary | ICD-10-CM | POA: Diagnosis not present

## 2019-07-04 DIAGNOSIS — G4733 Obstructive sleep apnea (adult) (pediatric): Secondary | ICD-10-CM | POA: Diagnosis not present

## 2019-07-13 ENCOUNTER — Other Ambulatory Visit: Payer: Self-pay | Admitting: Rheumatology

## 2019-07-13 NOTE — Telephone Encounter (Addendum)
Last Visit: 06/23/19 Next Visit: 11/17/19 Labs: 05/25/19 CBC and CMP normal.  Current dose per last office note on 06/23/19: methotrexate 3 tablets weekly   Okay to refill per Dr. Estanislado Pandy

## 2019-07-27 DIAGNOSIS — I10 Essential (primary) hypertension: Secondary | ICD-10-CM | POA: Diagnosis not present

## 2019-08-04 DIAGNOSIS — G4733 Obstructive sleep apnea (adult) (pediatric): Secondary | ICD-10-CM | POA: Diagnosis not present

## 2019-08-11 DIAGNOSIS — Z23 Encounter for immunization: Secondary | ICD-10-CM | POA: Diagnosis not present

## 2019-08-27 DIAGNOSIS — I10 Essential (primary) hypertension: Secondary | ICD-10-CM | POA: Diagnosis not present

## 2019-08-30 ENCOUNTER — Telehealth: Payer: Self-pay | Admitting: Rheumatology

## 2019-08-30 DIAGNOSIS — Z79899 Other long term (current) drug therapy: Secondary | ICD-10-CM

## 2019-08-30 NOTE — Telephone Encounter (Signed)
Patient going to [facility] this week for draw. Please release orders. 

## 2019-08-30 NOTE — Telephone Encounter (Signed)
Lab orders released for [facility]. Advised patient.

## 2019-08-30 NOTE — Addendum Note (Signed)
Addended by: Earnestine Mealing on: 08/30/2019 02:46 PM   Modules accepted: Orders

## 2019-09-03 DIAGNOSIS — G4733 Obstructive sleep apnea (adult) (pediatric): Secondary | ICD-10-CM | POA: Diagnosis not present

## 2019-09-06 DIAGNOSIS — Z79899 Other long term (current) drug therapy: Secondary | ICD-10-CM | POA: Diagnosis not present

## 2019-09-09 LAB — CBC WITH DIFFERENTIAL/PLATELET
Basophils Absolute: 0.1 10*3/uL (ref 0.0–0.2)
Basos: 1 %
EOS (ABSOLUTE): 0.8 10*3/uL — ABNORMAL HIGH (ref 0.0–0.4)
Eos: 13 %
Hematocrit: 43.9 % (ref 37.5–51.0)
Hemoglobin: 14.3 g/dL (ref 13.0–17.7)
Immature Grans (Abs): 0 10*3/uL (ref 0.0–0.1)
Immature Granulocytes: 1 %
Lymphocytes Absolute: 1.5 10*3/uL (ref 0.7–3.1)
Lymphs: 23 %
MCH: 30 pg (ref 26.6–33.0)
MCHC: 32.6 g/dL (ref 31.5–35.7)
MCV: 92 fL (ref 79–97)
Monocytes Absolute: 0.9 10*3/uL (ref 0.1–0.9)
Monocytes: 13 %
Neutrophils Absolute: 3.1 10*3/uL (ref 1.4–7.0)
Neutrophils: 49 %
Platelets: 241 10*3/uL (ref 150–450)
RBC: 4.77 x10E6/uL (ref 4.14–5.80)
RDW: 12.6 % (ref 11.6–15.4)
WBC: 6.4 10*3/uL (ref 3.4–10.8)

## 2019-09-09 LAB — CMP14+EGFR
ALT: 23 IU/L (ref 0–44)
AST: 25 IU/L (ref 0–40)
Albumin/Globulin Ratio: 1.1 — ABNORMAL LOW (ref 1.2–2.2)
Albumin: 3.8 g/dL (ref 3.8–4.9)
Alkaline Phosphatase: 88 IU/L (ref 39–117)
BUN/Creatinine Ratio: 14 (ref 9–20)
BUN: 13 mg/dL (ref 6–24)
Bilirubin Total: 0.3 mg/dL (ref 0.0–1.2)
CO2: 23 mmol/L (ref 20–29)
Calcium: 9.4 mg/dL (ref 8.7–10.2)
Chloride: 106 mmol/L (ref 96–106)
Creatinine, Ser: 0.9 mg/dL (ref 0.76–1.27)
GFR calc Af Amer: 110 mL/min/{1.73_m2} (ref >59)
GFR calc non Af Amer: 95 mL/min/{1.73_m2} (ref >59)
Globulin, Total: 3.5 g/dL (ref 1.5–4.5)
Glucose: 96 mg/dL (ref 65–99)
Potassium: 5.4 mmol/L — ABNORMAL HIGH (ref 3.5–5.2)
Sodium: 141 mmol/L (ref 134–144)
Total Protein: 7.3 g/dL (ref 6.0–8.5)

## 2019-09-09 LAB — QUANTIFERON-TB GOLD PLUS
QuantiFERON Mitogen Value: >10 IU/mL
QuantiFERON Nil Value: 0 IU/mL
QuantiFERON TB1 Ag Value: 0 IU/mL
QuantiFERON TB2 Ag Value: 0 IU/mL
QuantiFERON-TB Gold Plus: NEGATIVE

## 2019-09-09 NOTE — Telephone Encounter (Signed)
CBC and CMP are normal.  Potassium is slightly elevated.  TB gold is negative.  Please forward labs to his PCP.

## 2019-09-22 DIAGNOSIS — R5383 Other fatigue: Secondary | ICD-10-CM | POA: Diagnosis not present

## 2019-09-22 DIAGNOSIS — Z1211 Encounter for screening for malignant neoplasm of colon: Secondary | ICD-10-CM | POA: Diagnosis not present

## 2019-09-22 DIAGNOSIS — Z7189 Other specified counseling: Secondary | ICD-10-CM | POA: Diagnosis not present

## 2019-09-22 DIAGNOSIS — Z0000 Encounter for general adult medical examination without abnormal findings: Secondary | ICD-10-CM | POA: Diagnosis not present

## 2019-09-22 DIAGNOSIS — M069 Rheumatoid arthritis, unspecified: Secondary | ICD-10-CM | POA: Diagnosis not present

## 2019-09-22 DIAGNOSIS — I10 Essential (primary) hypertension: Secondary | ICD-10-CM | POA: Diagnosis not present

## 2019-09-22 DIAGNOSIS — Z299 Encounter for prophylactic measures, unspecified: Secondary | ICD-10-CM | POA: Diagnosis not present

## 2019-09-24 ENCOUNTER — Other Ambulatory Visit: Payer: Self-pay | Admitting: Rheumatology

## 2019-09-24 NOTE — Telephone Encounter (Signed)
Last Visit: 06/23/19 Next Visit: 11/17/19 Labs: 09/06/2019 CBC and CMP are normal. Potassium is slightly elevated.   Current dose per last office note on 06/23/19: methotrexate 3 tablets weekly   Okay to refill per Dr. Estanislado Pandy

## 2019-09-26 DIAGNOSIS — I10 Essential (primary) hypertension: Secondary | ICD-10-CM | POA: Diagnosis not present

## 2019-09-30 DIAGNOSIS — R5383 Other fatigue: Secondary | ICD-10-CM | POA: Diagnosis not present

## 2019-09-30 DIAGNOSIS — E7800 Pure hypercholesterolemia, unspecified: Secondary | ICD-10-CM | POA: Diagnosis not present

## 2019-10-04 ENCOUNTER — Telehealth: Payer: Self-pay | Admitting: Rheumatology

## 2019-10-04 DIAGNOSIS — G4733 Obstructive sleep apnea (adult) (pediatric): Secondary | ICD-10-CM | POA: Diagnosis not present

## 2019-10-04 MED ORDER — HUMIRA (2 PEN) 40 MG/0.4ML ~~LOC~~ AJKT: 40.0000 mg | AUTO-INJECTOR | SUBCUTANEOUS | 0 refills | Status: DC

## 2019-10-04 NOTE — Telephone Encounter (Signed)
Last Visit:06/23/19 Next Visit:11/17/19 Labs:09/06/2019 CBC and CMP are normal. Potassium is slightly elevated.  TB Gold: 09/06/2019 Neg   Current dose per last office note on 06/23/19: Humira 40 mg sq injections every 14 days  Okay to refill per Dr. Estanislado Pandy   Patient advised prescription has been sent to the pharmacy. Patient advised we did not received a refill request for the Humira. Patient advised if he does not receive his shipment of medication before his next dose is due, then he is to call the office for a sample.

## 2019-10-04 NOTE — Telephone Encounter (Signed)
Patient called requesting prescription refill of Humira to be sent to [facility].  Patient states Dina Rich has sent "a couple of faxes requesting a refill" but has not received a response.  Patient states Dina Rich requested that the office contacts them by phone for a quicker response.  Patient states he took his last injection today.

## 2019-10-05 DIAGNOSIS — G4733 Obstructive sleep apnea (adult) (pediatric): Secondary | ICD-10-CM | POA: Diagnosis not present

## 2019-10-27 DIAGNOSIS — I10 Essential (primary) hypertension: Secondary | ICD-10-CM | POA: Diagnosis not present

## 2019-10-27 DIAGNOSIS — M069 Rheumatoid arthritis, unspecified: Secondary | ICD-10-CM | POA: Diagnosis not present

## 2019-11-03 DIAGNOSIS — G4733 Obstructive sleep apnea (adult) (pediatric): Secondary | ICD-10-CM | POA: Diagnosis not present

## 2019-11-03 NOTE — Progress Notes (Deleted)
Office Visit Note  Patient: Charles Jenkins             Date of Birth: [birth year]           MRN: [id number]             PCP: Glenda Chroman, MD Referring: Glenda Chroman, MD Visit Date: 11/17/2019 Occupation: @GUAROCC @  Subjective:  No chief complaint on file.   History of Present Illness: Charles Jenkins is a 59 y.o. male ***   Activities of Daily Living:  Patient reports morning stiffness for *** {minute/hour:19697}.   Patient {ACTIONS;DENIES/REPORTS:21021675::"Denies"} nocturnal pain.  Difficulty dressing/grooming: {ACTIONS;DENIES/REPORTS:21021675::"Denies"} Difficulty climbing stairs: {ACTIONS;DENIES/REPORTS:21021675::"Denies"} Difficulty getting out of chair: {ACTIONS;DENIES/REPORTS:21021675::"Denies"} Difficulty using hands for taps, buttons, cutlery, and/or writing: {ACTIONS;DENIES/REPORTS:21021675::"Denies"}  No Rheumatology ROS completed.   PMFS History:  Patient Active Problem List   Diagnosis Date Noted  . Other meniscus derangements, posterior horn of medial meniscus, left knee 01/08/2017  . Family history of scleroderma 09/25/2016  . Obesity due to excess calories 09/25/2016  . Primary osteoarthritis of both hands 04/10/2016  . Primary osteoarthritis of both feet 04/10/2016  . High risk medication use 04/09/2016  . 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 Lumbar spinal stenosis 05/05/2014  . 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 Insomnia 03/10/2013  . Spinal stenosis of lumbar region 10/28/2012  . Sciatica 09/23/2012  . Essential hypertension, benign 07/24/2012  . Sacroiliitis ([place]) 07/24/2012  . Depression 07/24/2012    Past Medical History:  Diagnosis Date  . Ankylosing spondylitis ([place])   . Arthritis   . Back pain   . Chronic right SI joint pain   . Hypertension    "only in doctor's offices"    Family History  Problem Relation Age of Onset  . Scleroderma Mother    Past Surgical  History:  Procedure Laterality Date  . KNEE ARTHROPLASTY Left 2018  . LUMBAR LAMINECTOMY/DECOMPRESSION MICRODISCECTOMY Left 10/28/2012   Procedure: MICRO LUMBAR DECOMPRESSION L4 - L5 and L5 - S1 2 LEVELS;  Surgeon: Johnn Hai, MD;  Location: [facility];  Service: Orthopedics;  Laterality: Left;  . LUMBAR LAMINECTOMY/DECOMPRESSION MICRODISCECTOMY N/A 05/04/2014   Procedure: MICRO LUMBAR DECOMPRESSION L2-L3, L3-L4;  Surgeon: Johnn Hai, MD;  Location: [facility];  Service: Orthopedics;  Laterality: N/A;   Social History   Social History Narrative  . Not on file   Immunization History  Administered Date(s) Administered  . Influenza,inj,quad, With Preservative 01/30/2017, 02/10/2018  . Influenza-Unspecified 02/27/2012, 01/27/2013, 02/08/2014  . Pneumococcal-Unspecified 02/27/2012     Objective: Vital Signs: There were no vitals taken for this visit.   Physical Exam   Musculoskeletal Exam: ***  CDAI Exam: CDAI Score: -- Patient Global: --; Provider Global: -- Swollen: --; Tender: -- Joint Exam 11/17/2019   No joint exam has been documented for this visit   There is currently no information documented on the homunculus. Go to the Rheumatology activity and complete the homunculus joint exam.  Investigation: No additional findings.  Imaging: No results found.  Recent Labs: Lab Results  Component Value Date   WBC 6.4 09/06/2019   HGB 14.3 09/06/2019   PLT 241 09/06/2019   NA 141 09/06/2019   K 5.4 (H) 09/06/2019   CL 106 09/06/2019   CO2 23 09/06/2019   GLUCOSE 96 09/06/2019   BUN 13 09/06/2019   CREATININE 0.90 09/06/2019   BILITOT 0.3 09/06/2019   ALKPHOS 88 09/06/2019   AST 25 09/06/2019  ALT 23 09/06/2019   PROT 7.3 09/06/2019   ALBUMIN 3.8 09/06/2019   CALCIUM 9.4 09/06/2019   GFRAA 110 09/06/2019   QFTBGOLDPLUS Negative 09/06/2019    Speciality Comments: Approved for Humira PAP through [facility] through 04/29/2019.  Procedures:  No procedures  performed Allergies: Patient has no known allergies.   Assessment / Plan:     Visit Diagnoses: Spondyloarthropathy  High risk medication use  Primary osteoarthritis of both hands  Trigger index finger of left hand  Trigger thumb of both hands  Primary osteoarthritis of both feet  Sacroiliitis (HCC)  S/P arthroscopic surgery of left knee  DDD (degenerative disc disease), lumbar  Primary insomnia  History of hypertension  History of colon polyps  BMI 45.0-49.9, adult (HCC)  Family history of scleroderma  Hypergammaglobulinemia  History of diabetes mellitus  Essential hypertension, benign  Orders: No orders of the defined types were placed in this encounter.  No orders of the defined types were placed in this encounter.   Face-to-face time spent with patient was *** minutes. Greater than 50% of time was spent in counseling and coordination of care.  Follow-Up Instructions: No follow-ups on file.   Ofilia Neas, PA-C  Note - This record has been created using Dragon software.  Chart creation errors have been sought, but may not always  have been located. Such creation errors do not reflect on  the standard of medical care.

## 2019-11-17 ENCOUNTER — Ambulatory Visit: Payer: PRIVATE HEALTH INSURANCE | Admitting: Physician Assistant

## 2019-11-24 NOTE — Progress Notes (Signed)
Office Visit Note  Patient: Charles Jenkins             Date of Birth: 09-20-1960           MRN: [id number]             PCP: Glenda Chroman, MD Referring: Glenda Chroman, MD Visit Date: 12/08/2019 Occupation: @GUAROCC @  Subjective:  Other (left thumb pain, patient has noticed improvement in joint pain since losing weight- patient has lost 20 lbs intenionally. )   History of Present Illness: Charles Jenkins is a 59 y.o. male with history of ankylosing spondylitis.  He states he has been doing very well on combination of Humira and methotrexate.  He has some stiffness in his joint but no joint swelling.  His SI joint pain is improved also.  He has tried some intentional weight loss and has been successful.  He has been going to the gym on a regular basis.  He is also done some dietary modifications.  Activities of Daily Living:  Patient reports morning stiffness for 15  minutes.   Patient Denies nocturnal pain.  Difficulty dressing/grooming: Denies Difficulty climbing stairs: Reports Difficulty getting out of chair: Reports Difficulty using hands for taps, buttons, cutlery, and/or writing: Reports  Review of Systems  Constitutional: Negative for fatigue.  HENT: Negative for mouth sores, mouth dryness and nose dryness.   Eyes: Negative for itching and dryness.  Respiratory: Negative for shortness of breath and difficulty breathing.   Cardiovascular: Negative for chest pain and palpitations.  Gastrointestinal: Negative for blood in stool, constipation and diarrhea.  Endocrine: Negative for increased urination.  Genitourinary: Negative for difficulty urinating.  Musculoskeletal: Positive for arthralgias, joint pain and morning stiffness. Negative for joint swelling, myalgias, muscle tenderness and myalgias.  Skin: Negative for color change, rash and redness.  Allergic/Immunologic: Negative for susceptible to infections.  Neurological: Negative for dizziness, numbness, headaches,  memory loss and weakness.  Hematological: Positive for bruising/bleeding tendency.  Psychiatric/Behavioral: Negative for confusion and sleep disturbance.    PMFS History:  Patient Active Problem List   Diagnosis Date Noted  . Other meniscus derangements, posterior horn of medial meniscus, left knee 01/08/2017  . Family history of scleroderma 09/25/2016  . Obesity due to excess calories 09/25/2016  . Primary osteoarthritis of both hands 04/10/2016  . Primary osteoarthritis of both feet 04/10/2016  . High risk medication use 04/09/2016  . 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 Lumbar spinal stenosis 05/05/2014  . 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 Insomnia 03/10/2013  . Spinal stenosis of lumbar region 10/28/2012  . Sciatica 09/23/2012  . Essential hypertension, benign 07/24/2012  . Sacroiliitis ([place]) 07/24/2012  . Depression 07/24/2012    Past Medical History:  Diagnosis Date  . Ankylosing spondylitis ([place])   . Arthritis   . Back pain   . Chronic right SI joint pain   . Hypertension    "only in doctor's offices"  . Sleep apnea    per patient     Family History  Problem Relation Age of Onset  . Scleroderma Mother    Past Surgical History:  Procedure Laterality Date  . KNEE ARTHROPLASTY Left 2018  . LUMBAR LAMINECTOMY/DECOMPRESSION MICRODISCECTOMY Left 10/28/2012   Procedure: MICRO LUMBAR DECOMPRESSION L4 - L5 and L5 - S1 2 LEVELS;  Surgeon: Johnn Hai, MD;  Location: [facility];  Service: Orthopedics;  Laterality: Left;  . LUMBAR LAMINECTOMY/DECOMPRESSION MICRODISCECTOMY N/A 05/04/2014  Procedure: MICRO LUMBAR DECOMPRESSION L2-L3, L3-L4;  Surgeon: Johnn Hai, MD;  Location: [facility];  Service: Orthopedics;  Laterality: N/A;   Social History   Social History Narrative  . Not on file   Immunization History  Administered Date(s) Administered  . Influenza,inj,quad, With Preservative 01/30/2017, 02/10/2018   . Influenza-Unspecified 02/27/2012, 01/27/2013, 02/08/2014  . Pneumococcal-Unspecified 02/27/2012     Objective: Vital Signs: BP 122/67 (BP Location: Left Arm, Patient Position: Sitting, Cuff Size: Large)   Pulse (!) 59   Resp 17   Ht 6' (1.829 m)   Wt (!) 339 lb 9.6 oz (154 kg)   BMI 46.06 kg/m    Physical Exam Vitals and nursing note reviewed.  Constitutional:      Appearance: He is well-developed.  HENT:     Head: Normocephalic and atraumatic.  Eyes:     Conjunctiva/sclera: Conjunctivae normal.     Pupils: Pupils are equal, round, and reactive to light.  Cardiovascular:     Rate and Rhythm: Normal rate and regular rhythm.     Heart sounds: Normal heart sounds.  Pulmonary:     Effort: Pulmonary effort is normal.     Breath sounds: Normal breath sounds.  Abdominal:     General: Bowel sounds are normal.     Palpations: Abdomen is soft.  Musculoskeletal:     Cervical back: Normal range of motion and neck supple.  Skin:    General: Skin is warm and dry.     Capillary Refill: Capillary refill takes less than 2 seconds.  Neurological:     Mental Status: He is alert and oriented to person, place, and time.  Psychiatric:        Behavior: Behavior normal.      Musculoskeletal Exam: C-spine was in good range of motion.  He has limited range of motion of his lumbar spine.  He had no SI joint tenderness.  Shoulder joints, elbow joints, wrist joints with good range of motion.  He has PIP and DIP thickening with no synovitis.  His left trigger thumb.  Hip joints and knee joints in good range of motion.  There was no evidence of Achilles tendinitis or plantar fasciitis.  CDAI Exam: CDAI Score: -- Patient Global: --; Provider Global: -- Swollen: --; Tender: -- Joint Exam 12/08/2019   No joint exam has been documented for this visit   There is currently no information documented on the homunculus. Go to the Rheumatology activity and complete the homunculus joint  exam.  Investigation: No additional findings.  Imaging: No results found.  Recent Labs: Lab Results  Component Value Date   WBC 6.4 09/06/2019   HGB 14.3 09/06/2019   PLT 241 09/06/2019   NA 141 09/06/2019   K 5.4 (H) 09/06/2019   CL 106 09/06/2019   CO2 23 09/06/2019   GLUCOSE 96 09/06/2019   BUN 13 09/06/2019   CREATININE 0.90 09/06/2019   BILITOT 0.3 09/06/2019   ALKPHOS 88 09/06/2019   AST 25 09/06/2019   ALT 23 09/06/2019   PROT 7.3 09/06/2019   ALBUMIN 3.8 09/06/2019   CALCIUM 9.4 09/06/2019   GFRAA 110 09/06/2019   QFTBGOLDPLUS Negative 09/06/2019    Speciality Comments: Approved for Humira PAP through [facility] through 04/29/2019.  Procedures:  No procedures performed Allergies: Patient has no known allergies.   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  He denies any SI joint pain.  He has had  no joint swelling.  High risk medication use - Humira 40 mg sq injections every 14 days, methotrexate 3 tablets weekly and folic acid 1 mg daily.  His labs have been stable.  We will check labs today and then every 3 months to monitor for drug toxicity.  TB gold was negative in May 2021.  Primary osteoarthritis of both hands-he has some the underlying osteoarthritis which causes a stiffness.  Primary osteoarthritis of both feet-proper fitting shoes were discussed.  Sacroiliitis (HCC)-he denies any SI joint pain currently.  Trigger thumb of both hands - resolved after injections.  He had been doing much better but recently his left thumb has been triggering off and on.  He wants to hold off injections at this point.  S/P arthroscopic surgery of left knee  DDD (degenerative disc disease), lumbar-he has chronic stiffness and discomfort in his lower back.  Other medical problems are listed as follows:  Primary insomnia-good sleep hygiene was discussed.  History of colon polyps  History of  hypertension-his blood pressure is better controlled.  BMI 45.0-49.9, adult (HCC)-he had intentional weight loss.  He has been exercising on a regular basis.  Dietary modifications were discussed.  Family history of scleroderma  History of sleep apnea - per patient, patient is on CPAP  He is fully vaccinated against COVID-19.  Use of mask, social distancing and hand hygiene was emphasized.  If he develops COVID-19 infection he may be a candidate for monoclonal antibody infusion.  Holding methotrexate after the booster dose was also discussed.  Orders: Orders Placed This Encounter  Procedures  . CBC with Differential/Platelet  . COMPLETE METABOLIC PANEL WITH GFR   No orders of the defined types were placed in this encounter.    Follow-Up Instructions: Return in about 5 months (around 05/09/2020) for Spondyloarthropathy.   Bo Merino, MD  Note - This record has been created using Editor, commissioning.  Chart creation errors have been sought, but may not always  have been located. Such creation errors do not reflect on  the standard of medical care.

## 2019-11-26 DIAGNOSIS — I10 Essential (primary) hypertension: Secondary | ICD-10-CM | POA: Diagnosis not present

## 2019-12-04 DIAGNOSIS — G4733 Obstructive sleep apnea (adult) (pediatric): Secondary | ICD-10-CM | POA: Diagnosis not present

## 2019-12-08 ENCOUNTER — Ambulatory Visit (INDEPENDENT_AMBULATORY_CARE_PROVIDER_SITE_OTHER): Payer: Medicare Other | Admitting: Rheumatology

## 2019-12-08 ENCOUNTER — Other Ambulatory Visit: Payer: Self-pay

## 2019-12-08 ENCOUNTER — Encounter: Payer: Self-pay | Admitting: Rheumatology

## 2019-12-08 VITALS — BP 122/67 | HR 59 | Resp 17 | Ht 72.0 in | Wt 339.6 lb

## 2019-12-08 DIAGNOSIS — Z8669 Personal history of other diseases of the nervous system and sense organs: Secondary | ICD-10-CM

## 2019-12-08 DIAGNOSIS — M19071 Primary osteoarthritis, right ankle and foot: Secondary | ICD-10-CM | POA: Diagnosis not present

## 2019-12-08 DIAGNOSIS — M65312 Trigger thumb, left thumb: Secondary | ICD-10-CM

## 2019-12-08 DIAGNOSIS — M19042 Primary osteoarthritis, left hand: Secondary | ICD-10-CM

## 2019-12-08 DIAGNOSIS — M47819 Spondylosis without myelopathy or radiculopathy, site unspecified: Secondary | ICD-10-CM | POA: Diagnosis not present

## 2019-12-08 DIAGNOSIS — Z9889 Other specified postprocedural states: Secondary | ICD-10-CM

## 2019-12-08 DIAGNOSIS — Z8269 Family history of other diseases of the musculoskeletal system and connective tissue: Secondary | ICD-10-CM

## 2019-12-08 DIAGNOSIS — Z86010 Personal history of colonic polyps: Secondary | ICD-10-CM

## 2019-12-08 DIAGNOSIS — M19041 Primary osteoarthritis, right hand: Secondary | ICD-10-CM

## 2019-12-08 DIAGNOSIS — M461 Sacroiliitis, not elsewhere classified: Secondary | ICD-10-CM | POA: Diagnosis not present

## 2019-12-08 DIAGNOSIS — Z79899 Other long term (current) drug therapy: Secondary | ICD-10-CM | POA: Diagnosis not present

## 2019-12-08 DIAGNOSIS — M65311 Trigger thumb, right thumb: Secondary | ICD-10-CM

## 2019-12-08 DIAGNOSIS — M19072 Primary osteoarthritis, left ankle and foot: Secondary | ICD-10-CM

## 2019-12-08 DIAGNOSIS — Z8679 Personal history of other diseases of the circulatory system: Secondary | ICD-10-CM

## 2019-12-08 DIAGNOSIS — F5101 Primary insomnia: Secondary | ICD-10-CM

## 2019-12-08 DIAGNOSIS — M5136 Other intervertebral disc degeneration, lumbar region: Secondary | ICD-10-CM

## 2019-12-08 DIAGNOSIS — Z6841 Body Mass Index (BMI) 40.0 and over, adult: Secondary | ICD-10-CM

## 2019-12-08 NOTE — Patient Instructions (Signed)
Standing Labs We placed an order today for your standing lab work.   Please have your standing labs drawn in November and every 3 months  If possible, please have your labs drawn 2 weeks prior to your appointment so that the provider can discuss your results at your appointment.  We have open lab daily Monday through Thursday from 8:30-12:30 PM and 1:30-4:30 PM and Friday from 8:30-12:30 PM and 1:30-4:00 PM at the office of Dr. Bo Merino, [facility].   Please be advised, patients with office appointments requiring lab work will take precedents over walk-in lab work.  If possible, please come for your lab work on Monday and Friday afternoons, as you may experience shorter wait times. The office is located at [address], [place], [place], [place] [address] No appointment is necessary.   Labs are drawn by [facility]. Please bring your co-pay at the time of your lab draw.  You may receive a bill from [facility] for your lab work.  If you wish to have your labs drawn at another location, please call the office 24 hours in advance to send orders.  If you have any questions regarding directions or hours of operation,  please call [phone].   As a reminder, please drink plenty of water prior to coming for your lab work. Thanks!   COVID-19 vaccine recommendations:   COVID-19 vaccine is recommended for everyone (unless you are allergic to a vaccine component), even if you are on a medication that suppresses your immune system.   If you are on Methotrexate, Cellcept (mycophenolate), Rinvoq, Morrie Sheldon, and Olumiant- hold the medication for 1 week after each vaccine. Hold Methotrexate for 2 weeks after the single dose COVID-19 vaccine.   If you are on Orencia subcutaneous injection - hold medication one week prior to and one week after the first COVID-19 vaccine dose (only).   If you are on Orencia IV infusions- time vaccination administration so that the first COVID-19  vaccination will occur four weeks after the infusion and postpone the subsequent infusion by one week.   If you are on Cyclophosphamide or Rituxan infusions please contact your doctor prior to receiving the COVID-19 vaccine.   Do not take Tylenol or ant anti-inflammatory medications (NSAIDs) 24 hours prior to the COVID-19 vaccination.   There is no direct evidence about the efficacy of the COVID-19 vaccine in individuals who are on medications that suppress the immune system.   Even if you are fully vaccinated, and you are on any medications that suppress your immune system, please continue to wear a mask, maintain at least six feet social distance and practice hand hygiene.   If you develop a COVID-19 infection, please contact your PCP or our office to determine if you need antibody infusion.  We anticipate that a booster vaccine will be available soon for immunosuppressed individuals. Please cal our office before receiving your booster dose to make adjustments to your medication regimen.   [email or web address]

## 2019-12-09 LAB — CBC WITH DIFFERENTIAL/PLATELET
Absolute Monocytes: 799 cells/uL (ref 200–950)
Basophils Absolute: 72 cells/uL (ref 0–200)
Basophils Relative: 1 %
Eosinophils Absolute: 526 cells/uL — ABNORMAL HIGH (ref 15–500)
Eosinophils Relative: 7.3 %
HCT: 43.7 % (ref 38.5–50.0)
Hemoglobin: 14.3 g/dL (ref 13.2–17.1)
Lymphs Abs: 1742 cells/uL (ref 850–3900)
MCH: 30.1 pg (ref 27.0–33.0)
MCHC: 32.7 g/dL (ref 32.0–36.0)
MCV: 92 fL (ref 80.0–100.0)
MPV: 11.9 fL (ref 7.5–12.5)
Monocytes Relative: 11.1 %
Neutro Abs: 4061 cells/uL (ref 1500–7800)
Neutrophils Relative %: 56.4 %
Platelets: 255 10*3/uL (ref 140–400)
RBC: 4.75 10*6/uL (ref 4.20–5.80)
RDW: 12.8 % (ref 11.0–15.0)
Total Lymphocyte: 24.2 %
WBC: 7.2 10*3/uL (ref 3.8–10.8)

## 2019-12-09 LAB — COMPLETE METABOLIC PANEL WITH GFR
AG Ratio: 1.1 (calc) (ref 1.0–2.5)
ALT: 24 U/L (ref 9–46)
AST: 24 U/L (ref 10–35)
Albumin: 4.1 g/dL (ref 3.6–5.1)
Alkaline phosphatase (APISO): 78 U/L (ref 35–144)
BUN: 19 mg/dL (ref 7–25)
CO2: 27 mmol/L (ref 20–32)
Calcium: 10.1 mg/dL (ref 8.6–10.3)
Chloride: 104 mmol/L (ref 98–110)
Creat: 1.04 mg/dL (ref 0.70–1.33)
GFR, Est African American: 93 mL/min/{1.73_m2} (ref >=60)
GFR, Est Non African American: 80 mL/min/{1.73_m2} (ref >=60)
Globulin: 3.6 g/dL (calc) (ref 1.9–3.7)
Glucose, Bld: 106 mg/dL — ABNORMAL HIGH (ref 65–99)
Potassium: 5.2 mmol/L (ref 3.5–5.3)
Sodium: 139 mmol/L (ref 135–146)
Total Bilirubin: 0.5 mg/dL (ref 0.2–1.2)
Total Protein: 7.7 g/dL (ref 6.1–8.1)

## 2019-12-09 NOTE — Progress Notes (Signed)
CBC and CMP are stable.

## 2019-12-14 ENCOUNTER — Other Ambulatory Visit: Payer: Self-pay | Admitting: Rheumatology

## 2019-12-14 NOTE — Telephone Encounter (Signed)
Last Visit: 12/08/2019 Next Visit: 05/10/2020 Labs: 12/08/2019 CBC and CMP are stable.  Current Dose per office note 12/08/2019: methotrexate 3 tablets weekly  DX:  Spondyloarthropathy  Okay to refill per Dr. Estanislado Pandy

## 2019-12-15 ENCOUNTER — Telehealth: Payer: Self-pay | Admitting: Rheumatology

## 2019-12-15 NOTE — Telephone Encounter (Signed)
Patient called stating the pharmacist at [facility] told him he could get the booster vaccine tomorrow 12/16/19.  Patient is requesting a return call to let him know if it is okay.

## 2019-12-15 NOTE — Telephone Encounter (Signed)
Reviewed the following with patient.   COVID-19 vaccine recommendations:   COVID-19 vaccine is recommended for everyone (unless you are allergic to a vaccine component), even if you are on a medication that suppresses your immune system.   If you are on Methotrexate, hold the medication for 1 week after each vaccine.   Do not take Tylenol or any anti-inflammatory medications (NSAIDs) 24 hours prior to the COVID-19 vaccination.   There is no direct evidence about the efficacy of the COVID-19 vaccine in individuals who are on medications that suppress the immune system.   Even if you are fully vaccinated, and you are on any medications that suppress your immune system, please continue to wear a mask, maintain at least six feet social distance and practice hand hygiene.   If you develop a COVID-19 infection, please contact your PCP or our office to determine if you need antibody infusion.  The booster vaccine is now available for immunocompromised patients. It is advised that if you had Pfizer vaccine you should get [employer] booster.  If you had a Moderna vaccine then you should get a Moderna booster. Johnson and Wynetta Emery does not have a booster vaccine at this time.  Please see the following web sites for updated information.   [email or web address]-Patient-[email or web address]  [email or web address]://[email or web address]/About-[email or web address]/[email or web address]-Releases/ID/1159

## 2019-12-28 ENCOUNTER — Telehealth: Payer: Self-pay | Admitting: Rheumatology

## 2019-12-28 DIAGNOSIS — M069 Rheumatoid arthritis, unspecified: Secondary | ICD-10-CM | POA: Diagnosis not present

## 2019-12-28 DIAGNOSIS — I10 Essential (primary) hypertension: Secondary | ICD-10-CM | POA: Diagnosis not present

## 2019-12-28 MED ORDER — HUMIRA (2 PEN) 40 MG/0.4ML ~~LOC~~ AJKT: 40.0000 mg | AUTO-INJECTOR | SUBCUTANEOUS | 0 refills | Status: DC

## 2019-12-28 NOTE — Telephone Encounter (Signed)
Last Visit: 12/08/2019 Next Visit: 05/10/2020 Labs: 12/08/2019 CBC and CMP are stable. TB Gold: 09/06/2019 Neg  Current Dose per office note on 12/08/2019: Humira 40 mg sq injections every 14 days  Okay to refill per Dr. Estanislado Pandy   Patient advised prescription has been sent to the pharmacy.

## 2019-12-28 NOTE — Telephone Encounter (Signed)
Patient called stating he called [facility] for prescription refill of Humira, but was told there is no prescription on file from Dr. Estanislado Pandy.  Patient was told he needed to contact the office.  Patient states he took his last injection this morning 12/28/19.

## 2019-12-30 DIAGNOSIS — M069 Rheumatoid arthritis, unspecified: Secondary | ICD-10-CM | POA: Diagnosis not present

## 2019-12-30 DIAGNOSIS — I10 Essential (primary) hypertension: Secondary | ICD-10-CM | POA: Diagnosis not present

## 2019-12-30 DIAGNOSIS — D849 Immunodeficiency, unspecified: Secondary | ICD-10-CM | POA: Diagnosis not present

## 2019-12-30 DIAGNOSIS — Z299 Encounter for prophylactic measures, unspecified: Secondary | ICD-10-CM | POA: Diagnosis not present

## 2019-12-30 DIAGNOSIS — M545 Low back pain: Secondary | ICD-10-CM | POA: Diagnosis not present

## 2020-01-04 ENCOUNTER — Telehealth: Payer: Self-pay | Admitting: Rheumatology

## 2020-01-04 DIAGNOSIS — G4733 Obstructive sleep apnea (adult) (pediatric): Secondary | ICD-10-CM | POA: Diagnosis not present

## 2020-01-04 NOTE — Telephone Encounter (Signed)
Patient called stating the earliest delivery date for his Humira is 01/13/20 and he is due to take his injection on 01/10/20.  Patient is requesting a return call to let him know if he can pick up a sample.

## 2020-01-04 NOTE — Telephone Encounter (Signed)
Patient advised he may come by the office to pick up a sample of Humira.

## 2020-01-05 NOTE — Telephone Encounter (Signed)
Medication Samples have been provided to the patient.  Drug name: Humira      Strength: 40 mg      Qty: 1 LOT: [id number]  Exp.Date: 09/2020  Dosing instructions: Inject one pen into skin every 14 days.   The patient has been instructed regarding the correct time, dose, and frequency of taking this medication, including desired effects and most common side effects.   Gwenlyn Perking 3:11 PM 01/05/2020

## 2020-01-27 DIAGNOSIS — I10 Essential (primary) hypertension: Secondary | ICD-10-CM | POA: Diagnosis not present

## 2020-02-03 DIAGNOSIS — G4733 Obstructive sleep apnea (adult) (pediatric): Secondary | ICD-10-CM | POA: Diagnosis not present

## 2020-02-26 DIAGNOSIS — I10 Essential (primary) hypertension: Secondary | ICD-10-CM | POA: Diagnosis not present

## 2020-03-01 ENCOUNTER — Other Ambulatory Visit: Payer: Self-pay | Admitting: Rheumatology

## 2020-03-01 NOTE — Telephone Encounter (Signed)
Last Visit: 12/08/2019 Next Visit: 05/10/2020  Okay to refill per Dr. Estanislado Pandy

## 2020-03-05 DIAGNOSIS — G4733 Obstructive sleep apnea (adult) (pediatric): Secondary | ICD-10-CM | POA: Diagnosis not present

## 2020-03-20 ENCOUNTER — Telehealth: Payer: Self-pay

## 2020-03-20 DIAGNOSIS — Z79899 Other long term (current) drug therapy: Secondary | ICD-10-CM

## 2020-03-20 NOTE — Telephone Encounter (Signed)
Lab Orders released.  

## 2020-03-20 NOTE — Telephone Encounter (Signed)
Patient called requesting his labwork orders be sent to [facility] in [place].  Patient states he will be going on Friday, 03/24/20.

## 2020-03-21 ENCOUNTER — Telehealth: Payer: Self-pay | Admitting: Pharmacist

## 2020-03-21 NOTE — Telephone Encounter (Signed)
Received a fax from  [place] regarding a re-approval for [place] patient assistance through 04/28/21. Will send documentation to scan center.  Phone number: [phone]  Knox Saliva, PharmD, MPH Clinical Pharmacist (Rheumatology and Pulmonology)

## 2020-03-22 NOTE — Telephone Encounter (Signed)
Patient will need refill on Humira once labs result.

## 2020-03-24 ENCOUNTER — Other Ambulatory Visit: Payer: Self-pay | Admitting: Rheumatology

## 2020-03-24 DIAGNOSIS — Z79899 Other long term (current) drug therapy: Secondary | ICD-10-CM | POA: Diagnosis not present

## 2020-03-25 LAB — CBC WITH DIFFERENTIAL/PLATELET
Basophils Absolute: 0.1 10*3/uL (ref 0.0–0.2)
Basos: 1 %
EOS (ABSOLUTE): 0.6 10*3/uL — ABNORMAL HIGH (ref 0.0–0.4)
Eos: 6 %
Hematocrit: 43 % (ref 37.5–51.0)
Hemoglobin: 14.3 g/dL (ref 13.0–17.7)
Immature Grans (Abs): 0.1 10*3/uL (ref 0.0–0.1)
Immature Granulocytes: 1 %
Lymphocytes Absolute: 1.7 10*3/uL (ref 0.7–3.1)
Lymphs: 18 %
MCH: 31.2 pg (ref 26.6–33.0)
MCHC: 33.3 g/dL (ref 31.5–35.7)
MCV: 94 fL (ref 79–97)
Monocytes Absolute: 1.3 10*3/uL — ABNORMAL HIGH (ref 0.1–0.9)
Monocytes: 14 %
Neutrophils Absolute: 5.5 10*3/uL (ref 1.4–7.0)
Neutrophils: 60 %
Platelets: 285 10*3/uL (ref 150–450)
RBC: 4.58 x10E6/uL (ref 4.14–5.80)
RDW: 12.7 % (ref 11.6–15.4)
WBC: 9.2 10*3/uL (ref 3.4–10.8)

## 2020-03-25 LAB — CMP14+EGFR
ALT: 13 IU/L (ref 0–44)
AST: 13 IU/L (ref 0–40)
Albumin/Globulin Ratio: 1.2 (ref 1.2–2.2)
Albumin: 3.9 g/dL (ref 3.8–4.9)
Alkaline Phosphatase: 94 IU/L (ref 44–121)
BUN/Creatinine Ratio: 13 (ref 9–20)
BUN: 12 mg/dL (ref 6–24)
Bilirubin Total: 0.3 mg/dL (ref 0.0–1.2)
CO2: 22 mmol/L (ref 20–29)
Calcium: 9.4 mg/dL (ref 8.7–10.2)
Chloride: 103 mmol/L (ref 96–106)
Creatinine, Ser: 0.95 mg/dL (ref 0.76–1.27)
GFR calc Af Amer: 102 mL/min/{1.73_m2} (ref >59)
GFR calc non Af Amer: 88 mL/min/{1.73_m2} (ref >59)
Globulin, Total: 3.2 g/dL (ref 1.5–4.5)
Glucose: 82 mg/dL (ref 65–99)
Potassium: 4.4 mmol/L (ref 3.5–5.2)
Sodium: 141 mmol/L (ref 134–144)
Total Protein: 7.1 g/dL (ref 6.0–8.5)

## 2020-03-27 MED ORDER — HUMIRA (2 PEN) 40 MG/0.4ML ~~LOC~~ AJKT: 40.0000 mg | AUTO-INJECTOR | SUBCUTANEOUS | 0 refills | Status: DC

## 2020-03-27 NOTE — Telephone Encounter (Signed)
Last Visit:12/08/2019 Next Visit:05/10/2020 Labs: 03/24/2020 CBC and CMP normal.  Current Dose per office note 12/08/2019: methotrexate 3 tablets weekly  DX: Spondyloarthropathy  Okay to refill per Dr. Estanislado Pandy

## 2020-03-27 NOTE — Addendum Note (Signed)
Addended by: Carole Binning on: 03/27/2020 11:30 AM   Modules accepted: Orders

## 2020-03-27 NOTE — Telephone Encounter (Signed)
Last Visit:12/08/2019 Next Visit:05/10/2020 Labs: 03/24/2020 CBC and CMP normal. Tb Gold: 09/06/2019 Neg   Current Dose per office note8/02/2020:Humira 40 mg sq injections every 14 days DE:[id number]  Okay to refill per Dr. Estanislado Pandy   Patient advised he is re approved for Humira patient assistance through end of 2022.

## 2020-03-27 NOTE — Telephone Encounter (Signed)
CBC and CMP normal

## 2020-03-28 DIAGNOSIS — I10 Essential (primary) hypertension: Secondary | ICD-10-CM | POA: Diagnosis not present

## 2020-03-28 DIAGNOSIS — K219 Gastro-esophageal reflux disease without esophagitis: Secondary | ICD-10-CM | POA: Diagnosis not present

## 2020-03-28 DIAGNOSIS — E7849 Other hyperlipidemia: Secondary | ICD-10-CM | POA: Diagnosis not present

## 2020-03-30 DIAGNOSIS — M069 Rheumatoid arthritis, unspecified: Secondary | ICD-10-CM | POA: Diagnosis not present

## 2020-03-30 DIAGNOSIS — Z713 Dietary counseling and surveillance: Secondary | ICD-10-CM | POA: Diagnosis not present

## 2020-03-30 DIAGNOSIS — Z299 Encounter for prophylactic measures, unspecified: Secondary | ICD-10-CM | POA: Diagnosis not present

## 2020-03-30 DIAGNOSIS — I10 Essential (primary) hypertension: Secondary | ICD-10-CM | POA: Diagnosis not present

## 2020-04-03 DIAGNOSIS — G4733 Obstructive sleep apnea (adult) (pediatric): Secondary | ICD-10-CM | POA: Diagnosis not present

## 2020-04-04 DIAGNOSIS — G4733 Obstructive sleep apnea (adult) (pediatric): Secondary | ICD-10-CM | POA: Diagnosis not present

## 2020-04-26 NOTE — Progress Notes (Deleted)
Office Visit Note  Patient: Charles Jenkins             Date of Birth: Jan 22, 1961           MRN: [id number]             PCP: Glenda Chroman, MD Referring: Glenda Chroman, MD Visit Date: 05/10/2020 Occupation: @GUAROCC @  Subjective:  No chief complaint on file.   History of Present Illness: Charles Jenkins is a 59 y.o. male ***   Activities of Daily Living:  Patient reports morning stiffness for *** {minute/hour:19697}.   Patient {ACTIONS;DENIES/REPORTS:21021675::"Denies"} nocturnal pain.  Difficulty dressing/grooming: {ACTIONS;DENIES/REPORTS:21021675::"Denies"} Difficulty climbing stairs: {ACTIONS;DENIES/REPORTS:21021675::"Denies"} Difficulty getting out of chair: {ACTIONS;DENIES/REPORTS:21021675::"Denies"} Difficulty using hands for taps, buttons, cutlery, and/or writing: {ACTIONS;DENIES/REPORTS:21021675::"Denies"}  No Rheumatology ROS completed.   PMFS History:  Patient Active Problem List   Diagnosis Date Noted  . Other meniscus derangements, posterior horn of medial meniscus, left knee 01/08/2017  . Family history of scleroderma 09/25/2016  . Obesity due to excess calories 09/25/2016  . Primary osteoarthritis of both hands 04/10/2016  . Primary osteoarthritis of both feet 04/10/2016  . High risk medication use 04/09/2016  . 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 Lumbar spinal stenosis 05/05/2014  . 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 Insomnia 03/10/2013  . Spinal stenosis of lumbar region 10/28/2012  . Sciatica 09/23/2012  . Essential hypertension, benign 07/24/2012  . Sacroiliitis ([place]) 07/24/2012  . Depression 07/24/2012    Past Medical History:  Diagnosis Date  . Ankylosing spondylitis ([place])   . Arthritis   . Back pain   . Chronic right SI joint pain   . Hypertension    "only in doctor's offices"  . Sleep apnea    per patient     Family History  Problem Relation Age of Onset  . Scleroderma  Mother    Past Surgical History:  Procedure Laterality Date  . KNEE ARTHROPLASTY Left 2018  . LUMBAR LAMINECTOMY/DECOMPRESSION MICRODISCECTOMY Left 10/28/2012   Procedure: MICRO LUMBAR DECOMPRESSION L4 - L5 and L5 - S1 2 LEVELS;  Surgeon: Johnn Hai, MD;  Location: [facility];  Service: Orthopedics;  Laterality: Left;  . LUMBAR LAMINECTOMY/DECOMPRESSION MICRODISCECTOMY N/A 05/04/2014   Procedure: MICRO LUMBAR DECOMPRESSION L2-L3, L3-L4;  Surgeon: Johnn Hai, MD;  Location: [facility];  Service: Orthopedics;  Laterality: N/A;   Social History   Social History Narrative  . Not on file   Immunization History  Administered Date(s) Administered  . Influenza,inj,quad, With Preservative 01/30/2017, 02/10/2018  . Influenza-Unspecified 02/27/2012, 01/27/2013, 02/08/2014  . Pneumococcal-Unspecified 02/27/2012     Objective: Vital Signs: There were no vitals taken for this visit.   Physical Exam   Musculoskeletal Exam: ***  CDAI Exam: CDAI Score: -- Patient Global: --; Provider Global: -- Swollen: --; Tender: -- Joint Exam 05/10/2020   No joint exam has been documented for this visit   There is currently no information documented on the homunculus. Go to the Rheumatology activity and complete the homunculus joint exam.  Investigation: No additional findings.  Imaging: No results found.  Recent Labs: Lab Results  Component Value Date   WBC 9.2 03/24/2020   HGB 14.3 03/24/2020   PLT 285 03/24/2020   NA 141 03/24/2020   K 4.4 03/24/2020   CL 103 03/24/2020   CO2 22 03/24/2020   GLUCOSE 82 03/24/2020   BUN 12 03/24/2020   CREATININE 0.95 03/24/2020   BILITOT 0.3 03/24/2020  ALKPHOS 94 03/24/2020   AST 13 03/24/2020   ALT 13 03/24/2020   PROT 7.1 03/24/2020   ALBUMIN 3.9 03/24/2020   CALCIUM 9.4 03/24/2020   GFRAA 102 03/24/2020   QFTBGOLDPLUS Negative 09/06/2019    Speciality Comments: Approved for Humira PAP through [facility] through  04/29/2019.  Procedures:  No procedures performed Allergies: Patient has no known allergies.   Assessment / Plan:     Visit Diagnoses: No diagnosis found.  Orders: No orders of the defined types were placed in this encounter.  No orders of the defined types were placed in this encounter.   Face-to-face time spent with patient was *** minutes. Greater than 50% of time was spent in counseling and coordination of care.  Follow-Up Instructions: No follow-ups on file.   Ellen Henri, CMA  Note - This record has been created using Animal nutritionist.  Chart creation errors have been sought, but may not always  have been located. Such creation errors do not reflect on  the standard of medical care.

## 2020-04-27 DIAGNOSIS — I10 Essential (primary) hypertension: Secondary | ICD-10-CM | POA: Diagnosis not present

## 2020-04-28 DIAGNOSIS — I10 Essential (primary) hypertension: Secondary | ICD-10-CM | POA: Diagnosis not present

## 2020-04-28 DIAGNOSIS — M069 Rheumatoid arthritis, unspecified: Secondary | ICD-10-CM | POA: Diagnosis not present

## 2020-05-10 ENCOUNTER — Ambulatory Visit: Payer: PRIVATE HEALTH INSURANCE | Admitting: Rheumatology

## 2020-05-29 DIAGNOSIS — M069 Rheumatoid arthritis, unspecified: Secondary | ICD-10-CM | POA: Diagnosis not present

## 2020-05-29 DIAGNOSIS — I10 Essential (primary) hypertension: Secondary | ICD-10-CM | POA: Diagnosis not present

## 2020-05-29 NOTE — Progress Notes (Signed)
Office Visit Note  Patient: Charles Jenkins             Date of Birth: 01/10/1961           MRN: [id number]             PCP: Glenda Chroman, MD Referring: Glenda Chroman, MD Visit Date: 06/07/2020 Occupation: @GUAROCC @  Subjective:  Medication management.   History of Present Illness: Charles Jenkins is a 60 y.o. 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  He states he is doing much better on the combination of Humira and methotrexate.  He denies any increased joint swelling.  There is no history of eye inflammation, Achilles tendinitis or plantar fasciitis.  He continues to have some stiffness in his joints due to underlying osteoarthritis.  Activities of Daily Living:  Patient reports morning stiffness for 15-20 minutes.   Patient Denies nocturnal pain.  Difficulty dressing/grooming: Denies Difficulty climbing stairs: Reports Difficulty getting out of chair: Reports Difficulty using hands for taps, buttons, cutlery, and/or writing: Reports  Review of Systems  Constitutional: Negative for fatigue.  HENT: Negative for mouth sores, mouth dryness and nose dryness.   Eyes: Negative for pain, itching and dryness.  Respiratory: Negative for shortness of breath and difficulty breathing.   Cardiovascular: Negative for chest pain and palpitations.  Gastrointestinal: Negative for blood in stool, constipation and diarrhea.  Endocrine: Negative for increased urination.  Genitourinary: Negative for difficulty urinating.  Musculoskeletal: Positive for arthralgias, joint pain and morning stiffness. Negative for joint swelling, myalgias, muscle tenderness and myalgias.  Skin: Negative for color change, rash and redness.  Allergic/Immunologic: Negative for susceptible to infections.  Neurological: Negative for dizziness, numbness, headaches, memory loss and weakness.  Hematological: Positive for bruising/bleeding tendency.  Psychiatric/Behavioral: Negative for confusion.    PMFS History:  Patient  Active Problem List   Diagnosis Date Noted  . Other meniscus derangements, posterior horn of medial meniscus, left knee 01/08/2017  . Family history of scleroderma 09/25/2016  . Obesity due to excess calories 09/25/2016  . Primary osteoarthritis of both hands 04/10/2016  . Primary osteoarthritis of both feet 04/10/2016  . High risk medication use 04/09/2016  . 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 Lumbar spinal stenosis 05/05/2014  . 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 Insomnia 03/10/2013  . Spinal stenosis of lumbar region 10/28/2012  . Sciatica 09/23/2012  . Essential hypertension, benign 07/24/2012  . Sacroiliitis ([place]) 07/24/2012  . Depression 07/24/2012    Past Medical History:  Diagnosis Date  . Ankylosing spondylitis ([place])   . Arthritis   . Back pain   . Chronic right SI joint pain   . Hypertension    "only in doctor's offices"  . Sleep apnea    per patient     Family History  Problem Relation Age of Onset  . Scleroderma Mother    Past Surgical History:  Procedure Laterality Date  . KNEE ARTHROPLASTY Left 2018  . LUMBAR LAMINECTOMY/DECOMPRESSION MICRODISCECTOMY Left 10/28/2012   Procedure: MICRO LUMBAR DECOMPRESSION L4 - L5 and L5 - S1 2 LEVELS;  Surgeon: Johnn Hai, MD;  Location: [facility];  Service: Orthopedics;  Laterality: Left;  . LUMBAR LAMINECTOMY/DECOMPRESSION MICRODISCECTOMY N/A 05/04/2014   Procedure: MICRO LUMBAR DECOMPRESSION L2-L3, L3-L4;  Surgeon: Johnn Hai, MD;  Location: [facility];  Service: Orthopedics;  Laterality: N/A;   Social History   Social History Narrative  . Not on file   Immunization History  Administered  Date(s) Administered  . Influenza,inj,quad, With Preservative 01/30/2017, 02/10/2018  . Influenza-Unspecified 02/27/2012, 01/27/2013, 02/08/2014  . Pneumococcal-Unspecified 02/27/2012     Objective: Vital Signs: BP 135/83 (BP Location: Left Arm, Patient Position:  Sitting, Cuff Size: Large)   Pulse 68   Resp 17   Ht 6' (1.829 m)   Wt (!) 349 lb (158.3 kg)   BMI 47.33 kg/m    Physical Exam Vitals and nursing note reviewed.  Constitutional:      Appearance: He is well-developed and well-nourished.  HENT:     Head: Normocephalic and atraumatic.  Eyes:     Extraocular Movements: EOM normal.     Conjunctiva/sclera: Conjunctivae normal.     Pupils: Pupils are equal, round, and reactive to light.  Cardiovascular:     Rate and Rhythm: Normal rate and regular rhythm.     Heart sounds: Normal heart sounds.  Pulmonary:     Effort: Pulmonary effort is normal.     Breath sounds: Normal breath sounds.  Abdominal:     General: Bowel sounds are normal.     Palpations: Abdomen is soft.  Musculoskeletal:     Cervical back: Normal range of motion and neck supple.  Skin:    General: Skin is warm and dry.     Capillary Refill: Capillary refill takes less than 2 seconds.  Neurological:     Mental Status: He is alert and oriented to person, place, and time.  Psychiatric:        Mood and Affect: Mood and affect normal.        Behavior: Behavior normal.      Musculoskeletal Exam: C-spine was in good range of motion.  He had no tenderness over thoracic or lumbar spine.  There was no tenderness over SI joints.  Shoulder joints, elbow joints, wrist joints with good range of motion.  He had bilateral PIP and DIP thickening with no synovitis.  Good range of motion of his hip joints and knee joints.  There was no tenderness over ankles or MTPs.  There was no evidence of plantar fasciitis or Achilles tendinitis. CDAI Exam: CDAI Score: -- Patient Global: --; Provider Global: -- Swollen: --; Tender: -- Joint Exam 06/07/2020   No joint exam has been documented for this visit   There is currently no information documented on the homunculus. Go to the Rheumatology activity and complete the homunculus joint exam.  Investigation: No additional  findings.  Imaging: No results found.  Recent Labs: Lab Results  Component Value Date   WBC 9.2 03/24/2020   HGB 14.3 03/24/2020   PLT 285 03/24/2020   NA 141 03/24/2020   K 4.4 03/24/2020   CL 103 03/24/2020   CO2 22 03/24/2020   GLUCOSE 82 03/24/2020   BUN 12 03/24/2020   CREATININE 0.95 03/24/2020   BILITOT 0.3 03/24/2020   ALKPHOS 94 03/24/2020   AST 13 03/24/2020   ALT 13 03/24/2020   PROT 7.1 03/24/2020   ALBUMIN 3.9 03/24/2020   CALCIUM 9.4 03/24/2020   GFRAA 102 03/24/2020   QFTBGOLDPLUS Negative 09/06/2019    Speciality Comments: Approved for Humira PAP through [facility] through 04/29/2019.  Procedures:  No procedures performed Allergies: Patient has no known allergies.   Assessment / Plan:     Visit Diagnoses: Spondyloarthropathy-history of inflammatory arthritis, sacroiliitis.  The symptoms have improved since he has been on combination therapy.  High risk medication use - Humira 40 mg sq injections every 14 days, methotrexate 3 tablets weekly and  folic acid 1 mg daily.  His labs have been stable.  We will check labs today and then every 3 months to monitor for drug toxicity.  TB gold will be checked with his next labs in May.  Primary osteoarthritis of both hands-he has underlying osteoarthritis which causes stiffness and discomfort.  Joint protection was discussed  Primary osteoarthritis of both feet-currently not having much discomfort.  Sacroiliitis ([facility])-resolved on Humira.  Trigger thumb of both hands-he had good response to the cortisone injections in the past.  He has occasional symptoms.  S/P arthroscopic surgery of left knee-doing well.  DDD (degenerative disc disease), lumbar-he denies any discomfort currently.  Primary insomnia  History of hypertension-his blood pressure is normal today.  History of colon polyps  History of sleep apnea - per patient, patient is on CPAP  Family history of scleroderma  BMI 47.33-weight loss diet  and exercise was emphasized.  He states since the omicron variant he stopped going to the gym which is causing weight gain.  Dietary modifications were discussed.  Have also given him a handout on weight management clinic.  Heart healthy diet was discussed and handout was placed in the AVS.  Educated about COVID-19 virus infection-he is fully immunized against COVID-19 and also received a third dose.  Have advised him to get a fourth dose 6 months after the third dose.  Holding methotrexate for a week after the dose was also discussed.  Instructions were placed in the AVS.  Use of mask, social distancing and hand hygiene was discussed.  Orders: Orders Placed This Encounter  Procedures  . CBC with Differential/Platelet  . COMPLETE METABOLIC PANEL WITH GFR  . QuantiFERON-TB Gold Plus   No orders of the defined types were placed in this encounter.    Follow-Up Instructions: Return in about 5 months (around 11/04/2020) for Spondyloarthropathy.   Bo Merino, MD  Note - This record has been created using Editor, commissioning.  Chart creation errors have been sought, but may not always  have been located. Such creation errors do not reflect on  the standard of medical care.

## 2020-06-07 ENCOUNTER — Encounter: Payer: Self-pay | Admitting: Rheumatology

## 2020-06-07 ENCOUNTER — Ambulatory Visit (INDEPENDENT_AMBULATORY_CARE_PROVIDER_SITE_OTHER): Payer: Medicare Other | Admitting: Rheumatology

## 2020-06-07 ENCOUNTER — Other Ambulatory Visit: Payer: Self-pay

## 2020-06-07 VITALS — BP 135/83 | HR 68 | Resp 17 | Ht 72.0 in | Wt 349.0 lb

## 2020-06-07 DIAGNOSIS — Z9889 Other specified postprocedural states: Secondary | ICD-10-CM | POA: Diagnosis not present

## 2020-06-07 DIAGNOSIS — M47819 Spondylosis without myelopathy or radiculopathy, site unspecified: Secondary | ICD-10-CM | POA: Diagnosis not present

## 2020-06-07 DIAGNOSIS — Z79899 Other long term (current) drug therapy: Secondary | ICD-10-CM

## 2020-06-07 DIAGNOSIS — Z7189 Other specified counseling: Secondary | ICD-10-CM

## 2020-06-07 DIAGNOSIS — Z8679 Personal history of other diseases of the circulatory system: Secondary | ICD-10-CM

## 2020-06-07 DIAGNOSIS — F5101 Primary insomnia: Secondary | ICD-10-CM | POA: Diagnosis not present

## 2020-06-07 DIAGNOSIS — M19071 Primary osteoarthritis, right ankle and foot: Secondary | ICD-10-CM | POA: Diagnosis not present

## 2020-06-07 DIAGNOSIS — M19042 Primary osteoarthritis, left hand: Secondary | ICD-10-CM

## 2020-06-07 DIAGNOSIS — M19072 Primary osteoarthritis, left ankle and foot: Secondary | ICD-10-CM

## 2020-06-07 DIAGNOSIS — M19041 Primary osteoarthritis, right hand: Secondary | ICD-10-CM

## 2020-06-07 DIAGNOSIS — M65311 Trigger thumb, right thumb: Secondary | ICD-10-CM

## 2020-06-07 DIAGNOSIS — Z6841 Body Mass Index (BMI) 40.0 and over, adult: Secondary | ICD-10-CM

## 2020-06-07 DIAGNOSIS — Z8269 Family history of other diseases of the musculoskeletal system and connective tissue: Secondary | ICD-10-CM

## 2020-06-07 DIAGNOSIS — Z8669 Personal history of other diseases of the nervous system and sense organs: Secondary | ICD-10-CM | POA: Diagnosis not present

## 2020-06-07 DIAGNOSIS — M461 Sacroiliitis, not elsewhere classified: Secondary | ICD-10-CM

## 2020-06-07 DIAGNOSIS — M65312 Trigger thumb, left thumb: Secondary | ICD-10-CM

## 2020-06-07 DIAGNOSIS — Z86010 Personal history of colonic polyps: Secondary | ICD-10-CM | POA: Diagnosis not present

## 2020-06-07 DIAGNOSIS — M5136 Other intervertebral disc degeneration, lumbar region: Secondary | ICD-10-CM | POA: Diagnosis not present

## 2020-06-07 NOTE — Patient Instructions (Signed)
Standing Labs We placed an order today for your standing lab work.   Please have your standing labs drawn in May and every 3 months  If possible, please have your labs drawn 2 weeks prior to your appointment so that the provider can discuss your results at your appointment.  We have open lab daily Monday through Thursday from 1:30-4:30 PM and Friday from 1:30-4:00 PM at the office of Dr. Bo Merino, [facility].   Please be advised, all patients with office appointments requiring lab work will take precedents over walk-in lab work.  If possible, please come for your lab work on Monday and Friday afternoons, as you may experience shorter wait times. The office is located at [address] [place], [place], [place] [address] No appointment is necessary.   Labs are drawn by [facility]. Please bring your co-pay at the time of your lab draw.  You may receive a bill from [facility] for your lab work.  If you wish to have your labs drawn at another location, please call the office 24 hours in advance to send orders.  If you have any questions regarding directions or hours of operation,  please call [phone].   As a reminder, please drink plenty of water prior to coming for your lab work. Thanks!  COVID-19 vaccine recommendations:   COVID-19 vaccine is recommended for everyone (unless you are allergic to a vaccine component), even if you are on a medication that suppresses your immune system.   If you are on Methotrexate, Cellcept (mycophenolate), Rinvoq, Morrie Sheldon, and Olumiant- hold the medication for 1 week after each vaccine. Hold Methotrexate for 2 weeks after the single dose COVID-19 vaccine.   The recommendations are to get  Three COVID vaccines 1 month apart and then a booster dose 6 months after the third vaccine for the immunocompromised individuals  Do not take Tylenol or any anti-inflammatory medications (NSAIDs) 24 hours prior to the COVID-19 vaccination.   There is  no direct evidence about the efficacy of the COVID-19 vaccine in individuals who are on medications that suppress the immune system.   Even if you are fully vaccinated, and you are on any medications that suppress your immune system, please continue to wear a mask, maintain at least six feet social distance and practice hand hygiene.   If you develop a COVID-19 infection, please contact your PCP or our office to determine if you need monoclonal antibody infusion.  The booster vaccine is now available for immunocompromised patients.   Please see the following web sites for updated information.   [email or web address]/[email or web address]-Patient-Resources.[email or web address]   Vaccines You are taking a medication(s) that can suppress your immune system.  The following immunizations are recommended: . Flu annually . Covid-19  . Pneumonia (Pneumovax 23 and Prevnar 13 spaced at least 1 year apart) . Shingrix (after age 58)  Please check with your PCP to make sure you are up to date.  Heart Disease Prevention   Your inflammatory disease increases your risk of heart disease which includes heart attack, stroke, atrial fibrillation (irregular heartbeats), high blood pressure, heart failure and atherosclerosis (plaque in the arteries).  It is important to reduce your risk by:   . Keep blood pressure, cholesterol, and blood sugar at healthy levels   . Smoking Cessation   . Maintain a healthy weight  o BMI 20-25   . Eat a healthy diet  o Plenty of fresh fruit, vegetables, and whole grains  o Limit saturated fats, foods high in sodium,  and added sugars  o DASH and Mediterranean diet   . Increase physical activity  o Recommend moderate physically activity for 150 minutes per week/ 30 minutes a day for five days a week These can be broken up into three separate ten-minute sessions during the day.   . Reduce Stress  . Meditation, slow breathing exercises, yoga, coloring books   . Dental visits twice a year

## 2020-06-08 LAB — CBC WITH DIFFERENTIAL/PLATELET
Absolute Monocytes: 886 cells/uL (ref 200–950)
Basophils Absolute: 86 cells/uL (ref 0–200)
Basophils Relative: 1 %
Eosinophils Absolute: 654 cells/uL — ABNORMAL HIGH (ref 15–500)
Eosinophils Relative: 7.6 %
HCT: 45.7 % (ref 38.5–50.0)
Hemoglobin: 15 g/dL (ref 13.2–17.1)
Lymphs Abs: 2296 cells/uL (ref 850–3900)
MCH: 29.9 pg (ref 27.0–33.0)
MCHC: 32.8 g/dL (ref 32.0–36.0)
MCV: 91 fL (ref 80.0–100.0)
MPV: 11.3 fL (ref 7.5–12.5)
Monocytes Relative: 10.3 %
Neutro Abs: 4678 cells/uL (ref 1500–7800)
Neutrophils Relative %: 54.4 %
Platelets: 300 10*3/uL (ref 140–400)
RBC: 5.02 10*6/uL (ref 4.20–5.80)
RDW: 12.2 % (ref 11.0–15.0)
Total Lymphocyte: 26.7 %
WBC: 8.6 10*3/uL (ref 3.8–10.8)

## 2020-06-08 LAB — COMPLETE METABOLIC PANEL WITH GFR
AG Ratio: 1.1 (calc) (ref 1.0–2.5)
ALT: 18 U/L (ref 9–46)
AST: 17 U/L (ref 10–35)
Albumin: 4.1 g/dL (ref 3.6–5.1)
Alkaline phosphatase (APISO): 70 U/L (ref 35–144)
BUN: 14 mg/dL (ref 7–25)
CO2: 32 mmol/L (ref 20–32)
Calcium: 9.8 mg/dL (ref 8.6–10.3)
Chloride: 102 mmol/L (ref 98–110)
Creat: 0.84 mg/dL (ref 0.70–1.33)
GFR, Est African American: 113 mL/min/{1.73_m2} (ref >=60)
GFR, Est Non African American: 97 mL/min/{1.73_m2} (ref >=60)
Globulin: 3.8 g/dL (calc) — ABNORMAL HIGH (ref 1.9–3.7)
Glucose, Bld: 86 mg/dL (ref 65–99)
Potassium: 5.4 mmol/L — ABNORMAL HIGH (ref 3.5–5.3)
Sodium: 140 mmol/L (ref 135–146)
Total Bilirubin: 0.5 mg/dL (ref 0.2–1.2)
Total Protein: 7.9 g/dL (ref 6.1–8.1)

## 2020-06-08 NOTE — Progress Notes (Signed)
CBC shows elevated eosinophils most likely due to allergies.  CMP shows elevated potassium, most likely a hemolyzed sample.  Please forward labs to his PCP.

## 2020-06-20 ENCOUNTER — Other Ambulatory Visit: Payer: Self-pay

## 2020-06-20 MED ORDER — METHOTREXATE SODIUM 2.5 MG PO TABS
ORAL_TABLET | ORAL | 0 refills | Status: DC
Start: 2020-06-20 — End: 2020-09-11

## 2020-06-20 NOTE — Telephone Encounter (Signed)
Prescription was not received by fax.   Last Visit: 06/07/2020 Next Visit: 11/06/2020 Labs: 06/07/2020 CBC shows elevated eosinophils most likely due to allergies. CMP shows elevated potassium  Current Dose per office note 06/07/2020: methotrexate 3 tablets weekly  DX: Spondyloarthropathy  Last Fill: 03/27/2020  Okay to refill MTX?

## 2020-06-20 NOTE — Telephone Encounter (Signed)
Patient called requesting prescription refill of Methotrexate to be sent to [facility].  Patient states his wife works for [employer] and they faxed prescription refill request yesterday, 06/19/20.

## 2020-06-21 ENCOUNTER — Other Ambulatory Visit: Payer: Self-pay

## 2020-06-21 MED ORDER — HUMIRA (2 PEN) 40 MG/0.4ML ~~LOC~~ AJKT
40.0000 mg | AUTO-INJECTOR | SUBCUTANEOUS | 0 refills | Status: DC
Start: 2020-06-21 — End: 2020-08-29

## 2020-06-21 NOTE — Telephone Encounter (Signed)
Patient called requesting prescription refill of Humira to be sent to [facility].  Patient states he has one injection remaining that he will take on Monday, 06/26/20 and he is out of refills.

## 2020-06-21 NOTE — Telephone Encounter (Signed)
Last Visit: 06/07/2020 Next Visit: 11/06/2020 Labs: 06/07/2020 CBC shows elevated eosinophils most likely due to allergies. CMP shows elevated potassium TB Gold: 09/06/2019 Neg   Current Dose per office note 06/07/2020: Humira 40 mg sq injections every 14 days DX: Spondyloarthropathy  Okay to refill Humira?

## 2020-06-26 DIAGNOSIS — I10 Essential (primary) hypertension: Secondary | ICD-10-CM | POA: Diagnosis not present

## 2020-06-27 ENCOUNTER — Other Ambulatory Visit: Payer: Self-pay | Admitting: *Deleted

## 2020-06-27 DIAGNOSIS — G4733 Obstructive sleep apnea (adult) (pediatric): Secondary | ICD-10-CM | POA: Diagnosis not present

## 2020-06-30 DIAGNOSIS — Z299 Encounter for prophylactic measures, unspecified: Secondary | ICD-10-CM | POA: Diagnosis not present

## 2020-06-30 DIAGNOSIS — D849 Immunodeficiency, unspecified: Secondary | ICD-10-CM | POA: Diagnosis not present

## 2020-06-30 DIAGNOSIS — M069 Rheumatoid arthritis, unspecified: Secondary | ICD-10-CM | POA: Diagnosis not present

## 2020-06-30 DIAGNOSIS — Z79899 Other long term (current) drug therapy: Secondary | ICD-10-CM | POA: Diagnosis not present

## 2020-06-30 DIAGNOSIS — I10 Essential (primary) hypertension: Secondary | ICD-10-CM | POA: Diagnosis not present

## 2020-07-27 DIAGNOSIS — I10 Essential (primary) hypertension: Secondary | ICD-10-CM | POA: Diagnosis not present

## 2020-08-25 DIAGNOSIS — I10 Essential (primary) hypertension: Secondary | ICD-10-CM | POA: Diagnosis not present

## 2020-08-26 DIAGNOSIS — I10 Essential (primary) hypertension: Secondary | ICD-10-CM | POA: Diagnosis not present

## 2020-08-26 DIAGNOSIS — M069 Rheumatoid arthritis, unspecified: Secondary | ICD-10-CM | POA: Diagnosis not present

## 2020-08-29 ENCOUNTER — Other Ambulatory Visit: Payer: Self-pay | Admitting: *Deleted

## 2020-08-29 MED ORDER — HUMIRA (2 PEN) 40 MG/0.4ML ~~LOC~~ AJKT: 40.0000 mg | AUTO-INJECTOR | SUBCUTANEOUS | 0 refills | Status: DC

## 2020-08-29 NOTE — Telephone Encounter (Signed)
LMOM labs are due. 

## 2020-08-29 NOTE — Telephone Encounter (Signed)
RX faxed from [facility]  Next Visit: 11/06/2020  Last Visit: 06/07/2020  Last Fill: 06/21/2020  DX: Spondyloarthropathy  Current Dose per office note 06/07/2020, Humira 40 mg sq injections every 14 days  Labs: 06/07/2020, CBC shows elevated eosinophils most likely due to allergies. CMP shows elevated potassium, most likely a hemolyzed sample.   TB Gold: 09/06/2019, negative  Okay to refill Humira?

## 2020-08-29 NOTE — Telephone Encounter (Signed)
Please advise the patient to return to update lab work this week or next week.

## 2020-08-31 ENCOUNTER — Telehealth: Payer: Self-pay | Admitting: Rheumatology

## 2020-08-31 NOTE — Telephone Encounter (Signed)
Patient going to [facility] Monday for draw. Please release orders.

## 2020-09-01 ENCOUNTER — Other Ambulatory Visit: Payer: Self-pay | Admitting: *Deleted

## 2020-09-01 DIAGNOSIS — Z79899 Other long term (current) drug therapy: Secondary | ICD-10-CM

## 2020-09-01 NOTE — Telephone Encounter (Signed)
released

## 2020-09-01 NOTE — Addendum Note (Signed)
Addended by: Shona Needles on: 09/01/2020 02:59 PM   Modules accepted: Orders

## 2020-09-04 DIAGNOSIS — Z79899 Other long term (current) drug therapy: Secondary | ICD-10-CM | POA: Diagnosis not present

## 2020-09-05 NOTE — Progress Notes (Signed)
CBC and CMP normal.  TB gold is pending.

## 2020-09-07 LAB — CBC WITH DIFFERENTIAL/PLATELET
Basophils Absolute: 0.1 10*3/uL (ref 0.0–0.2)
Basos: 1 %
EOS (ABSOLUTE): 0.5 10*3/uL — ABNORMAL HIGH (ref 0.0–0.4)
Eos: 8 %
Hematocrit: 48.1 % (ref 37.5–51.0)
Hemoglobin: 15.2 g/dL (ref 13.0–17.7)
Immature Grans (Abs): 0 10*3/uL (ref 0.0–0.1)
Immature Granulocytes: 1 %
Lymphocytes Absolute: 1.6 10*3/uL (ref 0.7–3.1)
Lymphs: 24 %
MCH: 28.8 pg (ref 26.6–33.0)
MCHC: 31.6 g/dL (ref 31.5–35.7)
MCV: 91 fL (ref 79–97)
Monocytes Absolute: 1.2 10*3/uL — ABNORMAL HIGH (ref 0.1–0.9)
Monocytes: 18 %
Neutrophils Absolute: 3.2 10*3/uL (ref 1.4–7.0)
Neutrophils: 48 %
Platelets: 288 10*3/uL (ref 150–450)
RBC: 5.28 x10E6/uL (ref 4.14–5.80)
RDW: 12.7 % (ref 11.6–15.4)
WBC: 6.5 10*3/uL (ref 3.4–10.8)

## 2020-09-07 LAB — CMP14+EGFR
ALT: 23 IU/L (ref 0–44)
AST: 24 IU/L (ref 0–40)
Albumin/Globulin Ratio: 1.3 (ref 1.2–2.2)
Albumin: 4.2 g/dL (ref 3.8–4.9)
Alkaline Phosphatase: 85 IU/L (ref 44–121)
BUN/Creatinine Ratio: 14 (ref 9–20)
BUN: 14 mg/dL (ref 6–24)
Bilirubin Total: 0.5 mg/dL (ref 0.0–1.2)
CO2: 24 mmol/L (ref 20–29)
Calcium: 9.6 mg/dL (ref 8.7–10.2)
Chloride: 103 mmol/L (ref 96–106)
Creatinine, Ser: 1 mg/dL (ref 0.76–1.27)
Globulin, Total: 3.3 g/dL (ref 1.5–4.5)
Glucose: 86 mg/dL (ref 65–99)
Potassium: 5 mmol/L (ref 3.5–5.2)
Sodium: 142 mmol/L (ref 134–144)
Total Protein: 7.5 g/dL (ref 6.0–8.5)
eGFR: 88 mL/min/{1.73_m2} (ref >59)

## 2020-09-07 LAB — QUANTIFERON-TB GOLD PLUS
QuantiFERON Mitogen Value: >10 IU/mL
QuantiFERON Nil Value: 0.08 IU/mL
QuantiFERON TB1 Ag Value: 0.08 IU/mL
QuantiFERON TB2 Ag Value: 0.08 IU/mL
QuantiFERON-TB Gold Plus: NEGATIVE

## 2020-09-11 ENCOUNTER — Other Ambulatory Visit: Payer: Self-pay | Admitting: Physician Assistant

## 2020-09-11 NOTE — Telephone Encounter (Signed)
Next Visit: 11/06/2020  Last Visit: 06/07/2020  Last Fill: 06/20/2020  DX: Spondyloarthropathy  Current Dose per office note 06/07/2020, methotrexate 3 tablets weekly  Labs: 09/04/2020, CBC and CMP normal.  Okay to refill MTX?

## 2020-09-18 ENCOUNTER — Telehealth: Payer: Self-pay | Admitting: Rheumatology

## 2020-09-18 ENCOUNTER — Other Ambulatory Visit: Payer: Self-pay | Admitting: *Deleted

## 2020-09-18 NOTE — Telephone Encounter (Signed)
Next Visit: 11/06/2020  Last Visit: 06/07/2020  Last Fill: 08/29/2020  DX: Spondyloarthropathy  Current Dose per office note 06/07/2020, Humira 40 mg sq injections every 14 days  Labs: 09/04/2020, CBC and CMP normal.  TB Gold: 09/04/2020, TB gold negative.   Okay to refill Humira?

## 2020-09-18 NOTE — Telephone Encounter (Signed)
#  1.Patient requesting refill on Humira sent to [facility].  #2.Also, patient received letter stating it is time to reapply for patient assistance program for next year. Please advise patient.

## 2020-09-19 MED ORDER — HUMIRA (2 PEN) 40 MG/0.4ML ~~LOC~~ AJKT: 40.0000 mg | AUTO-INJECTOR | SUBCUTANEOUS | 0 refills | Status: DC

## 2020-09-19 NOTE — Telephone Encounter (Signed)
Rx for Humira was sent to pharmacy on 08/29/20  Patient is approved for [facility] for Humira through 04/28/21 based on approval letter. Called patient and wife Juliann Pulse) to advise.  Nothing further needed.  Knox Saliva, PharmD, MPH Clinical Pharmacist (Rheumatology and Pulmonology)

## 2020-09-26 DIAGNOSIS — R5383 Other fatigue: Secondary | ICD-10-CM | POA: Diagnosis not present

## 2020-09-26 DIAGNOSIS — Z0000 Encounter for general adult medical examination without abnormal findings: Secondary | ICD-10-CM | POA: Diagnosis not present

## 2020-09-26 DIAGNOSIS — E7800 Pure hypercholesterolemia, unspecified: Secondary | ICD-10-CM | POA: Diagnosis not present

## 2020-09-26 DIAGNOSIS — M069 Rheumatoid arthritis, unspecified: Secondary | ICD-10-CM | POA: Diagnosis not present

## 2020-09-26 DIAGNOSIS — I10 Essential (primary) hypertension: Secondary | ICD-10-CM | POA: Diagnosis not present

## 2020-09-26 DIAGNOSIS — Z299 Encounter for prophylactic measures, unspecified: Secondary | ICD-10-CM | POA: Diagnosis not present

## 2020-09-26 DIAGNOSIS — Z7189 Other specified counseling: Secondary | ICD-10-CM | POA: Diagnosis not present

## 2020-10-04 DIAGNOSIS — G4733 Obstructive sleep apnea (adult) (pediatric): Secondary | ICD-10-CM | POA: Diagnosis not present

## 2020-10-23 NOTE — Progress Notes (Deleted)
Office Visit Note  Patient: Charles Jenkins             Date of Birth: July 20, 1960           MRN: [id number]             PCP: Glenda Chroman, MD Referring: Glenda Chroman, MD Visit Date: 11/06/2020 Occupation: @GUAROCC @  Subjective:    History of Present Illness: Charles Jenkins is a 60 y.o. male with history of spondyloarthropathy, osteoarthritis, and DDD. He is on humira 40 mg sq injections every 14 days, methotrexate 3 tablets once weekly, and folic acid 1 mg daily.   CBC and CMP drawn on 09/04/20.  He will be due to update lab work in August and every 3 months.  Standing orders for CBC and [facility] at [facility] were [facility] today. TB gold negative on 09/04/20.     Activities of Daily Living:  Patient reports morning stiffness for *** {minute/hour:19697}.   Patient {ACTIONS;DENIES/REPORTS:21021675::"Denies"} nocturnal pain.  Difficulty dressing/grooming: {ACTIONS;DENIES/REPORTS:21021675::"Denies"} Difficulty climbing stairs: {ACTIONS;DENIES/REPORTS:21021675::"Denies"} Difficulty getting out of chair: {ACTIONS;DENIES/REPORTS:21021675::"Denies"} Difficulty using hands for taps, buttons, cutlery, and/or writing: {ACTIONS;DENIES/REPORTS:21021675::"Denies"}  No Rheumatology ROS completed.   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   Insomnia 03/10/2013   Spinal stenosis of lumbar region 10/28/2012   Sciatica 09/23/2012   Essential hypertension, benign 07/24/2012   Sacroiliitis ([place])  07/24/2012   Depression 07/24/2012    Past Medical History:  Diagnosis Date   Ankylosing spondylitis ([facility])    Arthritis    Back pain    Chronic right SI joint pain    Hypertension    "only in doctor's offices"   Sleep apnea    per patient     Family History  Problem Relation Age of Onset   Scleroderma Mother    Past Surgical History:  Procedure Laterality Date   KNEE ARTHROPLASTY Left 2018   LUMBAR LAMINECTOMY/DECOMPRESSION MICRODISCECTOMY Left 10/28/2012   Procedure: MICRO LUMBAR DECOMPRESSION L4 - L5 and L5 - S1 2 LEVELS;  Surgeon: Johnn Hai, MD;  Location: [facility];  Service: Orthopedics;  Laterality: Left;   LUMBAR LAMINECTOMY/DECOMPRESSION MICRODISCECTOMY N/A 05/04/2014   Procedure: MICRO LUMBAR DECOMPRESSION L2-L3, L3-L4;  Surgeon: Johnn Hai, MD;  Location: [facility];  Service: Orthopedics;  Laterality: N/A;   Social History   Social History Narrative   Not on file   Immunization History  Administered Date(s) Administered   Influenza,inj,quad, With Preservative 01/30/2017, 02/10/2018   Influenza-Unspecified 02/27/2012, 01/27/2013, 02/08/2014   Moderna Sars-Covid-2 Vaccination 07/14/2019, 08/09/2019, 12/16/2019   Pneumococcal-Unspecified 02/27/2012     Objective: Vital Signs: There were no vitals taken for this visit.   Physical Exam   Musculoskeletal Exam: ***  CDAI Exam: CDAI Score: -- Patient Global: --; Provider Global: -- Swollen: --; Tender: -- Joint Exam 11/06/2020   No joint exam has been documented for this visit   There is currently no information documented on the homunculus. Go to the Rheumatology activity and complete the homunculus joint exam.  Investigation: No additional findings.  Imaging: No results found.  Recent Labs: Lab Results  Component Value Date   WBC 6.5 09/04/2020   HGB 15.2 09/04/2020   PLT 288 09/04/2020   NA 142 09/04/2020   K 5.0 09/04/2020   CL 103 09/04/2020   CO2 24 09/04/2020   GLUCOSE 86 09/04/2020    BUN 14 09/04/2020   CREATININE 1.00 09/04/2020   BILITOT 0.5 09/04/2020   ALKPHOS 85 09/04/2020   AST 24 09/04/2020   ALT 23 09/04/2020   PROT 7.5 09/04/2020   ALBUMIN 4.2 09/04/2020   CALCIUM 9.6 09/04/2020   GFRAA 113 06/07/2020   QFTBGOLDPLUS Negative 09/04/2020    Speciality Comments: Approved for Humira PAP through [facility] through 04/29/2019.  Procedures:  No procedures performed Allergies: Patient has no known allergies.   Assessment / Plan:     Visit Diagnoses: No diagnosis found.  Orders: No orders of the defined types were placed in this encounter.  No orders of the defined types were placed in this encounter.   Face-to-face time spent with patient was *** minutes. Greater than 50% of time was spent in counseling and coordination of care.  Follow-Up Instructions: No follow-ups on file.   Earnestine Mealing, CMA  Note - This record has been created using Editor, commissioning.  Chart creation errors have been sought, but may not always  have been located. Such creation errors do not reflect on  the standard of medical care.

## 2020-10-26 DIAGNOSIS — I10 Essential (primary) hypertension: Secondary | ICD-10-CM | POA: Diagnosis not present

## 2020-11-03 DIAGNOSIS — G4733 Obstructive sleep apnea (adult) (pediatric): Secondary | ICD-10-CM | POA: Diagnosis not present

## 2020-11-06 ENCOUNTER — Ambulatory Visit: Payer: Medicare Other | Admitting: Physician Assistant

## 2020-11-13 NOTE — Progress Notes (Signed)
Office Visit Note  Patient: Charles [place]             Date of Birth: 1960-09-08           MRN: [id number]             PCP: Glenda Chroman, MD Referring: Glenda Chroman, MD Visit Date: 11/27/2020 Occupation: @GUAROCC @  Subjective:  Right knee joint pain   History of Present Illness: Charles [place] is a 60 y.o. male with history of spondyloarthropathy, osteoarthritis, and DDD.  Patient is on Humira 40 mg subcutaneous injections every 14 days, methotrexate 3 tablets by mouth once weekly, and folic acid 1 mg by mouth daily. He denies missing any doses recently.  He denies any recent flares.  He has been experiencing intermittent pain in the right knee joint for the past 3 weeks.  He denies any falls or injuries prior to the onset of symptoms.  He has been walking on the treadmill at [employer] and notices some increased discomfort after walking or standing for a prolonged period of time.  He denies any warmth or joint swelling.  He denies any mechanical symptoms. He describes the discomfort as a dull ache that comes and goes.  He states at times the pain is a 5/10 and he takes tramadol as needed for pain relief.  He has also used voltaren gel as needed for pain relief.  He denies any pain in the left knee joint at this time.  He has not followed up with Dr. Durward Fortes recently.  He has occasional discomfort in his SI joints if he sits for prolonged periods of time.  He denies any other joint pain or joint swelling at this time.  He denies any achilles tendonitis or plantar fasciitis.  He denies any eye pain or inflammation.  He has not had any recent rashes.  He denies any other new concerns at this time.  He denies any recent infections.     Activities of Daily Living:  Patient reports morning stiffness for 10 minutes.   Patient Denies nocturnal pain.  Difficulty dressing/grooming: Denies Difficulty climbing stairs: Reports Difficulty getting out of chair: Reports Difficulty using hands  for taps, buttons, cutlery, and/or writing: Reports  Review of Systems  Constitutional:  Negative for fatigue.  HENT:  Negative for mouth sores, mouth dryness and nose dryness.   Eyes:  Negative for pain, itching and dryness.  Respiratory:  Negative for shortness of breath and difficulty breathing.   Cardiovascular:  Negative for chest pain and palpitations.  Gastrointestinal:  Negative for blood in stool, constipation and diarrhea.  Endocrine: Negative for increased urination.  Genitourinary:  Negative for difficulty urinating.  Musculoskeletal:  Positive for joint pain, joint pain and morning stiffness. Negative for joint swelling, myalgias, muscle tenderness and myalgias.  Skin:  Negative for color change, rash and redness.  Allergic/Immunologic: Negative for susceptible to infections.  Neurological:  Negative for dizziness, numbness, headaches, memory loss and weakness.  Hematological:  Positive for bruising/bleeding tendency.  Psychiatric/Behavioral:  Negative for confusion.    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   Insomnia 03/10/2013   Spinal stenosis of lumbar region 10/28/2012   Sciatica 09/23/2012   Essential hypertension, benign 07/24/2012   Sacroiliitis ([place]) 07/24/2012   Depression 07/24/2012    Past Medical History:  Diagnosis Date   Ankylosing spondylitis (HCC)    Arthritis    Back pain    Chronic right SI joint pain    Hypertension    "only in doctor's  offices"   Sleep apnea    per patient     Family History  Problem Relation Age of Onset   Scleroderma Mother    Past Surgical History:  Procedure Laterality Date   KNEE ARTHROPLASTY Left 2018   LUMBAR LAMINECTOMY/DECOMPRESSION MICRODISCECTOMY Left 10/28/2012   Procedure: MICRO LUMBAR DECOMPRESSION L4 - L5 and L5 - S1 2 LEVELS;  Surgeon: Johnn Hai, MD;  Location: [facility];  Service: Orthopedics;  Laterality: Left;   LUMBAR LAMINECTOMY/DECOMPRESSION MICRODISCECTOMY N/A 05/04/2014   Procedure: MICRO LUMBAR DECOMPRESSION L2-L3, L3-L4;  Surgeon: Johnn Hai, MD;  Location: [facility];  Service: Orthopedics;  Laterality: N/A;   Social History   Social History Narrative   Not on file   Immunization History  Administered Date(s) Administered   Influenza,inj,quad, With Preservative 01/30/2017, 02/10/2018   Influenza-Unspecified 02/27/2012, 01/27/2013, 02/08/2014   Moderna Sars-Covid-2 Vaccination 07/14/2019, 08/09/2019, 12/16/2019   Pneumococcal-Unspecified 02/27/2012     Objective: Vital Signs: BP (!) 157/89 (BP Location: Left Wrist, Patient Position: Sitting, Cuff Size: Large)   Pulse 60   Ht 6' (1.829 m)   Wt (!) 343 lb 9.6 oz (155.9 kg)   BMI 46.60 kg/m    Physical Exam Vitals and nursing note reviewed.  Constitutional:      Appearance: He is well-developed.  HENT:     Head: Normocephalic and atraumatic.  Eyes:     Conjunctiva/sclera: Conjunctivae normal.     Pupils: Pupils are equal, round, and reactive to light.  Pulmonary:     Effort: Pulmonary effort is normal.  Abdominal:     Palpations: Abdomen is soft.  Musculoskeletal:     Cervical back: Normal range of motion and neck supple.  Skin:    General: Skin is warm and dry.     Capillary Refill: Capillary refill takes less than 2 seconds.  Neurological:     Mental Status: He is alert and oriented to person, place, and time.  Psychiatric:        Behavior: Behavior normal.     Musculoskeletal Exam: C-spine,  thoracic spine, and lumbar spine good ROM.  No midline spinal tenderness or SI joint tenderness.  Shoulder joints, elbow joints, wrist joints, MCPs, PIPs, and DIPs good ROM with no synovitis. PIP and DIP prominence consistent with OA of both hands.  Complete fist formation bilaterally.  Hip joints have good ROM with no discomfort.  Left knee has good ROM with no discomfort. Right knee has good ROM with some discomfort.  No warmth or effusion of knees noted.  No tenderness or swelling of ankle joints.  No tenderness over MTP joints.  No evidence of achilles tendonitis or plantar fasciitis.   CDAI Exam: CDAI Score: -- Patient Global: --; Provider Global: -- Swollen: --; Tender: -- Joint Exam 11/27/2020   No joint exam has been documented for this visit   There is currently no information documented on the homunculus. Go to the Rheumatology activity and complete the homunculus joint exam.  Investigation: No additional findings.  Imaging: No results found.  Recent Labs: Lab Results  Component Value Date   WBC 6.5 09/04/2020   HGB 15.2 09/04/2020   PLT 288 09/04/2020   NA 142 09/04/2020   K 5.0 09/04/2020   CL 103 09/04/2020   CO2 24 09/04/2020   GLUCOSE 86 09/04/2020   BUN 14 09/04/2020   CREATININE 1.00 09/04/2020   BILITOT 0.5 09/04/2020   ALKPHOS 85 09/04/2020   AST 24 09/04/2020   ALT 23 09/04/2020   PROT 7.5 09/04/2020   ALBUMIN 4.2 09/04/2020   CALCIUM 9.6 09/04/2020   GFRAA 113 06/07/2020   QFTBGOLDPLUS Negative 09/04/2020    Speciality Comments: Approved for Humira PAP through [facility] through 04/29/2019.  Procedures:  No procedures performed Allergies: Patient has no known allergies.    Assessment / Plan:     Visit Diagnoses: Spondyloarthropathy - History of inflammatory arthritis, sacroiliitis: He has no synovitis or dactylitis on examination today.  He has not had any signs or symptoms of a flare recently.  He is clinically doing well on Humira 40 mg  subcutaneous injections every 14 days, methotrexate 3 tablets by mouth once weekly, and folic acid 1 mg by mouth daily.  C-spine, thoracic spine, lumbar spine have good range of motion with no discomfort.  No midline spinal tenderness or SI joint tenderness was noted.  He has occasional SI joint discomfort after sitting for prolonged periods of time but has not been experiencing any nocturnal pain.  He has no evidence of Achilles tendinitis or plantar fasciitis on examination today.  He has been experiencing some increased discomfort in the right knee intermittently over the past 3 weeks.  He did not have any injury or fall prior to the onset of symptoms and has no mechanical symptoms at this time.  On examination no warmth or effusion of the right knee joint was noted.  He declined updated x-rays or cortisone injection for the right knee today.  He was encouraged to use Voltaren gel topically as needed for pain relief.  We also discussed, ice, elevation, weight loss, and lower extremity muscle strengthening.  He plans to continue to take tramadol as needed for pain relief.  He was advised to notify us if his right knee joint discomfort persists or worsens.  He will remain on the current treatment regimen.  He will follow-up in the office in 5 months.  High risk medication use - Humira 40 mg sq injections every 14 days, methotrexate 3 tablets weekly, and folic acid 1 mg daily.  CBC and CMP were drawn on 09/04/2020.  He is due to update lab work today.  Orders for CBC and CMP were released.  His next lab work will be due in November and every 3 months to monitor for drug toxicity.  Standing orders for CBC and CMP are in place.  TB Gold negative on 09/04/2020.- Plan: CBC with Differential/Platelet, COMPLETE METABOLIC PANEL WITH GFR He has not had any recent infections.  He was advised to hold Humira and MTX if he develops signs or symptoms of an infection and to resume once the infection has completely cleared.    Discussed the importance of yearly skin exams while on Humira.   Primary osteoarthritis of both hands: He has PIP and DIP prominence consistent with osteoarthritis of both hands.  No tenderness or synovitis was noted.  He was able to make a complete fist bilaterally.  Discussed the importance of joint protection and muscle  strengthening.  Primary osteoarthritis of both feet: He is not experiencing any discomfort in his feet at this time.  He has good range of motion of both ankle joints with no tenderness or inflammation.  No tenderness over MTP joints.  No evidence of Achilles tendinitis or plantar fasciitis.  Sacroiliitis ([place]) - Resolved on Humira.  He has occasional discomfort in his SI joints after sitting for prolonged periods of time.  We discussed the importance of regular exercise and weight loss.  He had no tenderness over the SI joints on examination today.  Trigger thumb of both hands: Resolved.  S/P arthroscopic surgery of left knee: Performed by Dr. Durward Fortes.  Doing well.  He has good range of motion of the left knee joint with no discomfort at this time.  No warmth or effusion was noted.  DDD (degenerative disc disease), lumbar: He experiences occasional discomfort and stiffness in his lower back after sitting for prolonged periods of time.  He has no midline spinal tenderness currently.  Other medical conditions are listed as follows:   Primary insomnia  History of colon polyps  History of sleep apnea - per patient, patient is on CPAP   History of hypertension  Family history of scleroderma  Orders: Orders Placed This Encounter  Procedures   CBC with Differential/Platelet   COMPLETE METABOLIC PANEL WITH GFR   No orders of the defined types were placed in this encounter.    Follow-Up Instructions: Return in about 5 months (around 04/29/2021) for Spondyloarthropathy, Osteoarthritis.   Ofilia Neas, PA-C  Note - This record has been created using Dragon  software.  Chart creation errors have been sought, but may not always  have been located. Such creation errors do not reflect on  the standard of medical care.

## 2020-11-24 DIAGNOSIS — I10 Essential (primary) hypertension: Secondary | ICD-10-CM | POA: Diagnosis not present

## 2020-11-26 DIAGNOSIS — I10 Essential (primary) hypertension: Secondary | ICD-10-CM | POA: Diagnosis not present

## 2020-11-26 DIAGNOSIS — M069 Rheumatoid arthritis, unspecified: Secondary | ICD-10-CM | POA: Diagnosis not present

## 2020-11-27 ENCOUNTER — Ambulatory Visit (INDEPENDENT_AMBULATORY_CARE_PROVIDER_SITE_OTHER): Payer: Medicare Other | Admitting: Physician Assistant

## 2020-11-27 ENCOUNTER — Other Ambulatory Visit: Payer: Self-pay

## 2020-11-27 ENCOUNTER — Encounter: Payer: Self-pay | Admitting: Physician Assistant

## 2020-11-27 VITALS — BP 157/89 | HR 60 | Ht 72.0 in | Wt 343.6 lb

## 2020-11-27 DIAGNOSIS — M461 Sacroiliitis, not elsewhere classified: Secondary | ICD-10-CM

## 2020-11-27 DIAGNOSIS — M19042 Primary osteoarthritis, left hand: Secondary | ICD-10-CM

## 2020-11-27 DIAGNOSIS — Z79899 Other long term (current) drug therapy: Secondary | ICD-10-CM | POA: Diagnosis not present

## 2020-11-27 DIAGNOSIS — F5101 Primary insomnia: Secondary | ICD-10-CM

## 2020-11-27 DIAGNOSIS — M47819 Spondylosis without myelopathy or radiculopathy, site unspecified: Secondary | ICD-10-CM | POA: Diagnosis not present

## 2020-11-27 DIAGNOSIS — Z8269 Family history of other diseases of the musculoskeletal system and connective tissue: Secondary | ICD-10-CM

## 2020-11-27 DIAGNOSIS — M19072 Primary osteoarthritis, left ankle and foot: Secondary | ICD-10-CM

## 2020-11-27 DIAGNOSIS — Z8679 Personal history of other diseases of the circulatory system: Secondary | ICD-10-CM | POA: Diagnosis not present

## 2020-11-27 DIAGNOSIS — M19071 Primary osteoarthritis, right ankle and foot: Secondary | ICD-10-CM | POA: Diagnosis not present

## 2020-11-27 DIAGNOSIS — M19041 Primary osteoarthritis, right hand: Secondary | ICD-10-CM

## 2020-11-27 DIAGNOSIS — M65311 Trigger thumb, right thumb: Secondary | ICD-10-CM

## 2020-11-27 DIAGNOSIS — Z9889 Other specified postprocedural states: Secondary | ICD-10-CM

## 2020-11-27 DIAGNOSIS — Z8669 Personal history of other diseases of the nervous system and sense organs: Secondary | ICD-10-CM | POA: Diagnosis not present

## 2020-11-27 DIAGNOSIS — M65312 Trigger thumb, left thumb: Secondary | ICD-10-CM

## 2020-11-27 DIAGNOSIS — Z86010 Personal history of colonic polyps: Secondary | ICD-10-CM

## 2020-11-27 DIAGNOSIS — M5136 Other intervertebral disc degeneration, lumbar region: Secondary | ICD-10-CM | POA: Diagnosis not present

## 2020-11-27 NOTE — Patient Instructions (Addendum)
Standing Labs We placed an order today for your standing lab work.   Please have your standing labs drawn in November and every 3 months   If possible, please have your labs drawn 2 weeks prior to your appointment so that the provider can discuss your results at your appointment.  Please note that you may see your imaging and lab results in [place] before we have reviewed them. We may be awaiting multiple results to interpret others before contacting you. Please allow our office up to 72 hours to thoroughly review all of the results before contacting the office for clarification of your results.  We have open lab daily: Monday through Thursday from 1:30-4:30 PM and Friday from 1:30-4:00 PM at the office of Dr. Bo Merino, [facility].   Please be advised, all patients with office appointments requiring lab work will take precedent over walk-in lab work.  If possible, please come for your lab work on Monday and Friday afternoons, as you may experience shorter wait times. The office is located at [address], [place], [place], [place] [address] No appointment is necessary.   Labs are drawn by [facility]. Please bring your co-pay at the time of your lab draw.  You may receive a bill from [facility] for your lab work.  If you wish to have your labs drawn at another location, please call the office 24 hours in advance to send orders.  If you have any questions regarding directions or hours of operation,  please call [phone].   As a reminder, please drink plenty of water prior to coming for your lab work. Thanks!  Journal for Nurse Practitioners, 15(4), [phone]. Retrieved February 02, 2018 from http://clinicalkey.com/nursing">  Knee Exercises Ask your health care provider which exercises are safe for you. Do exercises exactly as told by your health care provider and adjust them as directed. It is normal to feel mild stretching, pulling, tightness, or discomfort as you do these  exercises. Stop right away if you feel sudden pain or your pain gets worse. Do not begin these exercises until told by your health care provider. Stretching and range-of-motion exercises These exercises warm up your muscles and joints and improve the movement and flexibility of your knee. These exercises also help to relieve pain andswelling. Knee extension, prone Lie on your abdomen (prone position) on a bed. Place your left / right knee just beyond the edge of the surface so your knee is not on the bed. You can put a towel under your left / right thigh just above your kneecap for comfort. Relax your leg muscles and allow gravity to straighten your knee (extension). You should feel a stretch behind your left / right knee. Hold this position for __________ seconds. Scoot up so your knee is supported between repetitions. Repeat __________ times. Complete this exercise __________ times a day. Knee flexion, active  Lie on your back with both legs straight. If this causes back discomfort, bend your left / right knee so your foot is flat on the floor. Slowly slide your left / right heel back toward your buttocks. Stop when you feel a gentle stretch in the front of your knee or thigh (flexion). Hold this position for __________ seconds. Slowly slide your left / right heel back to the starting position. Repeat __________ times. Complete this exercise __________ times a day. Quadriceps stretch, prone  Lie on your abdomen on a firm surface, such as a bed or padded floor. Bend your left / right  knee and hold your ankle. If you cannot reach your ankle or pant leg, loop a belt around your foot and grab the belt instead. Gently pull your heel toward your buttocks. Your knee should not slide out to the side. You should feel a stretch in the front of your thigh and knee (quadriceps). Hold this position for __________ seconds. Repeat __________ times. Complete this exercise __________ times a day. Hamstring,  supine Lie on your back (supine position). Loop a belt or towel over the ball of your left / right foot. The ball of your foot is on the walking surface, right under your toes. Straighten your left / right knee and slowly pull on the belt to raise your leg until you feel a gentle stretch behind your knee (hamstring). Do not let your knee bend while you do this. Keep your other leg flat on the floor. Hold this position for __________ seconds. Repeat __________ times. Complete this exercise __________ times a day. Strengthening exercises These exercises build strength and endurance in your knee. Endurance is theability to use your muscles for a long time, even after they get tired. Quadriceps, isometric This exercise stretches the muscles in front of your thigh (quadriceps) without moving your knee joint (isometric). Lie on your back with your left / right leg extended and your other knee bent. Put a rolled towel or small pillow under your knee if told by your health care provider. Slowly tense the muscles in the front of your left / right thigh. You should see your kneecap slide up toward your hip or see increased dimpling just above the knee. This motion will push the back of the knee toward the floor. For __________ seconds, hold the muscle as tight as you can without increasing your pain. Relax the muscles slowly and completely. Repeat __________ times. Complete this exercise __________ times a day. Straight leg raises This exercise stretches the muscles in front of your thigh (quadriceps) and the muscles that move your hips (hip flexors). Lie on your back with your left / right leg extended and your other knee bent. Tense the muscles in the front of your left / right thigh. You should see your kneecap slide up or see increased dimpling just above the knee. Your thigh may even shake a bit. Keep these muscles tight as you raise your leg 4-6 inches (10-15 cm) off the floor. Do not let your knee  bend. Hold this position for __________ seconds. Keep these muscles tense as you lower your leg. Relax your muscles slowly and completely after each repetition. Repeat __________ times. Complete this exercise __________ times a day. Hamstring, isometric Lie on your back on a firm surface. Bend your left / right knee about __________ degrees. Dig your left / right heel into the surface as if you are trying to pull it toward your buttocks. Tighten the muscles in the back of your thighs (hamstring) to "dig" as hard as you can without increasing any pain. Hold this position for __________ seconds. Release the tension gradually and allow your muscles to relax completely for __________ seconds after each repetition. Repeat __________ times. Complete this exercise __________ times a day. Hamstring curls If told by your health care provider, do this exercise while wearing ankle weights. Begin with __________ lb weights. Then increase the weight by 1 lb (0.5 kg) increments. Do not wear ankle weights that are more than __________ lb. Lie on your abdomen with your legs straight. Bend your left / right knee  as far as you can without feeling pain. Keep your hips flat against the floor. Hold this position for __________ seconds. Slowly lower your leg to the starting position. Repeat __________ times. Complete this exercise __________ times a day. Squats This exercise strengthens the muscles in front of your thigh and knee (quadriceps). Stand in front of a table, with your feet and knees pointing straight ahead. You may rest your hands on the table for balance but not for support. Slowly bend your knees and lower your hips like you are going to sit in a chair. Keep your weight over your heels, not over your toes. Keep your lower legs upright so they are parallel with the table legs. Do not let your hips go lower than your knees. Do not bend lower than told by your health care provider. If your knee pain  increases, do not bend as low. Hold the squat position for __________ seconds. Slowly push with your legs to return to standing. Do not use your hands to pull yourself to standing. Repeat __________ times. Complete this exercise __________ times a day. Wall slides This exercise strengthens the muscles in front of your thigh and knee (quadriceps). Lean your back against a smooth wall or door, and walk your feet out 18-24 inches (46-61 cm) from it. Place your feet hip-width apart. Slowly slide down the wall or door until your knees bend __________ degrees. Keep your knees over your heels, not over your toes. Keep your knees in line with your hips. Hold this position for __________ seconds. Repeat __________ times. Complete this exercise __________ times a day. Straight leg raises This exercise strengthens the muscles that rotate the leg at the hip and move it away from your body (hip abductors). Lie on your side with your left / right leg in the top position. Lie so your head, shoulder, knee, and hip line up. You may bend your bottom knee to help you keep your balance. Roll your hips slightly forward so your hips are stacked directly over each other and your left / right knee is facing forward. Leading with your heel, lift your top leg 4-6 inches (10-15 cm). You should feel the muscles in your outer hip lifting. Do not let your foot drift forward. Do not let your knee roll toward the ceiling. Hold this position for __________ seconds. Slowly return your leg to the starting position. Let your muscles relax completely after each repetition. Repeat __________ times. Complete this exercise __________ times a day. Straight leg raises This exercise stretches the muscles that move your hips away from the front of the pelvis (hip extensors). Lie on your abdomen on a firm surface. You can put a pillow under your hips if that is more comfortable. Tense the muscles in your buttocks and lift your left /  right leg about 4-6 inches (10-15 cm). Keep your knee straight as you lift your leg. Hold this position for __________ seconds. Slowly lower your leg to the starting position. Let your leg relax completely after each repetition. Repeat __________ times. Complete this exercise __________ times a day. This information is not intended to replace advice given to you by your health care provider. Make sure you discuss any questions you have with your healthcare provider. Document Revised: 02/03/2018 Document Reviewed: 02/03/2018 Elsevier Patient Education  2022 [employer].

## 2020-11-28 ENCOUNTER — Other Ambulatory Visit: Payer: Self-pay | Admitting: *Deleted

## 2020-11-28 DIAGNOSIS — G4730 Sleep apnea, unspecified: Secondary | ICD-10-CM | POA: Diagnosis not present

## 2020-11-28 DIAGNOSIS — M069 Rheumatoid arthritis, unspecified: Secondary | ICD-10-CM | POA: Diagnosis not present

## 2020-11-28 DIAGNOSIS — I10 Essential (primary) hypertension: Secondary | ICD-10-CM | POA: Diagnosis not present

## 2020-11-28 DIAGNOSIS — Z79899 Other long term (current) drug therapy: Secondary | ICD-10-CM

## 2020-11-28 DIAGNOSIS — Z299 Encounter for prophylactic measures, unspecified: Secondary | ICD-10-CM | POA: Diagnosis not present

## 2020-11-28 LAB — CBC WITH DIFFERENTIAL/PLATELET
Absolute Monocytes: 1109 cells/uL — ABNORMAL HIGH (ref 200–950)
Basophils Absolute: 109 cells/uL (ref 0–200)
Basophils Relative: 1.3 %
Eosinophils Absolute: 580 cells/uL — ABNORMAL HIGH (ref 15–500)
Eosinophils Relative: 6.9 %
HCT: 45.3 % (ref 38.5–50.0)
Hemoglobin: 14.8 g/dL (ref 13.2–17.1)
Lymphs Abs: 2394 cells/uL (ref 850–3900)
MCH: 30.2 pg (ref 27.0–33.0)
MCHC: 32.7 g/dL (ref 32.0–36.0)
MCV: 92.4 fL (ref 80.0–100.0)
MPV: 11.1 fL (ref 7.5–12.5)
Monocytes Relative: 13.2 %
Neutro Abs: 4208 cells/uL (ref 1500–7800)
Neutrophils Relative %: 50.1 %
Platelets: 295 10*3/uL (ref 140–400)
RBC: 4.9 10*6/uL (ref 4.20–5.80)
RDW: 13 % (ref 11.0–15.0)
Total Lymphocyte: 28.5 %
WBC: 8.4 10*3/uL (ref 3.8–10.8)

## 2020-11-28 LAB — COMPLETE METABOLIC PANEL WITH GFR
AG Ratio: 1.1 (calc) (ref 1.0–2.5)
ALT: 15 U/L (ref 9–46)
AST: 16 U/L (ref 10–35)
Albumin: 3.9 g/dL (ref 3.6–5.1)
Alkaline phosphatase (APISO): 82 U/L (ref 35–144)
BUN: 18 mg/dL (ref 7–25)
CO2: 26 mmol/L (ref 20–32)
Calcium: 9.1 mg/dL (ref 8.6–10.3)
Chloride: 105 mmol/L (ref 98–110)
Creat: 0.92 mg/dL (ref 0.70–1.30)
Globulin: 3.5 g/dL (calc) (ref 1.9–3.7)
Glucose, Bld: 84 mg/dL (ref 65–99)
Potassium: 4.8 mmol/L (ref 3.5–5.3)
Sodium: 140 mmol/L (ref 135–146)
Total Bilirubin: 0.3 mg/dL (ref 0.2–1.2)
Total Protein: 7.4 g/dL (ref 6.1–8.1)
eGFR: 97 mL/min/{1.73_m2} (ref >=60)

## 2020-11-28 NOTE — Progress Notes (Signed)
Absolute monocytes and eosinophils are slightly elevated. Rest of CBC WNL.  CMP WNL.  We will continue to monitor lab work every 3 months.  No changes at this time.

## 2020-12-04 ENCOUNTER — Other Ambulatory Visit: Payer: Self-pay

## 2020-12-04 MED ORDER — HUMIRA (2 PEN) 40 MG/0.4ML ~~LOC~~ AJKT: 40.0000 mg | AUTO-INJECTOR | SUBCUTANEOUS | 0 refills | Status: DC

## 2020-12-04 NOTE — Telephone Encounter (Signed)
Next Visit: 05/02/2021  Last Visit: 11/27/2020  Last Fill: 09/19/2020  [id number]  Current Dose per office note 11/27/2020: Humira 40 mg sq injections every 14 days  Labs: 11/27/2020 Absolute monocytes and eosinophils are slightly elevated. Rest of CBC WNL.  CMP WNL.  We will continue to monitor lab work every 3 months.  No changes at thistime.   TB Gold: 09/02/2020 Neg    Okay to refill Humira?

## 2020-12-04 NOTE — Telephone Encounter (Signed)
Patient left a voicemail requesting prescription refill of Humira to be sent to [facility].

## 2020-12-11 ENCOUNTER — Other Ambulatory Visit: Payer: Self-pay | Admitting: Rheumatology

## 2020-12-11 MED ORDER — METHOTREXATE SODIUM 2.5 MG PO TABS: ORAL_TABLET | ORAL | 0 refills | Status: DC

## 2020-12-11 NOTE — Telephone Encounter (Signed)
Next Visit: 05/02/2021  Last Visit: 11/27/2020  Last Fill: 09/11/2020  DX: Spondyloarthropathy   Current Dose per office note on 11/27/2020: methotrexate 3 tablets weekly  Labs: 11/27/2020 Absolute monocytes and eosinophils are slightly elevated. Rest of CBC WNL.  CMP WNL.  We will continue to monitor lab work every 3 months.  No changes at this time.   Okay to refill methotrexate?

## 2020-12-11 NOTE — Telephone Encounter (Signed)
Patient needs refill on MTX sent to [facility].

## 2020-12-27 DIAGNOSIS — I10 Essential (primary) hypertension: Secondary | ICD-10-CM | POA: Diagnosis not present

## 2021-01-26 DIAGNOSIS — I10 Essential (primary) hypertension: Secondary | ICD-10-CM | POA: Diagnosis not present

## 2021-02-26 DIAGNOSIS — I10 Essential (primary) hypertension: Secondary | ICD-10-CM | POA: Diagnosis not present

## 2021-03-05 ENCOUNTER — Other Ambulatory Visit: Payer: Self-pay

## 2021-03-05 DIAGNOSIS — Z79899 Other long term (current) drug therapy: Secondary | ICD-10-CM

## 2021-03-05 MED ORDER — HUMIRA (2 PEN) 40 MG/0.4ML ~~LOC~~ AJKT: 40.0000 mg | AUTO-INJECTOR | SUBCUTANEOUS | 0 refills | Status: DC

## 2021-03-05 NOTE — Telephone Encounter (Signed)
Patient called requesting his labwork orders be sent to [facility] in [place].  Patient plans to go on Wednesday, 03/07/21.  Patient also requested prescription refill of Humira to be sent to [facility].  Patient states he will take his last injection tomorrow 03/06/21.

## 2021-03-05 NOTE — Telephone Encounter (Signed)
Lab Orders released.   Next Visit: 05/02/2021  Last Visit: 11/27/2020  Last Fill: 12/04/2020  [id number]  Current Dose per office note 11/27/2020: Humira 40 mg sq injections every 14 days  Labs: 11/27/2020 Absolute monocytes and eosinophils are slightly elevated. Rest of CBC WNL.  CMP WNL.   TB Gold: 09/04/2020 Neg    Okay to refill Humira?

## 2021-03-07 DIAGNOSIS — Z79899 Other long term (current) drug therapy: Secondary | ICD-10-CM | POA: Diagnosis not present

## 2021-03-08 ENCOUNTER — Other Ambulatory Visit: Payer: Self-pay | Admitting: Rheumatology

## 2021-03-08 LAB — CMP14+EGFR
ALT: 18 IU/L (ref 0–44)
AST: 21 IU/L (ref 0–40)
Albumin/Globulin Ratio: 1.1 — ABNORMAL LOW (ref 1.2–2.2)
Albumin: 4.1 g/dL (ref 3.8–4.9)
Alkaline Phosphatase: 94 IU/L (ref 44–121)
BUN/Creatinine Ratio: 15 (ref 9–20)
BUN: 15 mg/dL (ref 6–24)
Bilirubin Total: 0.4 mg/dL (ref 0.0–1.2)
CO2: 26 mmol/L (ref 20–29)
Calcium: 10 mg/dL (ref 8.7–10.2)
Chloride: 101 mmol/L (ref 96–106)
Creatinine, Ser: 1 mg/dL (ref 0.76–1.27)
Globulin, Total: 3.6 g/dL (ref 1.5–4.5)
Glucose: 100 mg/dL — ABNORMAL HIGH (ref 70–99)
Potassium: 5.5 mmol/L — ABNORMAL HIGH (ref 3.5–5.2)
Sodium: 139 mmol/L (ref 134–144)
Total Protein: 7.7 g/dL (ref 6.0–8.5)
eGFR: 87 mL/min/{1.73_m2} (ref >59)

## 2021-03-08 LAB — CBC WITH DIFFERENTIAL/PLATELET
Basophils Absolute: 0.1 10*3/uL (ref 0.0–0.2)
Basos: 1 %
EOS (ABSOLUTE): 0.3 10*3/uL (ref 0.0–0.4)
Eos: 4 %
Hematocrit: 45.6 % (ref 37.5–51.0)
Hemoglobin: 14.7 g/dL (ref 13.0–17.7)
Immature Grans (Abs): 0 10*3/uL (ref 0.0–0.1)
Immature Granulocytes: 0 %
Lymphocytes Absolute: 1.6 10*3/uL (ref 0.7–3.1)
Lymphs: 21 %
MCH: 29.5 pg (ref 26.6–33.0)
MCHC: 32.2 g/dL (ref 31.5–35.7)
MCV: 91 fL (ref 79–97)
Monocytes Absolute: 1 10*3/uL — ABNORMAL HIGH (ref 0.1–0.9)
Monocytes: 13 %
Neutrophils Absolute: 4.6 10*3/uL (ref 1.4–7.0)
Neutrophils: 61 %
Platelets: 282 10*3/uL (ref 150–450)
RBC: 4.99 x10E6/uL (ref 4.14–5.80)
RDW: 12.2 % (ref 11.6–15.4)
WBC: 7.5 10*3/uL (ref 3.4–10.8)

## 2021-03-08 NOTE — Progress Notes (Signed)
CBC is normal.  Potassium is elevated, probably hemolyzed sample.

## 2021-03-08 NOTE — Telephone Encounter (Signed)
Next Visit: 05/02/2021   Last Visit: 11/27/2020   Last Fill: 12/11/2020    DX: Spondyloarthropathy    Current Dose per office note on 11/27/2020: methotrexate 3 tablets weekly   Labs: 03/07/2021 Glucose 100, Potassium 5.5, Albumin/Globulin Ratio 1.1, Monocytes Absolute 1.0   Okay to refill methotrexate?

## 2021-03-14 DIAGNOSIS — M069 Rheumatoid arthritis, unspecified: Secondary | ICD-10-CM | POA: Diagnosis not present

## 2021-03-14 DIAGNOSIS — I10 Essential (primary) hypertension: Secondary | ICD-10-CM | POA: Diagnosis not present

## 2021-03-14 DIAGNOSIS — M5416 Radiculopathy, lumbar region: Secondary | ICD-10-CM | POA: Diagnosis not present

## 2021-03-14 DIAGNOSIS — Z79899 Other long term (current) drug therapy: Secondary | ICD-10-CM | POA: Diagnosis not present

## 2021-03-14 DIAGNOSIS — Z2821 Immunization not carried out because of patient refusal: Secondary | ICD-10-CM | POA: Diagnosis not present

## 2021-03-14 DIAGNOSIS — M25562 Pain in left knee: Secondary | ICD-10-CM | POA: Diagnosis not present

## 2021-03-14 DIAGNOSIS — Z299 Encounter for prophylactic measures, unspecified: Secondary | ICD-10-CM | POA: Diagnosis not present

## 2021-03-19 ENCOUNTER — Other Ambulatory Visit: Payer: Self-pay | Admitting: Rheumatology

## 2021-03-19 NOTE — Telephone Encounter (Signed)
Next Visit: 05/02/2021   Last Visit: 11/27/2020   Last Fill: 03/01/2020   DX: Spondyloarthropathy    Current Dose per office note on 09/01/2587:  folic acid 1 mg daily  Okay to refill folic acid?

## 2021-03-28 DIAGNOSIS — I10 Essential (primary) hypertension: Secondary | ICD-10-CM | POA: Diagnosis not present

## 2021-04-18 NOTE — Progress Notes (Deleted)
Office Visit Note  Patient: Charles Jenkins             Date of Birth: 12/29/1960           MRN: [id number]             PCP: Glenda Chroman, MD Referring: Glenda Chroman, MD Visit Date: 05/02/2021 Occupation: @GUAROCC @  Subjective:  No chief complaint on file.   History of Present Illness: Charles Jenkins is a 60 y.o. male ***   Activities of Daily Living:  Patient reports morning stiffness for *** {minute/hour:19697}.   Patient {ACTIONS;DENIES/REPORTS:21021675::"Denies"} nocturnal pain.  Difficulty dressing/grooming: {ACTIONS;DENIES/REPORTS:21021675::"Denies"} Difficulty climbing stairs: {ACTIONS;DENIES/REPORTS:21021675::"Denies"} Difficulty getting out of chair: {ACTIONS;DENIES/REPORTS:21021675::"Denies"} Difficulty using hands for taps, buttons, cutlery, and/or writing: {ACTIONS;DENIES/REPORTS:21021675::"Denies"}  No Rheumatology ROS completed.   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   Insomnia 03/10/2013   Spinal stenosis of lumbar region 10/28/2012   Sciatica 09/23/2012   Essential hypertension, benign 07/24/2012   Sacroiliitis ([place]) 07/24/2012   Depression 07/24/2012    Past Medical History:  Diagnosis Date   Ankylosing spondylitis (HCC)    Arthritis    Back pain    Chronic right SI joint pain    Hypertension    "only in doctor's offices"   Sleep apnea    per patient     Family History  Problem Relation Age of Onset   Scleroderma Mother    Past  Surgical History:  Procedure Laterality Date   KNEE ARTHROPLASTY Left 2018   LUMBAR LAMINECTOMY/DECOMPRESSION MICRODISCECTOMY Left 10/28/2012   Procedure: MICRO LUMBAR DECOMPRESSION L4 - L5 and L5 - S1 2 LEVELS;  Surgeon: Johnn Hai, MD;  Location: [facility];  Service: Orthopedics;  Laterality: Left;   LUMBAR LAMINECTOMY/DECOMPRESSION MICRODISCECTOMY N/A 05/04/2014   Procedure: MICRO LUMBAR DECOMPRESSION L2-L3, L3-L4;  Surgeon: Johnn Hai, MD;  Location: [facility];  Service: Orthopedics;  Laterality: N/A;   Social History   Social History Narrative   Not on file   Immunization History  Administered Date(s) Administered   Influenza,inj,quad, With Preservative 01/30/2017, 02/10/2018   Influenza-Unspecified 02/27/2012, 01/27/2013, 02/08/2014   Moderna Sars-Covid-2 Vaccination 07/14/2019, 08/09/2019, 12/16/2019   Pneumococcal-Unspecified 02/27/2012     Objective: Vital Signs: There were no vitals taken for this visit.   Physical Exam   Musculoskeletal Exam: ***  CDAI Exam: CDAI Score: -- Patient Global: --; Provider Global: -- Swollen: --; Tender: -- Joint Exam 05/02/2021   No joint exam has been documented for this visit   There is currently no information documented on the homunculus. Go to the Rheumatology activity and complete the homunculus joint exam.  Investigation: No additional findings.  Imaging: No results found.  Recent Labs: Lab Results  Component Value Date   WBC 7.5 03/07/2021   HGB 14.7 03/07/2021   PLT 282 03/07/2021   NA 139 03/07/2021   K 5.5 (H) 03/07/2021   CL 101 03/07/2021   CO2 26 03/07/2021   GLUCOSE 100 (H) 03/07/2021   BUN 15 03/07/2021  CREATININE 1.00 03/07/2021   BILITOT 0.4 03/07/2021   ALKPHOS 94 03/07/2021   AST 21 03/07/2021   ALT 18 03/07/2021   PROT 7.7 03/07/2021   ALBUMIN 4.1 03/07/2021   CALCIUM 10.0 03/07/2021   GFRAA 113 06/07/2020   QFTBGOLDPLUS Negative 09/04/2020    Speciality Comments: Approved for Humira  PAP through [employer] through 04/29/2019.  Procedures:  No procedures performed Allergies: Patient has no known allergies.   Assessment / Plan:     Visit Diagnoses: No diagnosis found.  Orders: No orders of the defined types were placed in this encounter.  No orders of the defined types were placed in this encounter.   Face-to-face time spent with patient was *** minutes. Greater than 50% of time was spent in counseling and coordination of care.  Follow-Up Instructions: No follow-ups on file.   Earnestine Mealing, CMA  Note - This record has been created using Editor, commissioning.  Chart creation errors have been sought, but may not always  have been located. Such creation errors do not reflect on  the standard of medical care.

## 2021-04-19 ENCOUNTER — Telehealth: Payer: Self-pay

## 2021-04-19 NOTE — Telephone Encounter (Signed)
Received notification from [facility] regarding a prior authorization for [place]. Authorization has been APPROVED from 04/19/2021 to 04/28/2022. Approval letter sent to scan center. Updated [employer] with current Prior Authorization information.  Authorization # [id number]

## 2021-04-19 NOTE — Telephone Encounter (Signed)
Per [employer], current Prior Authorization is expiring.  Submitted a Prior Authorization request to [facility] for HUMIRA via CoverMyMeds. Will update once we receive a response.   Key: [place]

## 2021-04-27 DIAGNOSIS — I10 Essential (primary) hypertension: Secondary | ICD-10-CM | POA: Diagnosis not present

## 2021-05-02 ENCOUNTER — Ambulatory Visit: Payer: Medicare Other | Admitting: Rheumatology

## 2021-05-18 NOTE — Progress Notes (Signed)
Office Visit Note  Patient: Charles Jenkins             Date of Birth: 1961/04/02           MRN: [id number]             PCP: Glenda Chroman, MD Referring: Glenda Chroman, MD Visit Date: 05/31/2021 Occupation: @GUAROCC @  Subjective:  Occasional right knee pain  History of Present Illness: Charles Jenkins is a 61 y.o. male with history of spondyloarthropathy and osteoarthritis.  Patient is currently on Humira 40 mg subcutaneous injections every 14 days, methotrexate 3 tablets by mouth once weekly, and folic acid 1 mg daily.  He is tolerating these medications without any side effects and has not missed any doses recently.  He is due for Humira injection today but will require samples since it has not been approved by his insurance yet.  He denies any signs or symptoms of a flare recently.  He denies any increased neck, thoracic, or lumbar discomfort.  He has not had any SI joint pain.  He experiences intermittent discomfort in his right knee joint.  He denies any right knee joint swelling.  He has not had any nocturnal pain in the right knee.  He denies any mechanical symptoms.  He has not had any recent falls or injuries.  He states that his left knee joint is doing well. He has not had any recent infections.     Activities of Daily Living:  Patient reports morning stiffness for 10 minutes  Patient Denies nocturnal pain.  Difficulty dressing/grooming: Denies Difficulty climbing stairs: reports  Difficulty getting out of chair: Reports Difficulty using hands for taps, buttons, cutlery, and/or writing: Denies  Review of Systems  Constitutional:  Negative for fatigue and night sweats.  HENT:  Negative for mouth sores, mouth dryness and nose dryness.   Eyes:  Negative for redness and dryness.  Respiratory:  Negative for shortness of breath and difficulty breathing.   Cardiovascular:  Negative for chest pain, palpitations, hypertension, irregular heartbeat and swelling in legs/feet.   Gastrointestinal:  Negative for constipation and diarrhea.  Endocrine: Negative for increased urination.  Genitourinary:  Negative for painful urination.  Musculoskeletal:  Positive for joint pain, joint pain and morning stiffness. Negative for joint swelling, myalgias, muscle weakness, muscle tenderness and myalgias.  Skin:  Negative for color change, rash, hair loss, nodules/bumps, skin tightness, ulcers and sensitivity to sunlight.  Allergic/Immunologic: Negative for susceptible to infections.  Neurological:  Negative for dizziness, fainting, memory loss, night sweats and weakness.  Hematological:  Negative for swollen glands.  Psychiatric/Behavioral:  Negative for depressed mood and sleep disturbance. The patient is not nervous/anxious.    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   Insomnia 03/10/2013   Spinal stenosis of lumbar region 10/28/2012   Sciatica 09/23/2012   Essential hypertension, benign 07/24/2012   Sacroiliitis ([place]) 07/24/2012   Depression 07/24/2012    Past Medical History:  Diagnosis Date   Ankylosing spondylitis ([place])  Arthritis    Back pain    Chronic right SI joint pain    Hypertension    "only in doctor's offices"   Sleep apnea    per patient     Family History  Problem Relation Age of Onset   Scleroderma Mother    Past Surgical History:  Procedure Laterality Date   KNEE ARTHROPLASTY Left 2018   LUMBAR LAMINECTOMY/DECOMPRESSION MICRODISCECTOMY  Left 10/28/2012   Procedure: MICRO LUMBAR DECOMPRESSION L4 - L5 and L5 - S1 2 LEVELS;  Surgeon: Johnn Hai, MD;  Location: [facility];  Service: Orthopedics;  Laterality: Left;   LUMBAR LAMINECTOMY/DECOMPRESSION MICRODISCECTOMY N/A 05/04/2014   Procedure: MICRO LUMBAR DECOMPRESSION L2-L3, L3-L4;  Surgeon: Johnn Hai, MD;  Location: [facility];  Service: Orthopedics;  Laterality: N/A;   Social History   Social History Narrative   Not on file   Immunization History  Administered Date(s) Administered   Influenza,inj,quad, With Preservative 01/30/2017, 02/10/2018   Influenza-Unspecified 02/27/2012, 01/27/2013, 02/08/2014   Moderna Sars-Covid-2 Vaccination 07/14/2019, 08/09/2019, 12/16/2019   Pneumococcal-Unspecified 02/27/2012     Objective: Vital Signs: BP 135/72 (BP Location: Left Arm, Patient Position: Sitting, Cuff Size: Large)    Pulse 71    Ht 6' (1.829 m)    Wt (!) 352 lb 3.2 oz (159.8 kg)    BMI 47.77 kg/m    Physical Exam Vitals and nursing note reviewed.  Constitutional:      Appearance: He is well-developed.  HENT:     Head: Normocephalic and atraumatic.  Eyes:     Conjunctiva/sclera: Conjunctivae normal.     Pupils: Pupils are equal, round, and reactive to light.  Cardiovascular:     Rate and Rhythm: Normal rate and regular rhythm.     Heart sounds: Normal heart sounds.  Pulmonary:     Effort: Pulmonary effort is normal.     Breath sounds: Normal breath sounds.  Abdominal:     General: Bowel sounds are normal.     Palpations: Abdomen is soft.  Musculoskeletal:     Cervical back: Normal range of motion and neck supple.  Skin:    General: Skin is warm and dry.     Capillary Refill: Capillary refill takes less than 2 seconds.  Neurological:     Mental Status: He is alert and oriented to person, place, and time.  Psychiatric:        Behavior: Behavior normal.     Musculoskeletal Exam: C-spine, thoracic spine, lumbar spine have good range of motion with no  discomfort.  No midline spinal tenderness or SI joint tenderness.  Shoulder joints, elbow joints, wrist joints, MCPs, PIPs, DIPs have good range of motion with no synovitis.  Some PIP and DIP thickening consistent with osteoarthritis of both hands.  Complete fist formation bilaterally.  Hip joints have good range of motion with no groin pain.  No tenderness over trochanteric bursa bilaterally.  Both knee joints have good range of motion with some warmth in the right knee.  Ankle joints have good range of motion with no tenderness or joint swelling.  No evidence of Achilles tendinitis or plantar fasciitis.  CDAI Exam: CDAI Score: -- Patient Global: --; Provider Global: -- Swollen: --; Tender: -- Joint Exam 05/31/2021   No joint exam has been documented for this visit   There is currently no information documented on the homunculus. Go to the Rheumatology activity and complete the homunculus joint exam.  Investigation: No additional findings.  Imaging: No results found.  Recent Labs: Lab Results  Component Value Date   WBC 7.5 03/07/2021   HGB 14.7 03/07/2021   PLT 282 03/07/2021   NA 139 03/07/2021   K 5.5 (H) 03/07/2021   CL 101 03/07/2021   CO2 26 03/07/2021   GLUCOSE 100 (H) 03/07/2021   BUN 15 03/07/2021   CREATININE 1.00 03/07/2021   BILITOT 0.4 03/07/2021   ALKPHOS 94 03/07/2021   AST 21 03/07/2021   ALT 18 03/07/2021   PROT 7.7 03/07/2021   ALBUMIN 4.1 03/07/2021   CALCIUM 10.0 03/07/2021   GFRAA 113 06/07/2020   QFTBGOLDPLUS Negative 09/04/2020    Speciality Comments: Approved for Humira PAP through [employer] through 04/29/2019.  Procedures:  No procedures performed Allergies: Patient has no known allergies.   Assessment / Plan:     Visit Diagnoses: Spondyloarthropathy - History of inflammatory arthritis, sacroiliitis: He has not had any signs or symptoms of a flare since his last office visit.  He has clinically been doing well on Humira 40 mg  subcutaneous injections every 14 days, methotrexate 3 tablets by mouth once weekly, and folic acid 2 mg daily.  He is tolerating these medications without any side effects or injection site reactions.  He has not missed any doses of Humira or methotrexate. He has good range of motion of the C-spine, thoracic spine, and lumbar spine with no discomfort.  No midline spinal tenderness or SI joint tenderness.  He has not had any nocturnal pain.  His morning stiffness has been lasting for about 10 minutes daily.  He has not had any Achilles tendinitis or plantar fasciitis. He has occasional discomfort in the right knee joint.  No recent injuries or falls.  No mechanical symptoms.  He has not had any nocturnal pain.  On examination he has good range of motion of the right knee joint with mild warmth but no effusion.  He declined updated x-rays at this time.  He was encouraged to continue to use Voltaren gel topically as needed for pain relief along with ice, elevation, and compression.  Discussed that if his symptoms persist or worsen he can return to the office and we can update x-rays and possibly perform a cortisone injection at that time. He is not experiencing any other joint pain or joint swelling at this time.  He will remain on Humira and methotrexate as combination therapy.  Humira sample was provided to the patient today since he is due for an injection today.  He was advised to notify us if he develops signs or symptoms of a flare.  He will follow-up in the office in 5 months.  High risk medication use - Humira 40 mg sq injections every 14 days, methotrexate 3 tablets weekly, and folic acid 1 mg daily.  - Plan: CBC with Differential/Platelet, COMPLETE METABOLIC PANEL WITH GFR, QuantiFERON-TB Gold Plus CBC and CMP drawn on 03/07/2021.  He is due to update lab work today.  Orders for CBC and CMP were released.  His next lab work will be due in May and every 3 months to monitor for drug toxicity.  Standing  orders for CBC and CMP remain in place for TB Gold negative on 09/04/2020.  Future order for TB gold will be placed today. He has not had any recent infections.  Discussed the importance of holding Humira and methotrexate if he develops signs or symptoms of an infection and to resume once the infection has completely cleared. Discussed the importance of yearly skin examinations while  on Humira due to the increased risk for nonmelanoma skin cancer.  He voiced understanding.  Screening for tuberculosis - Future order for TB gold placed today. Plan: QuantiFERON-TB Gold Plus  Primary osteoarthritis of both hands: He has some PIP and DIP thickening consistent with osteoarthritis of both hands.  No tenderness or inflammation was noted on examination.  Discussed the importance of joint protection and muscle strengthening.  Primary osteoarthritis of both feet: He is not experiencing any discomfort in his feet at this time.  He has good range of motion of both ankle joints with no tenderness or joint swelling.  No evidence of Achilles tendinitis or plantar fasciitis.  Sacroiliitis [facility]): He has no SI joint tenderness or discomfort at this time.  He has not had any nocturnal pain.  Trigger thumb of both hands: Resolved  S/P arthroscopic surgery of left knee - Dr. Durward Fortes.  Doing well.  He has good range of motion of the left knee joint with no discomfort at this time.  No warmth or effusion of the left knee joint was noted.  DDD (degenerative disc disease), lumbar: He is not experiencing any discomfort in his lower back at this time.  He has no midline spinal tenderness.  No symptoms of radiculopathy.  Primary insomnia: He has been sleeping well at night.  He has not had any nocturnal pain.  Other medical conditions are listed as follows:  History of colon polyps  History of sleep apnea  History of hypertension: Blood pressure was 135/72 today in the office.  Family history of  scleroderma    Orders: Orders Placed This Encounter  Procedures   CBC with Differential/Platelet   COMPLETE METABOLIC PANEL WITH GFR   QuantiFERON-TB Gold Plus   No orders of the defined types were placed in this encounter.    Follow-Up Instructions: Return in 5 months (on 10/28/2021) for Spondyloarthropathy, Osteoarthritis.   Ofilia Neas, PA-C  Note - This record has been created using Dragon software.  Chart creation errors have been sought, but may not always  have been located. Such creation errors do not reflect on  the standard of medical care.

## 2021-05-24 ENCOUNTER — Other Ambulatory Visit: Payer: Self-pay | Admitting: Physician Assistant

## 2021-05-24 NOTE — Telephone Encounter (Signed)
Next Visit: 05/02/2021   Last Visit: 11/27/2020   Last Fill: 03/08/2021   DX: Spondyloarthropathy    Current Dose per office note on 11/27/2020: methotrexate 3 tablets weekly   Labs: 03/07/2021 Glucose 100, Potassium 5.5, Albumin/Globulin Ratio 1.1, Monocytes Absolute 1.0   Okay to refill methotrexate?

## 2021-05-27 DIAGNOSIS — I10 Essential (primary) hypertension: Secondary | ICD-10-CM | POA: Diagnosis not present

## 2021-05-30 ENCOUNTER — Telehealth: Payer: Self-pay | Admitting: Rheumatology

## 2021-05-30 NOTE — Telephone Encounter (Signed)
Patient called the office stating he spoke with a rep with [employer] and they told him that his PA for Humira expired at the end of the year so he is unable to refill his Humira. Patient requests that someone look into this.

## 2021-05-31 ENCOUNTER — Ambulatory Visit (INDEPENDENT_AMBULATORY_CARE_PROVIDER_SITE_OTHER): Payer: Medicare Other | Admitting: Physician Assistant

## 2021-05-31 ENCOUNTER — Encounter: Payer: Self-pay | Admitting: Physician Assistant

## 2021-05-31 ENCOUNTER — Other Ambulatory Visit (HOSPITAL_COMMUNITY): Payer: Self-pay

## 2021-05-31 ENCOUNTER — Other Ambulatory Visit: Payer: Self-pay

## 2021-05-31 VITALS — BP 135/72 | HR 71 | Ht 72.0 in | Wt 352.2 lb

## 2021-05-31 DIAGNOSIS — Z9889 Other specified postprocedural states: Secondary | ICD-10-CM

## 2021-05-31 DIAGNOSIS — F5101 Primary insomnia: Secondary | ICD-10-CM

## 2021-05-31 DIAGNOSIS — M19041 Primary osteoarthritis, right hand: Secondary | ICD-10-CM | POA: Diagnosis not present

## 2021-05-31 DIAGNOSIS — M461 Sacroiliitis, not elsewhere classified: Secondary | ICD-10-CM | POA: Diagnosis not present

## 2021-05-31 DIAGNOSIS — M5136 Other intervertebral disc degeneration, lumbar region: Secondary | ICD-10-CM

## 2021-05-31 DIAGNOSIS — Z8679 Personal history of other diseases of the circulatory system: Secondary | ICD-10-CM | POA: Diagnosis not present

## 2021-05-31 DIAGNOSIS — M47819 Spondylosis without myelopathy or radiculopathy, site unspecified: Secondary | ICD-10-CM

## 2021-05-31 DIAGNOSIS — Z86010 Personal history of colonic polyps: Secondary | ICD-10-CM | POA: Diagnosis not present

## 2021-05-31 DIAGNOSIS — M19042 Primary osteoarthritis, left hand: Secondary | ICD-10-CM

## 2021-05-31 DIAGNOSIS — Z8269 Family history of other diseases of the musculoskeletal system and connective tissue: Secondary | ICD-10-CM

## 2021-05-31 DIAGNOSIS — Z8669 Personal history of other diseases of the nervous system and sense organs: Secondary | ICD-10-CM

## 2021-05-31 DIAGNOSIS — M19071 Primary osteoarthritis, right ankle and foot: Secondary | ICD-10-CM

## 2021-05-31 DIAGNOSIS — M65312 Trigger thumb, left thumb: Secondary | ICD-10-CM

## 2021-05-31 DIAGNOSIS — Z111 Encounter for screening for respiratory tuberculosis: Secondary | ICD-10-CM

## 2021-05-31 DIAGNOSIS — M65311 Trigger thumb, right thumb: Secondary | ICD-10-CM

## 2021-05-31 DIAGNOSIS — Z79899 Other long term (current) drug therapy: Secondary | ICD-10-CM

## 2021-05-31 DIAGNOSIS — M19072 Primary osteoarthritis, left ankle and foot: Secondary | ICD-10-CM

## 2021-05-31 NOTE — Telephone Encounter (Signed)
Faxed Humira prior authorization approval letter to [place].  Fax: [phone] Phone: [phone]  Per [employer] rep, patient's PAP enrollment expired in 04/28/22. Patient states he called our clinic yesterday and was told approval was in place. I advised that prior authorization is approved but he will need to reapply for pt assistance. He has OV today and will pick up sample as well as bring income documents and completed PAP renewal application.  Notified Marissa G, CMA, who will have patient complete today  Knox Saliva, PharmD, MPH, BCPS Clinical Pharmacist (Rheumatology and Pulmonology)

## 2021-05-31 NOTE — Patient Instructions (Signed)
Standing Labs °We placed an order today for your standing lab work.  ° °Please have your standing labs drawn in May and every 3 months  ° ° °If possible, please have your labs drawn 2 weeks prior to your appointment so that the provider can discuss your results at your appointment. ° °Please note that you may see your imaging and lab results in MyChart before we have reviewed them. °We may be awaiting multiple results to interpret others before contacting you. °Please allow our office up to 72 hours to thoroughly review all of the results before contacting the office for clarification of your results. ° °We have open lab daily: °Monday through Thursday from 1:30-4:30 PM and Friday from 1:30-4:00 PM °at the office of Dr. Shaili Deveshwar, [facility].   °Please be advised, all patients with office appointments requiring lab work will take precedent over walk-in lab work.  °If possible, please come for your lab work on Monday and Friday afternoons, as you may experience shorter wait times. °The office is located at [address], [address], [place], [place] [address] °No appointment is necessary.   °Labs are drawn by [facility]. Please bring your co-pay at the time of your lab draw.  You may receive a bill from [facility] for your lab work. ° °Please note if you are on Hydroxychloroquine and and an order has been placed for a Hydroxychloroquine level, you will need to have it drawn 4 hours or more after your last dose. ° °If you wish to have your labs drawn at another location, please call the office 24 hours in advance to send orders. ° °If you have any questions regarding directions or hours of operation,  °please call [phone].   °As a reminder, please drink plenty of water prior to coming for your lab work. Thanks! ° °

## 2021-05-31 NOTE — Progress Notes (Signed)
Medication Samples have been provided to the patient.  Drug name: Humira       Strength: 40mg /0.1mL        Qty: 1  LOT: [id number]  Exp.Date: 06/26/2022  Dosing instructions: Inject 40mg  into the skin every 14 days.

## 2021-06-01 LAB — COMPLETE METABOLIC PANEL WITH GFR
AG Ratio: 1.1 (calc) (ref 1.0–2.5)
ALT: 19 U/L (ref 9–46)
AST: 22 U/L (ref 10–35)
Albumin: 3.9 g/dL (ref 3.6–5.1)
Alkaline phosphatase (APISO): 79 U/L (ref 35–144)
BUN: 13 mg/dL (ref 7–25)
CO2: 26 mmol/L (ref 20–32)
Calcium: 9.6 mg/dL (ref 8.6–10.3)
Chloride: 102 mmol/L (ref 98–110)
Creat: 0.93 mg/dL (ref 0.70–1.30)
Globulin: 3.7 g/dL (calc) (ref 1.9–3.7)
Glucose, Bld: 62 mg/dL — ABNORMAL LOW (ref 65–99)
Potassium: 4.4 mmol/L (ref 3.5–5.3)
Sodium: 139 mmol/L (ref 135–146)
Total Bilirubin: 0.4 mg/dL (ref 0.2–1.2)
Total Protein: 7.6 g/dL (ref 6.1–8.1)
eGFR: 95 mL/min/{1.73_m2} (ref >=60)

## 2021-06-01 LAB — CBC WITH DIFFERENTIAL/PLATELET
Absolute Monocytes: 1822 cells/uL — ABNORMAL HIGH (ref 200–950)
Basophils Absolute: 79 cells/uL (ref 0–200)
Basophils Relative: 0.9 %
Eosinophils Absolute: 537 cells/uL — ABNORMAL HIGH (ref 15–500)
Eosinophils Relative: 6.1 %
HCT: 45.7 % (ref 38.5–50.0)
Hemoglobin: 14.9 g/dL (ref 13.2–17.1)
Lymphs Abs: 2341 cells/uL (ref 850–3900)
MCH: 29.6 pg (ref 27.0–33.0)
MCHC: 32.6 g/dL (ref 32.0–36.0)
MCV: 90.7 fL (ref 80.0–100.0)
MPV: 11.5 fL (ref 7.5–12.5)
Monocytes Relative: 20.7 %
Neutro Abs: 4022 cells/uL (ref 1500–7800)
Neutrophils Relative %: 45.7 %
Platelets: 314 10*3/uL (ref 140–400)
RBC: 5.04 10*6/uL (ref 4.20–5.80)
RDW: 12.8 % (ref 11.0–15.0)
Total Lymphocyte: 26.6 %
WBC: 8.8 10*3/uL (ref 3.8–10.8)

## 2021-06-01 NOTE — Progress Notes (Signed)
Glucose is 62. Rest of CMP WNL. Absolute monocytes and eosinophils are borderline elevated. Rest of CBC WNL.  We will continue to monitor.

## 2021-06-04 NOTE — Telephone Encounter (Signed)
Patient brought income documents to office visit last weke that were from 2020 for his Humira patient assistance application renewal.  Spoke with patient and need for 2021 joint tax return or social security benefits statement for both he and his wife + pay checks from his wife since she works. He will collect and bring to clinic tomorrow morning  Knox Saliva, PharmD, MPH, BCPS Clinical Pharmacist (Rheumatology and Pulmonology)

## 2021-06-05 NOTE — Telephone Encounter (Signed)
Patient faxed in 2021 tax return. Submitted PAP renewal application to [employer] along with signed patient and provider portion, PA approval letter, med list, and insurance card copy and income docs.  Fax: [phone] Phone: [phone]  Knox Saliva, PharmD, MPH, BCPS Clinical Pharmacist (Rheumatology and Pulmonology)

## 2021-06-11 ENCOUNTER — Telehealth: Payer: Self-pay | Admitting: Rheumatology

## 2021-06-11 NOTE — Telephone Encounter (Signed)
Patient left a voicemail stating he is coming up due for his Humira. Patient states he would like a call updating him on the Humira PAP.

## 2021-06-12 NOTE — Telephone Encounter (Signed)
Returned call to patient and advised that [employer] expected turnaround time is 24-48 hours.  He is due for dose on 06/14/21. I recommended he follow up with [employer] with updates if he has not heard back by Friday.  Knox Saliva, PharmD, MPH, BCPS Clinical Pharmacist (Rheumatology and Pulmonology)

## 2021-06-12 NOTE — Telephone Encounter (Signed)
Received a fax from  [place] regarding an approval for [place] patient assistance from 06/12/21 to 04/28/22.   Phone number: [phone]   Called patient to notify that he can call [employer] to schedule shipment of medication to his home. He verbalized understanding and expressed gratitude.  Knox Saliva, PharmD, MPH, BCPS Clinical Pharmacist (Rheumatology and Pulmonology)

## 2021-06-12 NOTE — Telephone Encounter (Signed)
Called [facility] for update on patient's Humira PAP renewal application. Per rep, application still has to be reviewed by patient access specialist. Expected turnaround time is 24-48 hours.  Phone: [phone]  Knox Saliva, PharmD, MPH, BCPS Clinical Pharmacist (Rheumatology and Pulmonology)

## 2021-06-14 DIAGNOSIS — G4733 Obstructive sleep apnea (adult) (pediatric): Secondary | ICD-10-CM | POA: Diagnosis not present

## 2021-06-19 DIAGNOSIS — M25561 Pain in right knee: Secondary | ICD-10-CM | POA: Diagnosis not present

## 2021-06-19 DIAGNOSIS — I10 Essential (primary) hypertension: Secondary | ICD-10-CM | POA: Diagnosis not present

## 2021-06-19 DIAGNOSIS — Z299 Encounter for prophylactic measures, unspecified: Secondary | ICD-10-CM | POA: Diagnosis not present

## 2021-06-19 DIAGNOSIS — D692 Other nonthrombocytopenic purpura: Secondary | ICD-10-CM | POA: Diagnosis not present

## 2021-06-26 DIAGNOSIS — I10 Essential (primary) hypertension: Secondary | ICD-10-CM | POA: Diagnosis not present

## 2021-07-16 DIAGNOSIS — M0560 Rheumatoid arthritis of unspecified site with involvement of other organs and systems: Secondary | ICD-10-CM | POA: Diagnosis not present

## 2021-07-16 DIAGNOSIS — Z79899 Other long term (current) drug therapy: Secondary | ICD-10-CM | POA: Diagnosis not present

## 2021-07-26 DIAGNOSIS — I10 Essential (primary) hypertension: Secondary | ICD-10-CM | POA: Diagnosis not present

## 2021-08-09 DIAGNOSIS — I10 Essential (primary) hypertension: Secondary | ICD-10-CM | POA: Diagnosis not present

## 2021-08-09 DIAGNOSIS — Z0000 Encounter for general adult medical examination without abnormal findings: Secondary | ICD-10-CM | POA: Diagnosis not present

## 2021-08-09 DIAGNOSIS — R5383 Other fatigue: Secondary | ICD-10-CM | POA: Diagnosis not present

## 2021-08-09 DIAGNOSIS — Z7189 Other specified counseling: Secondary | ICD-10-CM | POA: Diagnosis not present

## 2021-08-09 DIAGNOSIS — E7800 Pure hypercholesterolemia, unspecified: Secondary | ICD-10-CM | POA: Diagnosis not present

## 2021-08-09 DIAGNOSIS — Z299 Encounter for prophylactic measures, unspecified: Secondary | ICD-10-CM | POA: Diagnosis not present

## 2021-08-09 DIAGNOSIS — Z79899 Other long term (current) drug therapy: Secondary | ICD-10-CM | POA: Diagnosis not present

## 2021-08-21 ENCOUNTER — Other Ambulatory Visit: Payer: Self-pay | Admitting: Physician Assistant

## 2021-08-21 NOTE — Telephone Encounter (Signed)
Next Visit: 11/08/2021 ? ?Last Visit: 05/31/2021 ? ?Last Fill: 05/24/2021 ? ?DX: Spondyloarthropathy ? ?Current Dose per office note 05/31/2021: methotrexate 3 tablets weekly ? ?Labs: 05/31/2021 Glucose is 62. Rest of CMP WNL. Absolute monocytes and eosinophils are borderline elevated. Rest of CBC WNL.  ? ?Okay to refill MTX?  ?

## 2021-08-23 ENCOUNTER — Other Ambulatory Visit: Payer: Self-pay | Admitting: *Deleted

## 2021-08-23 MED ORDER — HUMIRA (2 PEN) 40 MG/0.4ML ~~LOC~~ AJKT: 40.0000 mg | AUTO-INJECTOR | SUBCUTANEOUS | 0 refills | Status: DC

## 2021-08-23 NOTE — Telephone Encounter (Signed)
Refill request received via fax ? ?Next Visit: 11/08/2021 ? ?Last Visit: 05/31/2021 ? ?Last Fill: 03/05/2021 ? ?[id number] ? ?Current Dose per office note 05/31/2021: Humira 40 mg sq injections every 14 days ? ?Labs:  05/31/2021 Glucose is 62. Rest of CMP WNL. Absolute monocytes and eosinophils are borderline elevated. Rest of CBC WNL ? ?TB Gold: 09/04/2020 Neg  ? ?Okay to refill Humira?  ?

## 2021-08-26 DIAGNOSIS — I10 Essential (primary) hypertension: Secondary | ICD-10-CM | POA: Diagnosis not present

## 2021-08-30 DIAGNOSIS — Z299 Encounter for prophylactic measures, unspecified: Secondary | ICD-10-CM | POA: Diagnosis not present

## 2021-08-30 DIAGNOSIS — M25561 Pain in right knee: Secondary | ICD-10-CM | POA: Diagnosis not present

## 2021-08-30 DIAGNOSIS — M2560 Stiffness of unspecified joint, not elsewhere classified: Secondary | ICD-10-CM | POA: Diagnosis not present

## 2021-08-30 DIAGNOSIS — R269 Unspecified abnormalities of gait and mobility: Secondary | ICD-10-CM | POA: Diagnosis not present

## 2021-08-30 DIAGNOSIS — R262 Difficulty in walking, not elsewhere classified: Secondary | ICD-10-CM | POA: Diagnosis not present

## 2021-09-19 DIAGNOSIS — G4733 Obstructive sleep apnea (adult) (pediatric): Secondary | ICD-10-CM | POA: Diagnosis not present

## 2021-09-25 DIAGNOSIS — I10 Essential (primary) hypertension: Secondary | ICD-10-CM | POA: Diagnosis not present

## 2021-10-08 ENCOUNTER — Other Ambulatory Visit: Payer: Self-pay | Admitting: *Deleted

## 2021-10-08 ENCOUNTER — Telehealth: Payer: Self-pay | Admitting: Rheumatology

## 2021-10-08 DIAGNOSIS — Z79899 Other long term (current) drug therapy: Secondary | ICD-10-CM

## 2021-10-08 DIAGNOSIS — Z111 Encounter for screening for respiratory tuberculosis: Secondary | ICD-10-CM

## 2021-10-08 NOTE — Telephone Encounter (Signed)
Patient called the office requesting lab orders be sent to [facility] in [place]. Patient states he is going Wednesday morning.

## 2021-10-08 NOTE — Addendum Note (Signed)
Addended by: Carole Binning on: 10/08/2021 01:42 PM   Modules accepted: Orders

## 2021-10-08 NOTE — Telephone Encounter (Signed)
Lab Orders released.  

## 2021-10-10 DIAGNOSIS — Z79899 Other long term (current) drug therapy: Secondary | ICD-10-CM | POA: Diagnosis not present

## 2021-10-11 NOTE — Progress Notes (Signed)
CBC and CMP are normal.

## 2021-10-12 DIAGNOSIS — I10 Essential (primary) hypertension: Secondary | ICD-10-CM | POA: Diagnosis not present

## 2021-10-12 DIAGNOSIS — Z299 Encounter for prophylactic measures, unspecified: Secondary | ICD-10-CM | POA: Diagnosis not present

## 2021-10-12 DIAGNOSIS — M25562 Pain in left knee: Secondary | ICD-10-CM | POA: Diagnosis not present

## 2021-10-14 LAB — CBC WITH DIFFERENTIAL/PLATELET
Basophils Absolute: 0.1 10*3/uL (ref 0.0–0.2)
Basos: 1 %
EOS (ABSOLUTE): 0.6 10*3/uL — ABNORMAL HIGH (ref 0.0–0.4)
Eos: 9 %
Hematocrit: 44.6 % (ref 37.5–51.0)
Hemoglobin: 14.7 g/dL (ref 13.0–17.7)
Immature Grans (Abs): 0 10*3/uL (ref 0.0–0.1)
Immature Granulocytes: 0 %
Lymphocytes Absolute: 1.8 10*3/uL (ref 0.7–3.1)
Lymphs: 26 %
MCH: 29.7 pg (ref 26.6–33.0)
MCHC: 33 g/dL (ref 31.5–35.7)
MCV: 90 fL (ref 79–97)
Monocytes Absolute: 0.8 10*3/uL (ref 0.1–0.9)
Monocytes: 11 %
Neutrophils Absolute: 3.7 10*3/uL (ref 1.4–7.0)
Neutrophils: 53 %
Platelets: 266 10*3/uL (ref 150–450)
RBC: 4.95 x10E6/uL (ref 4.14–5.80)
RDW: 12.6 % (ref 11.6–15.4)
WBC: 7 10*3/uL (ref 3.4–10.8)

## 2021-10-14 LAB — CMP14+EGFR
ALT: 17 IU/L (ref 0–44)
AST: 21 IU/L (ref 0–40)
Albumin/Globulin Ratio: 1.1 — ABNORMAL LOW (ref 1.2–2.2)
Albumin: 3.9 g/dL (ref 3.8–4.9)
Alkaline Phosphatase: 88 IU/L (ref 44–121)
BUN/Creatinine Ratio: 12 (ref 9–20)
BUN: 11 mg/dL (ref 6–24)
Bilirubin Total: 0.4 mg/dL (ref 0.0–1.2)
CO2: 23 mmol/L (ref 20–29)
Calcium: 9.8 mg/dL (ref 8.7–10.2)
Chloride: 103 mmol/L (ref 96–106)
Creatinine, Ser: 0.9 mg/dL (ref 0.76–1.27)
Globulin, Total: 3.5 g/dL (ref 1.5–4.5)
Glucose: 98 mg/dL (ref 70–99)
Potassium: 4.9 mmol/L (ref 3.5–5.2)
Sodium: 139 mmol/L (ref 134–144)
Total Protein: 7.4 g/dL (ref 6.0–8.5)
eGFR: 99 mL/min/{1.73_m2} (ref >59)

## 2021-10-14 LAB — QUANTIFERON-TB GOLD PLUS
QuantiFERON Mitogen Value: >10 IU/mL
QuantiFERON Nil Value: 0.04 IU/mL
QuantiFERON TB1 Ag Value: 0.02 IU/mL
QuantiFERON TB2 Ag Value: 0.04 IU/mL
QuantiFERON-TB Gold Plus: NEGATIVE

## 2021-10-15 NOTE — Progress Notes (Signed)
TB gold negative

## 2021-10-25 DIAGNOSIS — I10 Essential (primary) hypertension: Secondary | ICD-10-CM | POA: Diagnosis not present

## 2021-10-31 NOTE — Progress Notes (Deleted)
Office Visit Note  Patient: Charles Jenkins             Date of Birth: 1960/05/05           MRN: [id number]             PCP: Glenda Chroman, MD Referring: Glenda Chroman, MD Visit Date: 11/08/2021 Occupation: '@GUAROCC'$ @  Subjective:  No chief complaint on file.   History of Present Illness: Charles Jenkins is a 61 y.o. male ***   Activities of Daily Living:  Patient reports morning stiffness for *** {minute/hour:19697}.   Patient {ACTIONS;DENIES/REPORTS:21021675::"Denies"} nocturnal pain.  Difficulty dressing/grooming: {ACTIONS;DENIES/REPORTS:21021675::"Denies"} Difficulty climbing stairs: {ACTIONS;DENIES/REPORTS:21021675::"Denies"} Difficulty getting out of chair: {ACTIONS;DENIES/REPORTS:21021675::"Denies"} Difficulty using hands for taps, buttons, cutlery, and/or writing: {ACTIONS;DENIES/REPORTS:21021675::"Denies"}  No Rheumatology ROS completed.   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   Insomnia 03/10/2013   Spinal stenosis of lumbar region 10/28/2012   Sciatica 09/23/2012   Essential hypertension, benign 07/24/2012   Sacroiliitis ([place]) 07/24/2012   Depression 07/24/2012    Past Medical History:  Diagnosis Date   Ankylosing spondylitis ([facility])    Arthritis    Back pain    Chronic right SI joint pain    Hypertension    "only in doctor's offices"   Sleep apnea    per patient     Family History  Problem Relation Age of Onset   Scleroderma Mother    Past  Surgical History:  Procedure Laterality Date   KNEE ARTHROPLASTY Left 2018   LUMBAR LAMINECTOMY/DECOMPRESSION MICRODISCECTOMY Left 10/28/2012   Procedure: MICRO LUMBAR DECOMPRESSION L4 - L5 and L5 - S1 2 LEVELS;  Surgeon: Johnn Hai, MD;  Location: [facility];  Service: Orthopedics;  Laterality: Left;   LUMBAR LAMINECTOMY/DECOMPRESSION MICRODISCECTOMY N/A 05/04/2014   Procedure: MICRO LUMBAR DECOMPRESSION L2-L3, L3-L4;  Surgeon: Johnn Hai, MD;  Location: [facility];  Service: Orthopedics;  Laterality: N/A;   Social History   Social History Narrative   Not on file   Immunization History  Administered Date(s) Administered   Influenza,inj,quad, With Preservative 01/30/2017, 02/10/2018   Influenza-Unspecified 02/27/2012, 01/27/2013, 02/08/2014   Moderna Sars-Covid-2 Vaccination 07/14/2019, 08/09/2019, 12/16/2019   Pneumococcal-Unspecified 02/27/2012     Objective: Vital Signs: There were no vitals taken for this visit.   Physical Exam   Musculoskeletal Exam: ***  CDAI Exam: CDAI Score: -- Patient Global: --; Provider Global: -- Swollen: --; Tender: -- Joint Exam 11/08/2021   No joint exam has been documented for this visit   There is currently no information documented on the homunculus. Go to the Rheumatology activity and complete the homunculus joint exam.  Investigation: No additional findings.  Imaging: No results found.  Recent Labs: Lab Results  Component Value Date   WBC 7.0 10/10/2021   HGB 14.7 10/10/2021   PLT 266 10/10/2021   NA 139 10/10/2021   K 4.9 10/10/2021   CL 103 10/10/2021   CO2 23 10/10/2021   GLUCOSE 98 10/10/2021   BUN 11 10/10/2021   CREATININE  0.90 10/10/2021   BILITOT 0.4 10/10/2021   ALKPHOS 88 10/10/2021   AST 21 10/10/2021   ALT 17 10/10/2021   PROT 7.4 10/10/2021   ALBUMIN 3.9 10/10/2021   CALCIUM 9.8 10/10/2021   GFRAA 113 06/07/2020   QFTBGOLDPLUS Negative 10/10/2021    Speciality Comments: Approved for Humira PAP through  [facility] through 04/29/2019.  Procedures:  No procedures performed Allergies: Patient has no known allergies.   Assessment / Plan:     Visit Diagnoses: No diagnosis found.  Orders: No orders of the defined types were placed in this encounter.  No orders of the defined types were placed in this encounter.   Face-to-face time spent with patient was *** minutes. Greater than 50% of time was spent in counseling and coordination of care.  Follow-Up Instructions: No follow-ups on file.   Earnestine Mealing, CMA  Note - This record has been created using Editor, commissioning.  Chart creation errors have been sought, but may not always  have been located. Such creation errors do not reflect on  the standard of medical care.

## 2021-11-05 ENCOUNTER — Ambulatory Visit: Payer: Medicare Other | Admitting: Rheumatology

## 2021-11-08 ENCOUNTER — Ambulatory Visit: Payer: Medicare Other | Admitting: Rheumatology

## 2021-11-08 DIAGNOSIS — F5101 Primary insomnia: Secondary | ICD-10-CM

## 2021-11-08 DIAGNOSIS — M461 Sacroiliitis, not elsewhere classified: Secondary | ICD-10-CM

## 2021-11-08 DIAGNOSIS — Z8669 Personal history of other diseases of the nervous system and sense organs: Secondary | ICD-10-CM

## 2021-11-08 DIAGNOSIS — Z86010 Personal history of colonic polyps: Secondary | ICD-10-CM

## 2021-11-08 DIAGNOSIS — M65311 Trigger thumb, right thumb: Secondary | ICD-10-CM

## 2021-11-08 DIAGNOSIS — M5136 Other intervertebral disc degeneration, lumbar region: Secondary | ICD-10-CM

## 2021-11-08 DIAGNOSIS — Z8679 Personal history of other diseases of the circulatory system: Secondary | ICD-10-CM

## 2021-11-08 DIAGNOSIS — M19072 Primary osteoarthritis, left ankle and foot: Secondary | ICD-10-CM

## 2021-11-08 DIAGNOSIS — M19041 Primary osteoarthritis, right hand: Secondary | ICD-10-CM

## 2021-11-08 DIAGNOSIS — Z8269 Family history of other diseases of the musculoskeletal system and connective tissue: Secondary | ICD-10-CM

## 2021-11-08 DIAGNOSIS — Z79899 Other long term (current) drug therapy: Secondary | ICD-10-CM

## 2021-11-08 DIAGNOSIS — M47819 Spondylosis without myelopathy or radiculopathy, site unspecified: Secondary | ICD-10-CM

## 2021-11-08 DIAGNOSIS — Z9889 Other specified postprocedural states: Secondary | ICD-10-CM

## 2021-11-13 ENCOUNTER — Other Ambulatory Visit: Payer: Self-pay | Admitting: Physician Assistant

## 2021-11-13 NOTE — Telephone Encounter (Signed)
Next Visit: 12/06/2021   Last Visit: 05/31/2021   Last Fill: 08/21/2021   DX: Spondyloarthropathy   Current Dose per office note 05/31/2021: methotrexate 3 tablets weekly   Labs: 10/10/2021 CBC and CMP are normal.   Okay to refill MTX?

## 2021-11-14 ENCOUNTER — Other Ambulatory Visit: Payer: Self-pay | Admitting: *Deleted

## 2021-11-14 DIAGNOSIS — Z0000 Encounter for general adult medical examination without abnormal findings: Secondary | ICD-10-CM | POA: Diagnosis not present

## 2021-11-14 DIAGNOSIS — Z789 Other specified health status: Secondary | ICD-10-CM | POA: Diagnosis not present

## 2021-11-14 DIAGNOSIS — Z299 Encounter for prophylactic measures, unspecified: Secondary | ICD-10-CM | POA: Diagnosis not present

## 2021-11-14 DIAGNOSIS — M069 Rheumatoid arthritis, unspecified: Secondary | ICD-10-CM | POA: Diagnosis not present

## 2021-11-14 DIAGNOSIS — G4730 Sleep apnea, unspecified: Secondary | ICD-10-CM | POA: Diagnosis not present

## 2021-11-14 DIAGNOSIS — I10 Essential (primary) hypertension: Secondary | ICD-10-CM | POA: Diagnosis not present

## 2021-11-14 MED ORDER — HUMIRA (2 PEN) 40 MG/0.4ML ~~LOC~~ AJKT: 40.0000 mg | AUTO-INJECTOR | SUBCUTANEOUS | 0 refills | Status: DC

## 2021-11-14 NOTE — Telephone Encounter (Signed)
Refill request received via fax from [facility] for Humira.  Next Visit: 12/06/2021   Last Visit: 05/31/2021   Last Fill: 08/23/2021   DX: Spondyloarthropathy   Current Dose per office note 05/31/2021: Humira 40 mg sq injections every 14 days   Labs: 10/10/2021 CBC and CMP are normal.  TB Gold: 10/10/2021 Neg    Okay to refill

## 2021-11-22 NOTE — Progress Notes (Deleted)
Office Visit Note  Patient: Charles Jenkins             Date of Birth: 1961-04-19           MRN: [id number]             PCP: Glenda Chroman, MD Referring: Glenda Chroman, MD Visit Date: 12/06/2021 Occupation: '@GUAROCC'$ @  Subjective:  No chief complaint on file.   History of Present Illness: Charles Jenkins is a 61 y.o. male ***   Activities of Daily Living:  Patient reports morning stiffness for *** {minute/hour:19697}.   Patient {ACTIONS;DENIES/REPORTS:21021675::"Denies"} nocturnal pain.  Difficulty dressing/grooming: {ACTIONS;DENIES/REPORTS:21021675::"Denies"} Difficulty climbing stairs: {ACTIONS;DENIES/REPORTS:21021675::"Denies"} Difficulty getting out of chair: {ACTIONS;DENIES/REPORTS:21021675::"Denies"} Difficulty using hands for taps, buttons, cutlery, and/or writing: {ACTIONS;DENIES/REPORTS:21021675::"Denies"}  No Rheumatology ROS completed.   PMFS History:  Patient Active Problem List   Diagnosis Date Noted  . Other meniscus derangements, posterior horn of medial meniscus, left knee 01/08/2017  . Family history of scleroderma 09/25/2016  . Obesity due to excess calories 09/25/2016  . Primary osteoarthritis of both hands 04/10/2016  . Primary osteoarthritis of both feet 04/10/2016  . High risk medication use 04/09/2016  . 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 Lumbar spinal stenosis 05/05/2014  . 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 Insomnia 03/10/2013  . Spinal stenosis of lumbar region 10/28/2012  . Sciatica 09/23/2012  . Essential hypertension, benign 07/24/2012  . Sacroiliitis ([place]) 07/24/2012  . Depression 07/24/2012    Past Medical History:  Diagnosis Date  . Ankylosing spondylitis ([place])   . Arthritis   . Back pain   . Chronic right SI joint pain   . Hypertension    "only in doctor's offices"  . Sleep apnea    per patient     Family History  Problem Relation Age of Onset  . Scleroderma  Mother    Past Surgical History:  Procedure Laterality Date  . KNEE ARTHROPLASTY Left 2018  . LUMBAR LAMINECTOMY/DECOMPRESSION MICRODISCECTOMY Left 10/28/2012   Procedure: MICRO LUMBAR DECOMPRESSION L4 - L5 and L5 - S1 2 LEVELS;  Surgeon: Johnn Hai, MD;  Location: [facility];  Service: Orthopedics;  Laterality: Left;  . LUMBAR LAMINECTOMY/DECOMPRESSION MICRODISCECTOMY N/A 05/04/2014   Procedure: MICRO LUMBAR DECOMPRESSION L2-L3, L3-L4;  Surgeon: Johnn Hai, MD;  Location: [facility];  Service: Orthopedics;  Laterality: N/A;   Social History   Social History Narrative  . Not on file   Immunization History  Administered Date(s) Administered  . Influenza,inj,quad, With Preservative 01/30/2017, 02/10/2018  . Influenza-Unspecified 02/27/2012, 01/27/2013, 02/08/2014  . Moderna Sars-Covid-2 Vaccination 07/14/2019, 08/09/2019, 12/16/2019  . Pneumococcal-Unspecified 02/27/2012     Objective: Vital Signs: There were no vitals taken for this visit.   Physical Exam   Musculoskeletal Exam: ***  CDAI Exam: CDAI Score: -- Patient Global: --; Provider Global: -- Swollen: --; Tender: -- Joint Exam 12/06/2021   No joint exam has been documented for this visit   There is currently no information documented on the homunculus. Go to the Rheumatology activity and complete the homunculus joint exam.  Investigation: No additional findings.  Imaging: No results found.  Recent Labs: Lab Results  Component Value Date   WBC 7.0 10/10/2021   HGB 14.7 10/10/2021   PLT 266 10/10/2021   NA 139 10/10/2021   K 4.9 10/10/2021   CL 103 10/10/2021   CO2 23 10/10/2021   GLUCOSE 98 10/10/2021   BUN 11 10/10/2021   CREATININE  0.90 10/10/2021   BILITOT 0.4 10/10/2021   ALKPHOS 88 10/10/2021   AST 21 10/10/2021   ALT 17 10/10/2021   PROT 7.4 10/10/2021   ALBUMIN 3.9 10/10/2021   CALCIUM 9.8 10/10/2021   GFRAA 113 06/07/2020   QFTBGOLDPLUS Negative 10/10/2021    Speciality Comments:  Approved for Humira PAP through [facility] through 04/29/2019.  Procedures:  No procedures performed Allergies: Patient has no known allergies.   Assessment / Plan:     Visit Diagnoses: No diagnosis found.  Orders: No orders of the defined types were placed in this encounter.  No orders of the defined types were placed in this encounter.   Face-to-face time spent with patient was *** minutes. Greater than 50% of time was spent in counseling and coordination of care.  Follow-Up Instructions: No follow-ups on file.   Earnestine Mealing, CMA  Note - This record has been created using Editor, commissioning.  Chart creation errors have been sought, but may not always  have been located. Such creation errors do not reflect on  the standard of medical care.

## 2021-11-26 DIAGNOSIS — I10 Essential (primary) hypertension: Secondary | ICD-10-CM | POA: Diagnosis not present

## 2021-12-06 ENCOUNTER — Telehealth: Payer: Self-pay | Admitting: Rheumatology

## 2021-12-06 ENCOUNTER — Ambulatory Visit: Payer: Medicare Other | Admitting: Physician Assistant

## 2021-12-06 DIAGNOSIS — Z8269 Family history of other diseases of the musculoskeletal system and connective tissue: Secondary | ICD-10-CM

## 2021-12-06 DIAGNOSIS — Z86010 Personal history of colonic polyps: Secondary | ICD-10-CM

## 2021-12-06 DIAGNOSIS — M19071 Primary osteoarthritis, right ankle and foot: Secondary | ICD-10-CM

## 2021-12-06 DIAGNOSIS — M47819 Spondylosis without myelopathy or radiculopathy, site unspecified: Secondary | ICD-10-CM

## 2021-12-06 DIAGNOSIS — Z8669 Personal history of other diseases of the nervous system and sense organs: Secondary | ICD-10-CM

## 2021-12-06 DIAGNOSIS — M19041 Primary osteoarthritis, right hand: Secondary | ICD-10-CM

## 2021-12-06 DIAGNOSIS — M19042 Primary osteoarthritis, left hand: Secondary | ICD-10-CM

## 2021-12-06 DIAGNOSIS — Z8679 Personal history of other diseases of the circulatory system: Secondary | ICD-10-CM

## 2021-12-06 DIAGNOSIS — Z9889 Other specified postprocedural states: Secondary | ICD-10-CM

## 2021-12-06 DIAGNOSIS — M461 Sacroiliitis, not elsewhere classified: Secondary | ICD-10-CM

## 2021-12-06 DIAGNOSIS — Z79899 Other long term (current) drug therapy: Secondary | ICD-10-CM

## 2021-12-06 DIAGNOSIS — F5101 Primary insomnia: Secondary | ICD-10-CM

## 2021-12-06 DIAGNOSIS — M65311 Trigger thumb, right thumb: Secondary | ICD-10-CM

## 2021-12-06 DIAGNOSIS — M5136 Other intervertebral disc degeneration, lumbar region: Secondary | ICD-10-CM

## 2021-12-06 NOTE — Telephone Encounter (Signed)
Patient called the office stating he has just tested positive for Covid. Patient states he is due for Humira next week and wants to know when he can take it.

## 2021-12-06 NOTE — Telephone Encounter (Signed)
Spoke with patient and advised patient to hold his Humira and his MTX for 2 weeks after his symptoms resolve. Patient expressed understanding.

## 2021-12-12 NOTE — Progress Notes (Deleted)
Office Visit Note  Patient: Charles Jenkins             Date of Birth: [birth year]           MRN: [id number]             PCP: Glenda Chroman, MD Referring: Glenda Chroman, MD Visit Date: 12/26/2021 Occupation: '@GUAROCC'$ @  Subjective:  No chief complaint on file.   History of Present Illness: Charles Jenkins is a 61 y.o. male ***   Activities of Daily Living:  Patient reports morning stiffness for *** {minute/hour:19697}.   Patient {ACTIONS;DENIES/REPORTS:21021675::"Denies"} nocturnal pain.  Difficulty dressing/grooming: {ACTIONS;DENIES/REPORTS:21021675::"Denies"} Difficulty climbing stairs: {ACTIONS;DENIES/REPORTS:21021675::"Denies"} Difficulty getting out of chair: {ACTIONS;DENIES/REPORTS:21021675::"Denies"} Difficulty using hands for taps, buttons, cutlery, and/or writing: {ACTIONS;DENIES/REPORTS:21021675::"Denies"}  No Rheumatology ROS completed.   PMFS History:  Patient Active Problem List   Diagnosis Date Noted  . Other meniscus derangements, posterior horn of medial meniscus, left knee 01/08/2017  . Family history of scleroderma 09/25/2016  . Obesity due to excess calories 09/25/2016  . Primary osteoarthritis of both hands 04/10/2016  . Primary osteoarthritis of both feet 04/10/2016  . High risk medication use 04/09/2016  . 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 Lumbar spinal stenosis 05/05/2014  . 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 Insomnia 03/10/2013  . Spinal stenosis of lumbar region 10/28/2012  . Sciatica 09/23/2012  . Essential hypertension, benign 07/24/2012  . Sacroiliitis ([place]) 07/24/2012  . Depression 07/24/2012    Past Medical History:  Diagnosis Date  . Ankylosing spondylitis ([place])   . Arthritis   . Back pain   . Chronic right SI joint pain   . Hypertension    "only in doctor's offices"  . Sleep apnea    per patient     Family History  Problem Relation Age of Onset  . Scleroderma  Mother    Past Surgical History:  Procedure Laterality Date  . KNEE ARTHROPLASTY Left 2018  . LUMBAR LAMINECTOMY/DECOMPRESSION MICRODISCECTOMY Left 10/28/2012   Procedure: MICRO LUMBAR DECOMPRESSION L4 - L5 and L5 - S1 2 LEVELS;  Surgeon: Johnn Hai, MD;  Location: [facility];  Service: Orthopedics;  Laterality: Left;  . LUMBAR LAMINECTOMY/DECOMPRESSION MICRODISCECTOMY N/A 05/04/2014   Procedure: MICRO LUMBAR DECOMPRESSION L2-L3, L3-L4;  Surgeon: Johnn Hai, MD;  Location: [facility];  Service: Orthopedics;  Laterality: N/A;   Social History   Social History Narrative  . Not on file   Immunization History  Administered Date(s) Administered  . Influenza,inj,quad, With Preservative 01/30/2017, 02/10/2018  . Influenza-Unspecified 02/27/2012, 01/27/2013, 02/08/2014  . Moderna Sars-Covid-2 Vaccination 07/14/2019, 08/09/2019, 12/16/2019  . Pneumococcal-Unspecified 02/27/2012     Objective: Vital Signs: There were no vitals taken for this visit.   Physical Exam   Musculoskeletal Exam: ***  CDAI Exam: CDAI Score: -- Patient Global: --; Provider Global: -- Swollen: --; Tender: -- Joint Exam 12/26/2021   No joint exam has been documented for this visit   There is currently no information documented on the homunculus. Go to the Rheumatology activity and complete the homunculus joint exam.  Investigation: No additional findings.  Imaging: No results found.  Recent Labs: Lab Results  Component Value Date   WBC 7.0 10/10/2021   HGB 14.7 10/10/2021   PLT 266 10/10/2021   NA 139 10/10/2021   K 4.9 10/10/2021   CL 103 10/10/2021   CO2 23 10/10/2021   GLUCOSE 98 10/10/2021   BUN 11 10/10/2021   CREATININE  0.90 10/10/2021   BILITOT 0.4 10/10/2021   ALKPHOS 88 10/10/2021   AST 21 10/10/2021   ALT 17 10/10/2021   PROT 7.4 10/10/2021   ALBUMIN 3.9 10/10/2021   CALCIUM 9.8 10/10/2021   GFRAA 113 06/07/2020   QFTBGOLDPLUS Negative 10/10/2021    Speciality Comments:  Approved for Humira PAP through [facility] through 04/29/2019.  Procedures:  No procedures performed Allergies: Patient has no known allergies.   Assessment / Plan:     Visit Diagnoses: No diagnosis found.  Orders: No orders of the defined types were placed in this encounter.  No orders of the defined types were placed in this encounter.   Face-to-face time spent with patient was *** minutes. Greater than 50% of time was spent in counseling and coordination of care.  Follow-Up Instructions: No follow-ups on file.   Earnestine Mealing, CMA  Note - This record has been created using Editor, commissioning.  Chart creation errors have been sought, but may not always  have been located. Such creation errors do not reflect on  the standard of medical care.

## 2021-12-26 ENCOUNTER — Ambulatory Visit: Payer: Medicare Other | Admitting: Physician Assistant

## 2021-12-26 DIAGNOSIS — Z79899 Other long term (current) drug therapy: Secondary | ICD-10-CM

## 2021-12-26 DIAGNOSIS — M19041 Primary osteoarthritis, right hand: Secondary | ICD-10-CM

## 2021-12-26 DIAGNOSIS — Z8679 Personal history of other diseases of the circulatory system: Secondary | ICD-10-CM

## 2021-12-26 DIAGNOSIS — Z8269 Family history of other diseases of the musculoskeletal system and connective tissue: Secondary | ICD-10-CM

## 2021-12-26 DIAGNOSIS — Z9889 Other specified postprocedural states: Secondary | ICD-10-CM

## 2021-12-26 DIAGNOSIS — F5101 Primary insomnia: Secondary | ICD-10-CM

## 2021-12-26 DIAGNOSIS — M5136 Other intervertebral disc degeneration, lumbar region: Secondary | ICD-10-CM

## 2021-12-26 DIAGNOSIS — I10 Essential (primary) hypertension: Secondary | ICD-10-CM | POA: Diagnosis not present

## 2021-12-26 DIAGNOSIS — Z86010 Personal history of colonic polyps: Secondary | ICD-10-CM

## 2021-12-26 DIAGNOSIS — M65311 Trigger thumb, right thumb: Secondary | ICD-10-CM

## 2021-12-26 DIAGNOSIS — M461 Sacroiliitis, not elsewhere classified: Secondary | ICD-10-CM

## 2021-12-26 DIAGNOSIS — M19071 Primary osteoarthritis, right ankle and foot: Secondary | ICD-10-CM

## 2021-12-26 DIAGNOSIS — M47819 Spondylosis without myelopathy or radiculopathy, site unspecified: Secondary | ICD-10-CM

## 2021-12-26 DIAGNOSIS — Z8669 Personal history of other diseases of the nervous system and sense organs: Secondary | ICD-10-CM

## 2022-01-11 NOTE — Progress Notes (Signed)
Office Visit Note  Patient: Charles Jenkins             Date of Birth: 08/27/1960           MRN: [id number]             PCP: Glenda Chroman, MD Referring: Glenda Chroman, MD Visit Date: 01/25/2022 Occupation: '@GUAROCC'$ @  Subjective:  Recent COVID-19 infection  History of Present Illness: Charles Jenkins is a 61 y.o. male with history of spondyloarthropathy, DDD, and osteoarthritis.  Patient remains on Humira 40 mg sq injections every 14 days, methotrexate 3 tablets weekly, and folic acid 1 mg daily.  Patient continues to tolerate combination therapy without any side effects.  He states that he was diagnosed with COVID-19 in August 2023 at which time he held Humira and methotrexate for 3 to 4 weeks.  He restarted both medications about 2 weeks ago.  He denies any signs or symptoms of a flare while holding his medications.  He states he continues to experience stiffness after sitting for prolonged periods of time as well as first thing in the morning lasting 10 to 15 minutes.  He continues to take tramadol as needed for pain relief as prescribed by Dr. Woody Seller. He denies any joint swelling at this time.  He denies any Achilles tendinitis or plantar fasciitis.  He denies any rashes or psoriasis.  He denies any eye pain, redness, or sensitivity to light. He is planning on getting the COVID booster, flu shot, and RSV vaccine.  He would also like to get the Shingrix vaccine in the future.     Activities of Daily Living:  Patient reports morning stiffness for 10-15 minutes.   Patient Reports nocturnal pain.  Difficulty dressing/grooming: Denies Difficulty climbing stairs: Reports Difficulty getting out of chair: Reports Difficulty using hands for taps, buttons, cutlery, and/or writing: Denies  Review of Systems  Constitutional:  Negative for fatigue.  HENT:  Negative for mouth sores and mouth dryness.   Eyes:  Negative for dryness.  Respiratory:  Negative for shortness of breath.    Cardiovascular:  Negative for chest pain and palpitations.  Gastrointestinal:  Negative for blood in stool, constipation and diarrhea.  Endocrine: Negative for increased urination.  Genitourinary:  Negative for involuntary urination.  Musculoskeletal:  Positive for joint pain, gait problem, joint pain, joint swelling and morning stiffness. Negative for myalgias, muscle weakness, muscle tenderness and myalgias.  Skin:  Negative for color change, rash, hair loss and sensitivity to sunlight.  Allergic/Immunologic: Negative for susceptible to infections.  Neurological:  Negative for dizziness and headaches.  Hematological:  Negative for swollen glands.  Psychiatric/Behavioral:  Negative for depressed mood and sleep disturbance. The patient is not nervous/anxious.     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   Insomnia 03/10/2013   Spinal stenosis of lumbar region 10/28/2012   Sciatica 09/23/2012   Essential hypertension, benign 07/24/2012   Sacroiliitis ([place]) 07/24/2012   Depression 07/24/2012    Past Medical History:  Diagnosis Date  Ankylosing spondylitis (HCC)    Arthritis    Back pain    Chronic right SI joint pain    Hypertension    "only in doctor's offices"   Sleep apnea    per patient     Family History  Problem Relation Age of Onset   Scleroderma Mother    Past Surgical History:  Procedure Laterality Date   KNEE ARTHROPLASTY Left 2018   LUMBAR  LAMINECTOMY/DECOMPRESSION MICRODISCECTOMY Left 10/28/2012   Procedure: MICRO LUMBAR DECOMPRESSION L4 - L5 and L5 - S1 2 LEVELS;  Surgeon: Johnn Hai, MD;  Location: [facility];  Service: Orthopedics;  Laterality: Left;   LUMBAR LAMINECTOMY/DECOMPRESSION MICRODISCECTOMY N/A 05/04/2014   Procedure: MICRO LUMBAR DECOMPRESSION L2-L3, L3-L4;  Surgeon: Johnn Hai, MD;  Location: [facility];  Service: Orthopedics;  Laterality: N/A;   Social History   Social History Narrative   Not on file   Immunization History  Administered Date(s) Administered   Influenza,inj,quad, With Preservative 01/30/2017, 02/10/2018   Influenza-Unspecified 02/27/2012, 01/27/2013, 02/08/2014   Moderna Sars-Covid-2 Vaccination 07/14/2019, 08/09/2019, 12/16/2019   Pneumococcal-Unspecified 02/27/2012     Objective: Vital Signs: BP (!) 142/86 (BP Location: Left Arm, Patient Position: Sitting, Cuff Size: Large)   Pulse 62   Resp 19   Ht 6' (1.829 m)   Wt (!) 337 lb 12.8 oz (153.2 kg)   BMI 45.81 kg/m    Physical Exam Vitals and nursing note reviewed.  Constitutional:      Appearance: He is well-developed.  HENT:     Head: Normocephalic and atraumatic.  Eyes:     Conjunctiva/sclera: Conjunctivae normal.     Pupils: Pupils are equal, round, and reactive to light.  Cardiovascular:     Rate and Rhythm: Normal rate and regular rhythm.     Heart sounds: Normal heart sounds.  Pulmonary:     Effort: Pulmonary effort is normal.     Breath sounds: Normal breath sounds.  Abdominal:     General: Bowel sounds are normal.     Palpations: Abdomen is soft.  Musculoskeletal:     Cervical back: Normal range of motion and neck supple.  Skin:    General: Skin is warm and dry.     Capillary Refill: Capillary refill takes less than 2 seconds.  Neurological:     Mental Status: He is alert and oriented to person, place, and time.  Psychiatric:        Behavior: Behavior normal.      Musculoskeletal Exam: Patient remained  seated during the examination today.  C-spine has good range of motion with some discomfort and stiffness.  No midline spinal tenderness.  No SI joint tenderness upon palpation.  Shoulder joints, elbow joints, wrist joints, MCPs, PIPs, DIPs have good range of motion with no synovitis.  Some PIP and DIP prominence consistent with osteoarthritis of both hands.  Complete fist formation bilaterally.  Knee joints have some crepitus but no warmth or effusion.  Ankle joints have good range of motion with no tenderness or synovitis.  No evidence of Achilles tendinitis or plantar fasciitis.  No tenderness or synovitis over MTP joints.  CDAI Exam: CDAI Score: -- Patient Global: --; Provider Global: -- Swollen: --; Tender: -- Joint Exam 01/25/2022   No joint exam has been documented for this visit   There is currently no information documented on the homunculus. Go to the Rheumatology activity and complete the homunculus joint exam.  Investigation: No additional findings.  Imaging: No results found.  Recent Labs: Lab Results  Component Value Date   WBC 7.0 10/10/2021   HGB 14.7 10/10/2021   PLT 266 10/10/2021   NA 139 10/10/2021   K 4.9 10/10/2021   CL 103 10/10/2021   CO2 23 10/10/2021   GLUCOSE 98 10/10/2021   BUN 11 10/10/2021   CREATININE 0.90 10/10/2021   BILITOT 0.4 10/10/2021   ALKPHOS 88 10/10/2021   AST 21 10/10/2021   ALT 17 10/10/2021   PROT 7.4 10/10/2021   ALBUMIN 3.9 10/10/2021   CALCIUM 9.8 10/10/2021   GFRAA 113 06/07/2020   QFTBGOLDPLUS Negative 10/10/2021    Speciality Comments: Approved for Humira PAP through [facility] through 04/29/2019.  Procedures:  No procedures performed Allergies: Patient has no known allergies.   Assessment / Plan:     Visit Diagnoses: Spondyloarthropathy - History of inflammatory arthritis, sacroiliitis: He has not had any signs or symptoms of a flare.  He has no midline spinal tenderness on examination today.  No SI joint  tenderness upon palpation.  His morning stiffness has been lasting 10 to 15 minutes daily.  He has no synovitis or dactylitis on exam.  No evidence of Achilles tendinitis or plantar fasciitis.  He has not had any signs or symptoms of uveitis.  Overall he has clinically been doing well on Humira 40 mg subcu injections every 14 days and methotrexate 3 tablets by mouth once weekly.  He has been tolerating combination therapy without any side effects or injection site reactions from Humira.  He was diagnosed with COVID-19 in August 2023 at which time he held Humira and methotrexate for 3-4 weeks.  He did not have any signs or symptoms of a flare while off of therapy and has resumed both medications about 2 weeks ago.  He will remain on combination therapy as prescribed.  He was advised to notify us if he develops signs or symptoms of a flare.  He will follow-up in the office in 5 months or sooner if needed.  High risk medication use - Humira 40 mg sq injections every 14 days, methotrexate 3 tablets weekly, and folic acid 1 mg daily.  - Plan: CBC with Differential/Platelet, COMPLETE METABOLIC PANEL WITH GFR CBC and CMP were drawn on 10/10/2021.  Orders for CBC and CMP were released today.  His next lab work will be due in December and every 3 months to monitor for drug toxicity.  Standing orders for CBC and CMP remain in place. TB Gold negative on 10/10/2021. He was diagnosed with COVID-19 in August 2023.  He held Humira and methotrexate for 3 to 4 weeks until his symptoms have completely resolved.  Discussed the importance of holding Humira and methotrexate if he develops signs or symptoms of an infection and to resume once the infection has completely cleared. He is planning on getting the COVID booster.  Discussed the importance of holding methotrexate 1 week after receiving the booster.  He is also planning on getting the annual flu shot as well as RSV vaccine. In the future he also plans on getting the  Shingrix vaccination.  Primary osteoarthritis of both hands: PIP and DIP thickening consistent with osteoarthritis of both hands.  Complete fist formation noted bilaterally.  No synovitis or dactylitis noted.  Primary osteoarthritis of both feet: He is not experiencing any discomfort in his feet at this time.  He uses Voltaren gel topically as needed for pain relief.  On examination today he has no synovitis or dactylitis.  No evidence of  Achilles tendinitis or plantar fasciitis.  Sacroiliitis [facility]): He has no SI joint tenderness upon palpation.  He has not been experiencing any nocturnal pain.  Trigger thumb of both hands: Resolved  S/P arthroscopic surgery of left knee: Performed by Dr. Durward Fortes.  Good range of motion with no discomfort.  DDD (degenerative disc disease), lumbar: No midline spinal tenderness.  No symptoms of radiculopathy at this time.  Other medical conditions are listed as follows:  Primary insomnia  History of colon polyps  History of sleep apnea  History of hypertension  Orders: Orders Placed This Encounter  Procedures   CBC with Differential/Platelet   COMPLETE METABOLIC PANEL WITH GFR   Meds ordered this encounter  Medications   diclofenac Sodium (VOLTAREN) 1 % GEL    Sig: Apply 2-4 grams to affected joint 4 times daily as needed.    Dispense:  400 g    Refill:  2    Follow-Up Instructions: Return in about 5 months (around 06/27/2022) for Spondyloarthropathy, DDD, Osteoarthritis.   Ofilia Neas, PA-C  Note - This record has been created using Dragon software.  Chart creation errors have been sought, but may not always  have been located. Such creation errors do not reflect on  the standard of medical care.

## 2022-01-23 DIAGNOSIS — M545 Low back pain, unspecified: Secondary | ICD-10-CM | POA: Diagnosis not present

## 2022-01-23 DIAGNOSIS — Z299 Encounter for prophylactic measures, unspecified: Secondary | ICD-10-CM | POA: Diagnosis not present

## 2022-01-23 DIAGNOSIS — D849 Immunodeficiency, unspecified: Secondary | ICD-10-CM | POA: Diagnosis not present

## 2022-01-23 DIAGNOSIS — I10 Essential (primary) hypertension: Secondary | ICD-10-CM | POA: Diagnosis not present

## 2022-01-25 ENCOUNTER — Ambulatory Visit: Payer: Medicare Other | Attending: Rheumatology | Admitting: Physician Assistant

## 2022-01-25 ENCOUNTER — Encounter: Payer: Self-pay | Admitting: Physician Assistant

## 2022-01-25 VITALS — BP 142/86 | HR 62 | Resp 19 | Ht 72.0 in | Wt 337.8 lb

## 2022-01-25 DIAGNOSIS — Z79899 Other long term (current) drug therapy: Secondary | ICD-10-CM

## 2022-01-25 DIAGNOSIS — M5136 Other intervertebral disc degeneration, lumbar region: Secondary | ICD-10-CM

## 2022-01-25 DIAGNOSIS — M461 Sacroiliitis, not elsewhere classified: Secondary | ICD-10-CM | POA: Diagnosis not present

## 2022-01-25 DIAGNOSIS — M47819 Spondylosis without myelopathy or radiculopathy, site unspecified: Secondary | ICD-10-CM

## 2022-01-25 DIAGNOSIS — Z86010 Personal history of colonic polyps: Secondary | ICD-10-CM | POA: Diagnosis not present

## 2022-01-25 DIAGNOSIS — I10 Essential (primary) hypertension: Secondary | ICD-10-CM | POA: Diagnosis not present

## 2022-01-25 DIAGNOSIS — M65311 Trigger thumb, right thumb: Secondary | ICD-10-CM | POA: Diagnosis not present

## 2022-01-25 DIAGNOSIS — M65312 Trigger thumb, left thumb: Secondary | ICD-10-CM

## 2022-01-25 DIAGNOSIS — M19072 Primary osteoarthritis, left ankle and foot: Secondary | ICD-10-CM

## 2022-01-25 DIAGNOSIS — M19071 Primary osteoarthritis, right ankle and foot: Secondary | ICD-10-CM

## 2022-01-25 DIAGNOSIS — Z8669 Personal history of other diseases of the nervous system and sense organs: Secondary | ICD-10-CM | POA: Diagnosis not present

## 2022-01-25 DIAGNOSIS — F5101 Primary insomnia: Secondary | ICD-10-CM

## 2022-01-25 DIAGNOSIS — Z8679 Personal history of other diseases of the circulatory system: Secondary | ICD-10-CM

## 2022-01-25 DIAGNOSIS — Z9889 Other specified postprocedural states: Secondary | ICD-10-CM | POA: Diagnosis not present

## 2022-01-25 DIAGNOSIS — M19041 Primary osteoarthritis, right hand: Secondary | ICD-10-CM | POA: Diagnosis not present

## 2022-01-25 DIAGNOSIS — M19042 Primary osteoarthritis, left hand: Secondary | ICD-10-CM

## 2022-01-25 MED ORDER — DICLOFENAC SODIUM 1 % EX GEL: CUTANEOUS | 2 refills | Status: AC

## 2022-01-25 NOTE — Patient Instructions (Addendum)
Vaccines You are taking a medication(s) that can suppress your immune system.  The following immunizations are recommended: Flu annually Covid-19  Td/Tdap (tetanus, diphtheria, pertussis) every 10 years Pneumonia (Prevnar 15 then Pneumovax 23 at least 1 year apart.  Alternatively, can take Prevnar 20 without needing additional dose) Shingrix: 2 doses from 4 weeks to 6 months apart  Please check with your PCP to make sure you are up to date.   If you have signs or symptoms of an infection or start antibiotics: First, call your PCP for workup of your infection. Hold your medication through the infection, until you complete your antibiotics, and until symptoms resolve if you take the following: Injectable medication (Actemra, Benlysta, Cimzia, Cosentyx, Enbrel, Humira, Kevzara, Orencia, Remicade, Simponi, Stelara, Taltz, Tremfya) Methotrexate Leflunomide (Arava) Mycophenolate (Cellcept) Morrie Sheldon, Olumiant, or Rinvoq   COVID-19 vaccine recommendations:   COVID-19 vaccine is recommended for everyone (unless you are allergic to a vaccine component), even if you are on a medication that suppresses your immune system.   If you are on Methotrexate, Cellcept (mycophenolate), Rinvoq, Morrie Sheldon, and Olumiant- hold the medication for 1 week after each vaccine. Hold Methotrexate for 2 weeks after the single dose COVID-19 vaccine.   Do not take Tylenol or any anti-inflammatory medications (NSAIDs) 24 hours prior to the COVID-19 vaccination.   There is no direct evidence about the efficacy of the COVID-19 vaccine in individuals who are on medications that suppress the immune system.   Even if you are fully vaccinated, and you are on any medications that suppress your immune system, please continue to wear a mask, maintain at least six feet social distance and practice hand hygiene.   If you develop a COVID-19 infection, please contact your PCP or our office to determine if you need monoclonal antibody  infusion.  The booster vaccine is now available for immunocompromised patients.   Please see the following web sites for updated information.   [email or web address]    Standing Labs We placed an order today for your standing lab work.   Please have your standing labs drawn in December and every 3 months   Please have your labs drawn 2 weeks prior to your appointment so that the provider can discuss your lab results at your appointment.  Please note that you may see your imaging and lab results in [facility] before we have reviewed them. We will contact you once all results are reviewed. Please allow our office up to 72 hours to thoroughly review all of the results before contacting the office for clarification of your results.  Lab hours are: Monday through Thursday from 1:30 pm-4:30 pm and Friday from 1:30 pm- 4:00 pm  You may experience shorter wait times on Monday, Thursday or Friday afternoons,.   Effective February 25, 2022, new lab hours will be: Monday through Thursday from 8:00 am -12:30 pm and 1:00 pm-5:00 pm and Friday from 8:00 am-12:00 pm.  Please be advised, all patients with office appointments requiring lab work will take precedent over walk-in lab work.   Labs are drawn by [facility]. Please bring your co-pay at the time of your lab draw.  You may receive a bill from [facility] for your lab work.  Please note if you are on Hydroxychloroquine and and an order has been placed for a Hydroxychloroquine level, you will need to have it drawn 4 hours or more after your last dose.  If you wish to have your labs drawn at another location, please call the office 24  hours in advance so we can fax the orders.  The office is located at [address], [place], [place], [place] [address] No appointment is necessary.    If you have any questions regarding directions or hours of operation,  please call [phone].   As a  reminder, please drink plenty of water prior to coming for your lab work. Thanks!

## 2022-01-26 LAB — COMPLETE METABOLIC PANEL WITH GFR
AG Ratio: 1.1 (calc) (ref 1.0–2.5)
ALT: 14 U/L (ref 9–46)
AST: 17 U/L (ref 10–35)
Albumin: 4.1 g/dL (ref 3.6–5.1)
Alkaline phosphatase (APISO): 87 U/L (ref 35–144)
BUN: 15 mg/dL (ref 7–25)
CO2: 30 mmol/L (ref 20–32)
Calcium: 9.8 mg/dL (ref 8.6–10.3)
Chloride: 101 mmol/L (ref 98–110)
Creat: 0.96 mg/dL (ref 0.70–1.30)
Globulin: 3.9 g/dL (calc) — ABNORMAL HIGH (ref 1.9–3.7)
Glucose, Bld: 92 mg/dL (ref 65–99)
Potassium: 5.3 mmol/L (ref 3.5–5.3)
Sodium: 137 mmol/L (ref 135–146)
Total Bilirubin: 0.5 mg/dL (ref 0.2–1.2)
Total Protein: 8 g/dL (ref 6.1–8.1)
eGFR: 92 mL/min/{1.73_m2} (ref >=60)

## 2022-01-26 LAB — CBC WITH DIFFERENTIAL/PLATELET
Absolute Monocytes: 881 cells/uL (ref 200–950)
Basophils Absolute: 96 cells/uL (ref 0–200)
Basophils Relative: 1.3 %
Eosinophils Absolute: 459 cells/uL (ref 15–500)
Eosinophils Relative: 6.2 %
HCT: 46.4 % (ref 38.5–50.0)
Hemoglobin: 15 g/dL (ref 13.2–17.1)
Lymphs Abs: 1606 cells/uL (ref 850–3900)
MCH: 30 pg (ref 27.0–33.0)
MCHC: 32.3 g/dL (ref 32.0–36.0)
MCV: 92.8 fL (ref 80.0–100.0)
MPV: 10.9 fL (ref 7.5–12.5)
Monocytes Relative: 11.9 %
Neutro Abs: 4359 cells/uL (ref 1500–7800)
Neutrophils Relative %: 58.9 %
Platelets: 302 10*3/uL (ref 140–400)
RBC: 5 10*6/uL (ref 4.20–5.80)
RDW: 12.9 % (ref 11.0–15.0)
Total Lymphocyte: 21.7 %
WBC: 7.4 10*3/uL (ref 3.8–10.8)

## 2022-01-28 NOTE — Progress Notes (Signed)
Globulin is borderline elevated. Rest of CMP WNL.  We will continue to monitor.  CBC WNL.

## 2022-01-29 ENCOUNTER — Other Ambulatory Visit: Payer: Self-pay | Admitting: Physician Assistant

## 2022-01-29 NOTE — Telephone Encounter (Signed)
Next Visit: 06/28/2022  Last Visit: 01/25/2022  Last Fill: 11/13/2021  DX: Spondyloarthropathy  Current Dose per office note 01/25/2022: methotrexate 3 tablets weekly  Labs: 01/25/2022 Globulin is borderline elevated. Rest of CMP WNL.  We will continue to monitor. CBC WNL.    Okay to refill MTX?

## 2022-02-13 ENCOUNTER — Other Ambulatory Visit: Payer: Self-pay | Admitting: *Deleted

## 2022-02-13 MED ORDER — HUMIRA (2 PEN) 40 MG/0.4ML ~~LOC~~ AJKT: 40.0000 mg | AUTO-INJECTOR | SUBCUTANEOUS | 0 refills | Status: DC

## 2022-02-13 NOTE — Telephone Encounter (Signed)
Refill request received via fax from [facility] for Humira  Next Visit: 06/28/2022  Last Visit: 01/25/2022  Last Fill: 11/14/2021  [id number]   Current Dose per office note 01/25/2022:  Humira 40 mg sq injections every 14 days  Labs: 01/25/2022 Globulin is borderline elevated. Rest of CMP WNL. CBC WNL.    TB Gold: 10/10/2021 Neg    Okay to refill Humira?

## 2022-02-25 DIAGNOSIS — I10 Essential (primary) hypertension: Secondary | ICD-10-CM | POA: Diagnosis not present

## 2022-03-27 DIAGNOSIS — I10 Essential (primary) hypertension: Secondary | ICD-10-CM | POA: Diagnosis not present

## 2022-04-07 ENCOUNTER — Other Ambulatory Visit: Payer: Self-pay | Admitting: Physician Assistant

## 2022-04-08 NOTE — Telephone Encounter (Signed)
Next Visit: 06/28/2022  Last Visit: 01/25/2022  Last Fill: 03/19/2021  Dx: Spondyloarthropathy   Current Dose per office note on 7/84/7841: folic acid 1 mg daily.   Okay to refill Folic Acid?

## 2022-04-24 DIAGNOSIS — M67432 Ganglion, left wrist: Secondary | ICD-10-CM | POA: Diagnosis not present

## 2022-04-24 DIAGNOSIS — D849 Immunodeficiency, unspecified: Secondary | ICD-10-CM | POA: Diagnosis not present

## 2022-04-24 DIAGNOSIS — M545 Low back pain, unspecified: Secondary | ICD-10-CM | POA: Diagnosis not present

## 2022-04-24 DIAGNOSIS — Z299 Encounter for prophylactic measures, unspecified: Secondary | ICD-10-CM | POA: Diagnosis not present

## 2022-04-24 DIAGNOSIS — Z789 Other specified health status: Secondary | ICD-10-CM | POA: Diagnosis not present

## 2022-04-24 DIAGNOSIS — I10 Essential (primary) hypertension: Secondary | ICD-10-CM | POA: Diagnosis not present

## 2022-04-26 DIAGNOSIS — I10 Essential (primary) hypertension: Secondary | ICD-10-CM | POA: Diagnosis not present

## 2022-05-07 ENCOUNTER — Other Ambulatory Visit: Payer: Self-pay | Admitting: *Deleted

## 2022-05-07 DIAGNOSIS — Z79899 Other long term (current) drug therapy: Secondary | ICD-10-CM

## 2022-05-14 ENCOUNTER — Other Ambulatory Visit: Payer: Self-pay | Admitting: *Deleted

## 2022-05-14 MED ORDER — HUMIRA (2 PEN) 40 MG/0.4ML ~~LOC~~ AJKT: 40.0000 mg | AUTO-INJECTOR | SUBCUTANEOUS | 0 refills | Status: DC

## 2022-05-14 NOTE — Telephone Encounter (Signed)
Refill request received via fax from [facility] for Humira   Next Visit: 06/28/2022   Last Visit: 01/25/2022   Last Fill: 02/13/2022   [id number]    Current Dose per office note 01/25/2022:  Humira 40 mg sq injections every 14 days   Labs: 01/25/2022 Globulin is borderline elevated. Rest of CMP WNL. CBC WNL.     TB Gold: 10/10/2021 Neg    Patient to update labs this week.    Okay to refill Humira?

## 2022-05-16 DIAGNOSIS — Z79899 Other long term (current) drug therapy: Secondary | ICD-10-CM | POA: Diagnosis not present

## 2022-05-17 LAB — CBC WITH DIFFERENTIAL/PLATELET
Basophils Absolute: 0.1 10*3/uL (ref 0.0–0.2)
Basos: 1 %
EOS (ABSOLUTE): 0.4 10*3/uL (ref 0.0–0.4)
Eos: 5 %
Hematocrit: 44.4 % (ref 37.5–51.0)
Hemoglobin: 14.4 g/dL (ref 13.0–17.7)
Immature Grans (Abs): 0 10*3/uL (ref 0.0–0.1)
Immature Granulocytes: 1 %
Lymphocytes Absolute: 2.1 10*3/uL (ref 0.7–3.1)
Lymphs: 26 %
MCH: 29 pg (ref 26.6–33.0)
MCHC: 32.4 g/dL (ref 31.5–35.7)
MCV: 89 fL (ref 79–97)
Monocytes Absolute: 1 10*3/uL — ABNORMAL HIGH (ref 0.1–0.9)
Monocytes: 12 %
Neutrophils Absolute: 4.4 10*3/uL (ref 1.4–7.0)
Neutrophils: 55 %
Platelets: 305 10*3/uL (ref 150–450)
RBC: 4.97 x10E6/uL (ref 4.14–5.80)
RDW: 12.6 % (ref 11.6–15.4)
WBC: 8 10*3/uL (ref 3.4–10.8)

## 2022-05-17 LAB — CMP14+EGFR
ALT: 14 IU/L (ref 0–44)
AST: 19 IU/L (ref 0–40)
Albumin/Globulin Ratio: 1.2 (ref 1.2–2.2)
Albumin: 4.2 g/dL (ref 3.8–4.9)
Alkaline Phosphatase: 83 IU/L (ref 44–121)
BUN/Creatinine Ratio: 14 (ref 9–20)
BUN: 13 mg/dL (ref 6–24)
Bilirubin Total: 0.6 mg/dL (ref 0.0–1.2)
CO2: 23 mmol/L (ref 20–29)
Calcium: 9.3 mg/dL (ref 8.7–10.2)
Chloride: 99 mmol/L (ref 96–106)
Creatinine, Ser: 0.94 mg/dL (ref 0.76–1.27)
Globulin, Total: 3.6 g/dL (ref 1.5–4.5)
Glucose: 81 mg/dL (ref 70–99)
Potassium: 4.8 mmol/L (ref 3.5–5.2)
Sodium: 136 mmol/L (ref 134–144)
Total Protein: 7.8 g/dL (ref 6.0–8.5)
eGFR: 93 mL/min/{1.73_m2} (ref >59)

## 2022-05-17 NOTE — Progress Notes (Signed)
CBC and CMP normal

## 2022-05-27 DIAGNOSIS — I10 Essential (primary) hypertension: Secondary | ICD-10-CM | POA: Diagnosis not present

## 2022-06-04 ENCOUNTER — Other Ambulatory Visit: Payer: Self-pay | Admitting: Physician Assistant

## 2022-06-04 NOTE — Telephone Encounter (Signed)
Next Visit: 06/28/2022  Last Visit: 01/25/2022  Last Fill: 01/29/2022  DX: Spondyloarthropathy    Current Dose per office note on 01/25/2022: methotrexate 3 tablets weekly   Labs: 05/16/2022 CBC and CMP normal.   Okay to refill MTX?

## 2022-06-14 NOTE — Progress Notes (Signed)
Office Visit Note  Patient: Charles Jenkins             Date of Birth: Dec 23, 1960           MRN: [id number]             PCP: Glenda Chroman, MD Referring: Glenda Chroman, MD Visit Date: 06/28/2022 Occupation: '@GUAROCC'$ @  Subjective:  Medication maintenance   History of Present Illness: Charles Jenkins is a 62 y.o. male with history of spondyloarthropathy, DDD and osteoarthritis. He is on Humira 40 mg injection every 14 days, methotrexate 7.5 mg weekly and folic acid 1 mg QD. He has not missed any doses. He denies any recent illnesses or surgery. Patient complains of pain in his hands, knees and feet today. He has some morning stiffness and the pain is worse with activity. He denies any swelling in the joints. He takes tramadol 50 mg once to twice a day as needed and voltaren gel 1% for the pain. He has history of trigger finger of the thumbs and pointer fingers bilaterally that resolved after steroid injections. He complains of some stiffness in these fingers and he is concerned he has developed trigger finger again. He also mentions occasional pain in the achilles tendon for which he uses Voltaren gel as needed. He works out at [employer] 2-3 times weekly.    Activities of Daily Living:  Patient reports morning stiffness for 30 minutes.   Patient Denies nocturnal pain.  Difficulty dressing/grooming: Reports Difficulty climbing stairs: Reports Difficulty getting out of chair: Reports Difficulty using hands for taps, buttons, cutlery, and/or writing: Reports  Review of Systems  Constitutional:  Negative for fatigue.  HENT:  Negative for mouth sores and mouth dryness.   Eyes:  Negative for dryness.  Respiratory:  Negative for shortness of breath.   Cardiovascular:  Negative for chest pain and palpitations.  Gastrointestinal:  Negative for blood in stool, constipation and diarrhea.  Endocrine: Negative for increased urination.  Genitourinary:  Negative for involuntary urination.   Musculoskeletal:  Positive for joint pain, joint pain, myalgias, morning stiffness and myalgias. Negative for gait problem, joint swelling, muscle weakness and muscle tenderness.  Skin:  Positive for sensitivity to sunlight. Negative for color change, rash and hair loss.  Allergic/Immunologic: Positive for susceptible to infections.  Neurological:  Negative for dizziness and headaches.  Hematological:  Negative for swollen glands.  Psychiatric/Behavioral:  Negative for depressed mood and sleep disturbance. The patient is not nervous/anxious.     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   Insomnia 03/10/2013   Spinal stenosis of lumbar region 10/28/2012   Sciatica 09/23/2012   Essential hypertension, benign 07/24/2012   Sacroiliitis ([facility]) 07/24/2012   Depression 07/24/2012    Past Medical History:  Diagnosis Date   Ankylosing spondylitis (HCC)    Arthritis    Back pain    Chronic right SI joint pain    Hypertension    "only in doctor's offices"   Sleep apnea    per  patient     Family History  Problem Relation Age of Onset   Scleroderma Mother    Past Surgical History:  Procedure Laterality Date   KNEE ARTHROPLASTY Left 2018   LUMBAR LAMINECTOMY/DECOMPRESSION MICRODISCECTOMY Left 10/28/2012   Procedure: MICRO LUMBAR DECOMPRESSION L4 - L5 and L5 - S1 2 LEVELS;  Surgeon: Johnn Hai, MD;  Location: [facility];  Service: Orthopedics;  Laterality: Left;   LUMBAR  LAMINECTOMY/DECOMPRESSION MICRODISCECTOMY N/A 05/04/2014   Procedure: MICRO LUMBAR DECOMPRESSION L2-L3, L3-L4;  Surgeon: Johnn Hai, MD;  Location: [facility];  Service: Orthopedics;  Laterality: N/A;   Social History   Social History Narrative   Not on file   Immunization History  Administered Date(s) Administered   Influenza,inj,quad, With Preservative 01/30/2017, 02/10/2018   Influenza-Unspecified 02/27/2012, 01/27/2013, 02/08/2014   Moderna Sars-Covid-2 Vaccination 07/14/2019, 08/09/2019, 12/16/2019   Pneumococcal-Unspecified 02/27/2012     Objective: Vital Signs: BP (!) 146/82 (BP Location: Left Arm, Patient Position: Sitting, Cuff Size: Large)   Pulse 69   Resp 16   Ht 6' (1.829 m)   Wt (!) 352 lb (159.7 kg)   BMI 47.74 kg/m    Physical Exam   Musculoskeletal Exam: Cervical spine was in good range of motion. Tenderness over the lumbar spine and SI joints. Shoulder joints, elbow joints, wrist joints, MCPs PIPs and DIPs have good range of motion with no synovitis, warmth or effusion. Thickening of the DIP and PIP joints bilaterally. No evidence of thickening of the flexor tendon sheath that would suggest trigger finger. Hip joints, knee joints, ankles, MTPs and PIPs with good range of motion with no synovitis, warmth or effusion. No tenderness over the achilles tendon.   CDAI Exam: CDAI Score: -- Patient Global: --; Provider Global: -- Swollen: --; Tender: -- Joint Exam 06/28/2022   No joint exam has been documented for this visit   There is currently no information documented on the homunculus. Go to the Rheumatology activity and complete the homunculus joint exam.  Investigation: No additional findings.  Imaging: No results found.  Recent Labs: Lab Results  Component Value Date   WBC 8.0 05/16/2022   HGB 14.4 05/16/2022   PLT 305 05/16/2022   NA 136 05/16/2022   K 4.8 05/16/2022   CL 99 05/16/2022   CO2 23 05/16/2022   GLUCOSE 81 05/16/2022   BUN 13  05/16/2022   CREATININE 0.94 05/16/2022   BILITOT 0.6 05/16/2022   ALKPHOS 83 05/16/2022   AST 19 05/16/2022   ALT 14 05/16/2022   PROT 7.8 05/16/2022   ALBUMIN 4.2 05/16/2022   CALCIUM 9.3 05/16/2022   GFRAA 113 06/07/2020   QFTBGOLDPLUS Negative 10/10/2021    Speciality Comments: Approved for Humira PAP through [facility] through 04/29/2019.  Procedures:  No procedures performed Allergies: Patient has no known allergies.   Assessment / Plan:     Visit Diagnoses: Spondyloarthropathy - History of inflammatory arthritis and sacroiliitis. He has not had any signs or symptoms of a flare. No synovitis or dactylitis on exam. No tenderness or evidence of inflammation over the plantar fascia or achilles tendon on exam. No signs or symptoms of uveitis. He is doing well on Humira 40 mg injection every 14 days, methotrexate 7.5 mg weekly and folic acid 1 mg QD. He has not missed any doses of the medications. He has occasional morning stiffness that lasts 10-15 minutes and then some joint pain in the evenings if he does a lot of activity during the day. He  will remain on current therapy.   High risk medication use - Humira 40 mg injection every 14 days, methotrexate 7.5 mg weekly and folic acid 1 mg QD for spondyloarthropathy. He has not missed any doses of medications. CMP and CBC on 05/16/2022 WNL. TB gold negative on 10/10/2021. Repeat CBC and CMP in April. Information on immunization placed in AVS. Patient educated to stop the medications if he develops an infection or needs surgery. Educated patient to establish care with dermatology for routine skin checks given increased risk of skin cancer when taking Humira. He lives in [place] and is going to ask his PCP for recommendations of dermatologists in the area.   Primary osteoarthritis of both hands - He has stiffness in hands for 10-15 minutes in the morning and some pain at the end of the day. He uses tramadol 50 mg and voltaren 1% gel as needed.  Stiffness in the pointer fingers is most likely due to osteoarthritis. DIP and PIP thickening on physical exam. No synovitis, warmth or effusion. No evidence of tendon sheath thickening that would suggest trigger finger.   Primary osteoarthritis of both feet - Pain in his feet at the end of the day. He uses voltaren 1% gel and tramadol 50 mg PRN.   Sacroiliitis ([place]) - Has back pain with prolonged sitting. He had some tenderness over the SI joints. He takes tramadol 50 mg PRN. Recommended continued exercise and dietary changes to assist with weight loss. Also discussed [facility] but patient declined today.   Trigger thumb of both hands - Patient has stiffness of his thumbs and pointer fingers. No evidence of trigger finger on physical exam today. Stiffness is likely due to osteoarthritis.   S/P arthroscopic surgery of left knee - Patient continues to have bilateral knee pain. He has applied for visco injections in the past though there was a significant copay and he is not interested in surgery. There was no warmth or swelling on physical exam. Discussed the importance of regular exercise and weight loss.   DDD (degenerative disc disease), lumbar - He continues to have lumbar spinal pain and had some tenderness on exam. He takes tramadol 50 mg PRN.   Other medical concerns are listed below:  Primary insomnia  History of colon polyps  History of sleep apnea  History of hypertension-blood pressure was elevated at 146/82.  Repeat blood pressure was elevated.  Patient was advised to monitor blood pressure closely and follow-up with the PCP.  Weight loss diet and exercise was also discussed at length.  I offered referral to weight management but patient declined at this time.  Family history of scleroderma  Orders: No orders of the defined types were placed in this encounter.  No orders of the defined types were placed in this encounter.  Face-to-face time spent with  patient was 30 minutes. Greater than 50% of time was spent in counseling and coordination of care.  Follow-Up Instructions: Return in about 5 months (around 11/28/2022) for Spondyloarthropathy.   Bo Merino, MD  Note - This record has been created using Editor, commissioning.  Chart creation errors have been sought, but may not always  have been located. Such creation errors do not reflect on  the standard of medical care.

## 2022-06-25 DIAGNOSIS — D692 Other nonthrombocytopenic purpura: Secondary | ICD-10-CM | POA: Diagnosis not present

## 2022-06-25 DIAGNOSIS — D849 Immunodeficiency, unspecified: Secondary | ICD-10-CM | POA: Diagnosis not present

## 2022-06-25 DIAGNOSIS — I10 Essential (primary) hypertension: Secondary | ICD-10-CM | POA: Diagnosis not present

## 2022-06-25 DIAGNOSIS — M069 Rheumatoid arthritis, unspecified: Secondary | ICD-10-CM | POA: Diagnosis not present

## 2022-06-25 DIAGNOSIS — Z299 Encounter for prophylactic measures, unspecified: Secondary | ICD-10-CM | POA: Diagnosis not present

## 2022-06-26 DIAGNOSIS — I10 Essential (primary) hypertension: Secondary | ICD-10-CM | POA: Diagnosis not present

## 2022-06-28 ENCOUNTER — Ambulatory Visit: Payer: Medicare Other | Attending: Rheumatology | Admitting: Rheumatology

## 2022-06-28 ENCOUNTER — Encounter: Payer: Self-pay | Admitting: Rheumatology

## 2022-06-28 VITALS — BP 146/82 | HR 69 | Resp 16 | Ht 72.0 in | Wt 352.0 lb

## 2022-06-28 DIAGNOSIS — M461 Sacroiliitis, not elsewhere classified: Secondary | ICD-10-CM

## 2022-06-28 DIAGNOSIS — Z9889 Other specified postprocedural states: Secondary | ICD-10-CM

## 2022-06-28 DIAGNOSIS — M65312 Trigger thumb, left thumb: Secondary | ICD-10-CM

## 2022-06-28 DIAGNOSIS — M47819 Spondylosis without myelopathy or radiculopathy, site unspecified: Secondary | ICD-10-CM | POA: Diagnosis not present

## 2022-06-28 DIAGNOSIS — Z86010 Personal history of colonic polyps: Secondary | ICD-10-CM | POA: Diagnosis not present

## 2022-06-28 DIAGNOSIS — M5136 Other intervertebral disc degeneration, lumbar region: Secondary | ICD-10-CM

## 2022-06-28 DIAGNOSIS — M19071 Primary osteoarthritis, right ankle and foot: Secondary | ICD-10-CM

## 2022-06-28 DIAGNOSIS — M19041 Primary osteoarthritis, right hand: Secondary | ICD-10-CM

## 2022-06-28 DIAGNOSIS — M19072 Primary osteoarthritis, left ankle and foot: Secondary | ICD-10-CM

## 2022-06-28 DIAGNOSIS — M65311 Trigger thumb, right thumb: Secondary | ICD-10-CM | POA: Diagnosis not present

## 2022-06-28 DIAGNOSIS — Z8679 Personal history of other diseases of the circulatory system: Secondary | ICD-10-CM

## 2022-06-28 DIAGNOSIS — Z8669 Personal history of other diseases of the nervous system and sense organs: Secondary | ICD-10-CM | POA: Diagnosis not present

## 2022-06-28 DIAGNOSIS — Z79899 Other long term (current) drug therapy: Secondary | ICD-10-CM | POA: Diagnosis not present

## 2022-06-28 DIAGNOSIS — M19042 Primary osteoarthritis, left hand: Secondary | ICD-10-CM

## 2022-06-28 DIAGNOSIS — Z8269 Family history of other diseases of the musculoskeletal system and connective tissue: Secondary | ICD-10-CM

## 2022-06-28 DIAGNOSIS — F5101 Primary insomnia: Secondary | ICD-10-CM | POA: Diagnosis not present

## 2022-06-28 NOTE — Patient Instructions (Signed)
Standing Labs We placed an order today for your standing lab work.   Please have your standing labs drawn in April and every 3 months  Please have your labs drawn 2 weeks prior to your appointment so that the provider can discuss your lab results at your appointment, if possible.  Please note that you may see your imaging and lab results in [facility] before we have reviewed them. We will contact you once all results are reviewed. Please allow our office up to 72 hours to thoroughly review all of the results before contacting the office for clarification of your results.  WALK-IN LAB HOURS  Monday through Thursday from 8:00 am -12:30 pm and 1:00 pm-5:00 pm and Friday from 8:00 am-12:00 pm.  Patients with office visits requiring labs will be seen before walk-in labs.  You may encounter longer than normal wait times. Please allow additional time. Wait times may be shorter on  Monday and Thursday afternoons.  We do not book appointments for walk-in labs. We appreciate your patience and understanding with our staff.   Labs are drawn by [facility]. Please bring your co-pay at the time of your lab draw.  You may receive a bill from [facility] for your lab work.  Please note if you are on Hydroxychloroquine and and an order has been placed for a Hydroxychloroquine level,  you will need to have it drawn 4 hours or more after your last dose.  If you wish to have your labs drawn at another location, please call the office 24 hours in advance so we can fax the orders.  The office is located at [address], [place], [place], [place] [address]   If you have any questions regarding directions or hours of operation,  please call [phone].   As a reminder, please drink plenty of water prior to coming for your lab work. Thanks!   Vaccines You are taking a medication(s) that can suppress your immune system.  The following immunizations are recommended: Flu annually Covid-19  RSV Td/Tdap (tetanus,  diphtheria, pertussis) every 10 years Pneumonia (Prevnar 15 then Pneumovax 23 at least 1 year apart.  Alternatively, can take Prevnar 20 without needing additional dose) Shingrix: 2 doses from 4 weeks to 6 months apart  Please check with your PCP to make sure you are up to date.   If you have signs or symptoms of an infection or start antibiotics: First, call your PCP for workup of your infection. Hold your medication through the infection, until you complete your antibiotics, and until symptoms resolve if you take the following: Injectable medication (Actemra, Benlysta, Cimzia, Cosentyx, Enbrel, Humira, Kevzara, Orencia, Remicade, Simponi, Stelara, Taltz, Tremfya) Methotrexate Leflunomide (Arava) Mycophenolate (Cellcept) Morrie Sheldon, Olumiant, or Rinvoq  Please get an annual skin examination to screen for skin cancer while you are on Humira.

## 2022-07-02 ENCOUNTER — Telehealth: Payer: Self-pay | Admitting: *Deleted

## 2022-07-02 DIAGNOSIS — Z79899 Other long term (current) drug therapy: Secondary | ICD-10-CM

## 2022-07-02 NOTE — Telephone Encounter (Signed)
Referral placed per Dr. Deveshwar 

## 2022-07-12 DIAGNOSIS — G4733 Obstructive sleep apnea (adult) (pediatric): Secondary | ICD-10-CM | POA: Diagnosis not present

## 2022-07-27 DIAGNOSIS — I10 Essential (primary) hypertension: Secondary | ICD-10-CM | POA: Diagnosis not present

## 2022-08-05 ENCOUNTER — Other Ambulatory Visit: Payer: Self-pay | Admitting: *Deleted

## 2022-08-05 MED ORDER — HUMIRA (2 PEN) 40 MG/0.4ML ~~LOC~~ AJKT: 40.0000 mg | AUTO-INJECTOR | SUBCUTANEOUS | 0 refills | Status: DC

## 2022-08-05 NOTE — Telephone Encounter (Signed)
Due to update lab work

## 2022-08-05 NOTE — Telephone Encounter (Signed)
Patient advised he is due to update his lab work. Patient states he will call the office next week to have them released so he may update them.

## 2022-08-05 NOTE — Telephone Encounter (Signed)
Refill request received via fax from [facility] for Humira   Last Fill: 05/14/2022  Labs: 05/16/2022 CBC and CMP normal.   TB Gold: 10/10/2021 Neg    Next Visit: 12/04/2022  Last Visit: 06/28/2022  [id number]   Current Dose per office note 06/28/2022: Humira 40 mg injection every 14 days   Okay to refill Humira?

## 2022-08-15 DIAGNOSIS — L570 Actinic keratosis: Secondary | ICD-10-CM | POA: Diagnosis not present

## 2022-08-15 DIAGNOSIS — Z1283 Encounter for screening for malignant neoplasm of skin: Secondary | ICD-10-CM | POA: Diagnosis not present

## 2022-08-15 DIAGNOSIS — X32XXXA Exposure to sunlight, initial encounter: Secondary | ICD-10-CM | POA: Diagnosis not present

## 2022-08-15 DIAGNOSIS — D225 Melanocytic nevi of trunk: Secondary | ICD-10-CM | POA: Diagnosis not present

## 2022-08-17 ENCOUNTER — Other Ambulatory Visit: Payer: Self-pay | Admitting: Physician Assistant

## 2022-08-19 ENCOUNTER — Other Ambulatory Visit: Payer: Self-pay | Admitting: *Deleted

## 2022-08-19 DIAGNOSIS — Z79899 Other long term (current) drug therapy: Secondary | ICD-10-CM

## 2022-08-20 DIAGNOSIS — Z7189 Other specified counseling: Secondary | ICD-10-CM | POA: Diagnosis not present

## 2022-08-20 DIAGNOSIS — Z299 Encounter for prophylactic measures, unspecified: Secondary | ICD-10-CM | POA: Diagnosis not present

## 2022-08-20 DIAGNOSIS — M069 Rheumatoid arthritis, unspecified: Secondary | ICD-10-CM | POA: Diagnosis not present

## 2022-08-20 DIAGNOSIS — I10 Essential (primary) hypertension: Secondary | ICD-10-CM | POA: Diagnosis not present

## 2022-08-20 DIAGNOSIS — Z0000 Encounter for general adult medical examination without abnormal findings: Secondary | ICD-10-CM | POA: Diagnosis not present

## 2022-08-22 DIAGNOSIS — Z79899 Other long term (current) drug therapy: Secondary | ICD-10-CM | POA: Diagnosis not present

## 2022-08-23 ENCOUNTER — Other Ambulatory Visit: Payer: Self-pay

## 2022-08-23 LAB — CMP14+EGFR
ALT: 17 IU/L (ref 0–44)
AST: 18 IU/L (ref 0–40)
Albumin/Globulin Ratio: 1.1 — ABNORMAL LOW (ref 1.2–2.2)
Albumin: 3.9 g/dL (ref 3.8–4.9)
Alkaline Phosphatase: 101 IU/L (ref 44–121)
BUN/Creatinine Ratio: 14 (ref 9–20)
BUN: 13 mg/dL (ref 6–24)
Bilirubin Total: 0.3 mg/dL (ref 0.0–1.2)
CO2: 21 mmol/L (ref 20–29)
Calcium: 9.5 mg/dL (ref 8.7–10.2)
Chloride: 103 mmol/L (ref 96–106)
Creatinine, Ser: 0.96 mg/dL (ref 0.76–1.27)
Globulin, Total: 3.6 g/dL (ref 1.5–4.5)
Glucose: 95 mg/dL (ref 70–99)
Potassium: 5.3 mmol/L — ABNORMAL HIGH (ref 3.5–5.2)
Sodium: 140 mmol/L (ref 134–144)
Total Protein: 7.5 g/dL (ref 6.0–8.5)
eGFR: 91 mL/min/{1.73_m2} (ref >59)

## 2022-08-23 LAB — CBC WITH DIFFERENTIAL/PLATELET
Basophils Absolute: 0.1 10*3/uL (ref 0.0–0.2)
Basos: 1 %
EOS (ABSOLUTE): 0.4 10*3/uL (ref 0.0–0.4)
Eos: 6 %
Hematocrit: 45 % (ref 37.5–51.0)
Hemoglobin: 14.7 g/dL (ref 13.0–17.7)
Immature Grans (Abs): 0 10*3/uL (ref 0.0–0.1)
Immature Granulocytes: 0 %
Lymphocytes Absolute: 1.7 10*3/uL (ref 0.7–3.1)
Lymphs: 26 %
MCH: 29.8 pg (ref 26.6–33.0)
MCHC: 32.7 g/dL (ref 31.5–35.7)
MCV: 91 fL (ref 79–97)
Monocytes Absolute: 0.8 10*3/uL (ref 0.1–0.9)
Monocytes: 13 %
Neutrophils Absolute: 3.7 10*3/uL (ref 1.4–7.0)
Neutrophils: 54 %
Platelets: 279 10*3/uL (ref 150–450)
RBC: 4.93 x10E6/uL (ref 4.14–5.80)
RDW: 13.1 % (ref 11.6–15.4)
WBC: 6.7 10*3/uL (ref 3.4–10.8)

## 2022-08-23 NOTE — Progress Notes (Signed)
CBC and CMP are normal.  Except potassium is mildly elevated.  Most likely due to hemolyzed sample.  Please advise patient to avoid all potassium supplements.  Please forward results to his PCP.

## 2022-08-23 NOTE — Telephone Encounter (Signed)
Patient contacted the office and states he would like a refill of his methotrexate sent to [facility].  Last Fill: 06/04/2022  Labs: 08/22/2022 CBC and CMP are normal.  Except potassium is mildly elevated.  Most likely due to hemolyzed sample.  Please advise patient to avoid all potassium supplements.  Please forward results to his PCP.   Next Visit: 12/04/2022  Last Visit: 06/28/2022  DX: Spondyloarthropathy   Current Dose per office note 06/28/2022: methotrexate 7.5 mg weekly   Okay to refill Methotrexate?

## 2022-08-26 MED ORDER — METHOTREXATE SODIUM 2.5 MG PO TABS: ORAL_TABLET | ORAL | 0 refills | Status: DC

## 2022-08-27 DIAGNOSIS — I10 Essential (primary) hypertension: Secondary | ICD-10-CM | POA: Diagnosis not present

## 2022-09-27 DIAGNOSIS — I10 Essential (primary) hypertension: Secondary | ICD-10-CM | POA: Diagnosis not present

## 2022-09-27 DIAGNOSIS — D849 Immunodeficiency, unspecified: Secondary | ICD-10-CM | POA: Diagnosis not present

## 2022-09-27 DIAGNOSIS — Z299 Encounter for prophylactic measures, unspecified: Secondary | ICD-10-CM | POA: Diagnosis not present

## 2022-09-27 DIAGNOSIS — M545 Low back pain, unspecified: Secondary | ICD-10-CM | POA: Diagnosis not present

## 2022-10-27 DIAGNOSIS — I10 Essential (primary) hypertension: Secondary | ICD-10-CM | POA: Diagnosis not present

## 2022-11-04 ENCOUNTER — Other Ambulatory Visit: Payer: Self-pay | Admitting: *Deleted

## 2022-11-04 DIAGNOSIS — Z111 Encounter for screening for respiratory tuberculosis: Secondary | ICD-10-CM

## 2022-11-04 DIAGNOSIS — Z79899 Other long term (current) drug therapy: Secondary | ICD-10-CM

## 2022-11-04 MED ORDER — HUMIRA (2 PEN) 40 MG/0.4ML ~~LOC~~ AJKT: 40.0000 mg | AUTO-INJECTOR | SUBCUTANEOUS | 0 refills | Status: DC

## 2022-11-04 NOTE — Telephone Encounter (Signed)
Last Fill: 08/05/2022  Labs: 08/22/2022 CBC and CMP are normal.  Except potassium is mildly elevated.  Most likely due to hemolyzed sample.  Please advise patient to avoid all potassium supplements.  Please forward results to his PCP.   TB Gold: 10/10/2021   TB gold negative [facility] lab due.  Next Visit: 12/04/2022  Last Visit: 06/28/2022  [id number]   Current Dose per office note 06/28/2022: Humira 40 mg injection every 14 days   Okay to refill Humira?

## 2022-11-17 ENCOUNTER — Other Ambulatory Visit: Payer: Self-pay | Admitting: Physician Assistant

## 2022-11-18 NOTE — Telephone Encounter (Signed)
Last Fill: 08/26/2022  Labs: 08/22/2022 CBC and CMP are normal.  Except potassium is mildly elevated.  Most likely due to hemolyzed sample.   Next Visit: 12/04/2022  Last Visit: 06/28/2022  DX: Spondyloarthropathy   Current Dose per office note 06/28/2022: methotrexate 7.5 mg weekly   Patient aware he is due to update labs. Patient will update labs on 11/25/2022.   Okay to refill Methotrexate?

## 2022-11-21 NOTE — Progress Notes (Signed)
Office Visit Note  Patient: Charles Jenkins             Date of Birth: 1960-07-14           MRN: [id number]             PCP: Ignatius Specking, MD Referring: Ignatius Specking, MD Visit Date: 12/04/2022 Occupation: @GUAROCC @  Subjective:  Joint stiffness  History of Present Illness: Charles Jenkins is a 62 y.o. 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  He states he has been having some stiffness in his hands knee joints and ankles.  He has not noticed any joint swelling.  He has been taking Humira and methotrexate without any interruption.  He denies any side effects from the medications.  He has been seeing dermatologist on an annual basis to screen for skin cancer.  He saw Dr. Margo Aye recently.  He continues to have difficulty with climbing stairs and getting up from the chair.    Activities of Daily Living:  Patient reports morning stiffness for 15-20 minutes.   Patient Reports nocturnal pain.  Difficulty dressing/grooming: Reports Difficulty climbing stairs: Reports Difficulty getting out of chair: Reports Difficulty using hands for taps, buttons, cutlery, and/or writing: Reports  Review of Systems  Constitutional:  Negative for fatigue.  HENT:  Negative for mouth sores and mouth dryness.   Eyes:  Negative for dryness.  Respiratory:  Negative for shortness of breath.   Cardiovascular:  Negative for chest pain and palpitations.  Gastrointestinal:  Negative for blood in stool, constipation and diarrhea.  Endocrine: Negative for increased urination.  Genitourinary:  Negative for involuntary urination.  Musculoskeletal:  Positive for joint pain, gait problem, joint pain and morning stiffness. Negative for joint swelling, myalgias, muscle weakness, muscle tenderness and myalgias.  Skin:  Negative for color change, rash and sensitivity to sunlight.  Allergic/Immunologic: Negative for susceptible to infections.  Neurological:  Negative for dizziness and headaches.  Hematological:  Negative for  swollen glands.  Psychiatric/Behavioral:  Negative for depressed mood and sleep disturbance. The patient is not nervous/anxious.     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   Insomnia 03/10/2013   Spinal stenosis of lumbar region 10/28/2012   Sciatica 09/23/2012   Essential hypertension, benign 07/24/2012   Sacroiliitis (HCC) 07/24/2012   Depression 07/24/2012    Past Medical History:  Diagnosis Date   Ankylosing spondylitis (HCC)    Arthritis    Back pain    Chronic right SI joint pain    Hypertension    "only in doctor's offices"   Sleep apnea    per patient     Family History  Problem Relation Age of Onset   Scleroderma Mother    Past Surgical History:  Procedure Laterality Date   KNEE ARTHROPLASTY Left 2018   LUMBAR LAMINECTOMY/DECOMPRESSION MICRODISCECTOMY Left 10/28/2012   Procedure: MICRO LUMBAR DECOMPRESSION L4 - L5 and L5 - S1 2 LEVELS;  Surgeon: Javier Docker, MD;  Location: [facility];  Service: Orthopedics;  Laterality: Left;  LUMBAR LAMINECTOMY/DECOMPRESSION MICRODISCECTOMY N/A 05/04/2014   Procedure: MICRO LUMBAR DECOMPRESSION L2-L3, L3-L4;  Surgeon: Javier Docker, MD;  Location: [facility];  Service: Orthopedics;  Laterality: N/A;   Social History   Social History Narrative   Not on file   Immunization History  Administered Date(s) Administered   Influenza,inj,quad, With Preservative 01/30/2017, 02/10/2018   Influenza-Unspecified 02/27/2012, 01/27/2013,  02/08/2014   Moderna Sars-Covid-2 Vaccination 07/14/2019, 08/09/2019, 12/16/2019   Pneumococcal-Unspecified 02/27/2012     Objective: Vital Signs: BP 138/77 (BP Location: Left Arm, Patient Position: Sitting, Cuff Size: Large)   Pulse 62   Resp 16   Ht 6' (1.829 m)   Wt (!) 350 lb 3.2 oz (158.8 kg)   BMI 47.50 kg/m    Physical Exam Vitals and nursing note reviewed.  Constitutional:      Appearance: He is well-developed.  HENT:     Head: Normocephalic and atraumatic.  Eyes:     Conjunctiva/sclera: Conjunctivae normal.     Pupils: Pupils are equal, round, and reactive to light.  Cardiovascular:     Rate and Rhythm: Normal rate and regular rhythm.     Heart sounds: Normal heart sounds.  Pulmonary:     Effort: Pulmonary effort is normal.     Breath sounds: Normal breath sounds.  Abdominal:     General: Bowel sounds are normal.     Palpations: Abdomen is soft.  Musculoskeletal:     Cervical back: Normal range of motion and neck supple.  Skin:    General: Skin is warm and dry.     Capillary Refill: Capillary refill takes less than 2 seconds.  Neurological:     Mental Status: He is alert and oriented to person, place, and time.  Psychiatric:        Behavior: Behavior normal.      Musculoskeletal Exam: He could range of motion of the cervical spine.  Thoracic and lumbar spine were difficult to assess due to body habitus.  Shoulders were in good range of motion.  He had mild contracture in his bilateral elbows with no synovitis.  Wrist joints, MCPs PIPs and DIPs with good range of motion with no synovitis.  Bilateral PIP and DIP thickening was noted.  Hip joints and knee joints with good range of motion.  He had no tenderness over ankles or MTPs.  CDAI Exam: CDAI Score: -- Patient Global: --; Provider Global: -- Swollen: --; Tender: -- Joint Exam 12/04/2022   No joint exam has been documented for this visit   There is currently no information documented on the homunculus.  Go to the Rheumatology activity and complete the homunculus joint exam.  Investigation: No additional findings.  Imaging: No results found.  Recent Labs: Lab Results  Component Value Date   WBC 6.7 08/22/2022   HGB 14.7 08/22/2022   PLT 279 08/22/2022   NA 140 08/22/2022   K 5.3 (H) 08/22/2022   CL 103 08/22/2022   CO2 21 08/22/2022   GLUCOSE 95 08/22/2022   BUN 13 08/22/2022   CREATININE 0.96 08/22/2022   BILITOT 0.3 08/22/2022   ALKPHOS 101 08/22/2022   AST 18 08/22/2022   ALT 17 08/22/2022   PROT 7.5 08/22/2022   ALBUMIN 3.9 08/22/2022   CALCIUM 9.5 08/22/2022   GFRAA 113 06/07/2020   QFTBGOLDPLUS Negative 10/10/2021    Speciality Comments: Approved for Humira PAP through [facility] through 04/29/2019.  Procedures:  No procedures performed Allergies: Patient has no known allergies.   Assessment / Plan:  Visit Diagnoses: Spondyloarthropathy - History of inflammatory arthritis and sacroiliitis.  Patient complains of ongoing joint pain and stiffness.  No synovitis was noted on the examination.  He appears to be experiencing some discomfort due to underlying osteoarthritis.  He is on Humira 40 mg subcu every other week and methotrexate 7.5 mg weekly with folic acid.  He had no interruption in the treatment.  High risk medication use - Humira 40 mg injection every 14 days, methotrexate 7.5 mg weekly and folic acid 1 mg QD for spondyloarthropathy.  August 22, 2022 CBC and CMP were normal.  October 10, 2021 TB Gold was negative.  Patient had CMP recently with his PCP.  Will check CBC and TB Gold today.  He was advised to get labs every 3 months.  Information regarding physician was placed in the AVS.  He was advised to hold Humira if he develops an infection resume after the infection resolves.  Use of sunscreen and sun protection was discussed.  He has been getting annual skin examination by his dermatologist.  Primary osteoarthritis of both hands-he had bilateral PIP and  DIP thickening with no synovitis.  Primary osteoarthritis of both feet-he continues to have some stiffness in his feet.  Sacroiliitis (HCC) -he gets stiff after prolonged sitting.  He has some lower back pain and SI joint discomfort.  He takes tramadol 50 mg PRN.  Trigger thumb of both hands-he has intermittent triggering.  S/P arthroscopic surgery of left knee-he complains of his stiffness.  No warmth swelling or effusion was noted.  DDD (degenerative disc disease), lumbar-he continues to have chronic pain and discomfort in his lower back.  Primary insomnia  History of sleep apnea  History of colon polyps  History of hypertension-blood pressure was normal at 138/77 today.  Family history of scleroderma  Orders: Orders Placed This Encounter  Procedures   QuantiFERON-TB Gold Plus   CBC with Differential/Platelet   No orders of the defined types were placed in this encounter.   .  Follow-Up Instructions: Return in about 5 months (around 05/06/2023).   Pollyann Savoy, MD  Note - This record has been created using Animal nutritionist.  Chart creation errors have been sought, but may not always  have been located. Such creation errors do not reflect on  the standard of medical care.

## 2022-11-22 DIAGNOSIS — I10 Essential (primary) hypertension: Secondary | ICD-10-CM | POA: Diagnosis not present

## 2022-11-22 DIAGNOSIS — E7800 Pure hypercholesterolemia, unspecified: Secondary | ICD-10-CM | POA: Diagnosis not present

## 2022-11-22 DIAGNOSIS — Z79899 Other long term (current) drug therapy: Secondary | ICD-10-CM | POA: Diagnosis not present

## 2022-11-22 DIAGNOSIS — Z0000 Encounter for general adult medical examination without abnormal findings: Secondary | ICD-10-CM | POA: Diagnosis not present

## 2022-11-22 DIAGNOSIS — Z299 Encounter for prophylactic measures, unspecified: Secondary | ICD-10-CM | POA: Diagnosis not present

## 2022-11-22 DIAGNOSIS — R5383 Other fatigue: Secondary | ICD-10-CM | POA: Diagnosis not present

## 2022-11-25 DIAGNOSIS — E7800 Pure hypercholesterolemia, unspecified: Secondary | ICD-10-CM | POA: Diagnosis not present

## 2022-11-25 DIAGNOSIS — R5383 Other fatigue: Secondary | ICD-10-CM | POA: Diagnosis not present

## 2022-11-25 DIAGNOSIS — Z79899 Other long term (current) drug therapy: Secondary | ICD-10-CM | POA: Diagnosis not present

## 2022-11-26 ENCOUNTER — Other Ambulatory Visit: Payer: Self-pay | Admitting: *Deleted

## 2022-11-26 DIAGNOSIS — Z79899 Other long term (current) drug therapy: Secondary | ICD-10-CM

## 2022-11-26 DIAGNOSIS — Z111 Encounter for screening for respiratory tuberculosis: Secondary | ICD-10-CM

## 2022-11-27 DIAGNOSIS — I10 Essential (primary) hypertension: Secondary | ICD-10-CM | POA: Diagnosis not present

## 2022-11-29 ENCOUNTER — Telehealth: Payer: Self-pay | Admitting: *Deleted

## 2022-11-29 NOTE — Telephone Encounter (Signed)
Labs received from: Dr. Sherril Croon  Drawn on: 11/25/2022  Reviewed by: Sherron Ales, PA-C  Labs drawn:CMP, Lipid Panel, TSH, PSA  Results: WNL  [facility] was unable to collect sufficient specimen to perform CBC.   Patient is on MTX 3 tabs po weekly and Humira 40 mg SQ every 14 days.

## 2022-12-04 ENCOUNTER — Encounter: Payer: Self-pay | Admitting: Rheumatology

## 2022-12-04 ENCOUNTER — Ambulatory Visit: Payer: Medicare Other | Attending: Rheumatology | Admitting: Rheumatology

## 2022-12-04 VITALS — BP 138/77 | HR 62 | Resp 16 | Ht 72.0 in | Wt 350.2 lb

## 2022-12-04 DIAGNOSIS — Z86010 Personal history of colonic polyps: Secondary | ICD-10-CM | POA: Diagnosis not present

## 2022-12-04 DIAGNOSIS — M65311 Trigger thumb, right thumb: Secondary | ICD-10-CM | POA: Diagnosis not present

## 2022-12-04 DIAGNOSIS — Z8679 Personal history of other diseases of the circulatory system: Secondary | ICD-10-CM | POA: Diagnosis not present

## 2022-12-04 DIAGNOSIS — M19042 Primary osteoarthritis, left hand: Secondary | ICD-10-CM

## 2022-12-04 DIAGNOSIS — F5101 Primary insomnia: Secondary | ICD-10-CM

## 2022-12-04 DIAGNOSIS — M0560 Rheumatoid arthritis of unspecified site with involvement of other organs and systems: Secondary | ICD-10-CM | POA: Diagnosis not present

## 2022-12-04 DIAGNOSIS — M19071 Primary osteoarthritis, right ankle and foot: Secondary | ICD-10-CM | POA: Diagnosis not present

## 2022-12-04 DIAGNOSIS — Z79899 Other long term (current) drug therapy: Secondary | ICD-10-CM | POA: Diagnosis not present

## 2022-12-04 DIAGNOSIS — M5136 Other intervertebral disc degeneration, lumbar region: Secondary | ICD-10-CM | POA: Diagnosis not present

## 2022-12-04 DIAGNOSIS — M47819 Spondylosis without myelopathy or radiculopathy, site unspecified: Secondary | ICD-10-CM

## 2022-12-04 DIAGNOSIS — Z8669 Personal history of other diseases of the nervous system and sense organs: Secondary | ICD-10-CM

## 2022-12-04 DIAGNOSIS — M65312 Trigger thumb, left thumb: Secondary | ICD-10-CM

## 2022-12-04 DIAGNOSIS — M19072 Primary osteoarthritis, left ankle and foot: Secondary | ICD-10-CM

## 2022-12-04 DIAGNOSIS — M19041 Primary osteoarthritis, right hand: Secondary | ICD-10-CM

## 2022-12-04 DIAGNOSIS — Z9889 Other specified postprocedural states: Secondary | ICD-10-CM

## 2022-12-04 DIAGNOSIS — M461 Sacroiliitis, not elsewhere classified: Secondary | ICD-10-CM | POA: Diagnosis not present

## 2022-12-04 DIAGNOSIS — Z8269 Family history of other diseases of the musculoskeletal system and connective tissue: Secondary | ICD-10-CM

## 2022-12-04 LAB — CBC WITH DIFFERENTIAL/PLATELET
Absolute Monocytes: 1036 cells/uL — ABNORMAL HIGH (ref 200–950)
Basophils Absolute: 89 cells/uL (ref 0–200)
Basophils Relative: 1.2 %
Eosinophils Absolute: 451 cells/uL (ref 15–500)
Eosinophils Relative: 6.1 %
HCT: 44.1 % (ref 38.5–50.0)
Hemoglobin: 14.4 g/dL (ref 13.2–17.1)
Lymphs Abs: 2028 cells/uL (ref 850–3900)
MCH: 30.1 pg (ref 27.0–33.0)
MCHC: 32.7 g/dL (ref 32.0–36.0)
MCV: 92.1 fL (ref 80.0–100.0)
MPV: 11.1 fL (ref 7.5–12.5)
Monocytes Relative: 14 %
Neutro Abs: 3796 cells/uL (ref 1500–7800)
Neutrophils Relative %: 51.3 %
Platelets: 284 10*3/uL (ref 140–400)
RBC: 4.79 10*6/uL (ref 4.20–5.80)
RDW: 12.8 % (ref 11.0–15.0)
Total Lymphocyte: 27.4 %
WBC: 7.4 10*3/uL (ref 3.8–10.8)

## 2022-12-04 NOTE — Patient Instructions (Addendum)
Standing Labs We placed an order today for your standing lab work.   Please have your standing labs drawn in November and every 3 months  Please have your labs drawn 2 weeks prior to your appointment so that the provider can discuss your lab results at your appointment, if possible.  Please note that you may see your imaging and lab results in MyChart before we have reviewed them. We will contact you once all results are reviewed. Please allow our office up to 72 hours to thoroughly review all of the results before contacting the office for clarification of your results.  WALK-IN LAB HOURS  Monday through Thursday from 8:00 am -12:30 pm and 1:00 pm-5:00 pm and Friday from 8:00 am-12:00 pm.  Patients with office visits requiring labs will be seen before walk-in labs.  You may encounter longer than normal wait times. Please allow additional time. Wait times may be shorter on  Monday and Thursday afternoons.  We do not book appointments for walk-in labs. We appreciate your patience and understanding with our staff.   Labs are drawn by [facility]. Please bring your co-pay at the time of your lab draw.  You may receive a bill from [facility] for your lab work.  Please note if you are on Hydroxychloroquine and and an order has been placed for a Hydroxychloroquine level,  you will need to have it drawn 4 hours or more after your last dose.  If you wish to have your labs drawn at another location, please call the office 24 hours in advance so we can fax the orders.  The office is located at [address], [place], [place] [address]   If you have any questions regarding directions or hours of operation,  please call [phone].   As a reminder, please drink plenty of water prior to coming for your lab work. Thanks!  Vaccines You are taking a medication(s) that can suppress your immune system.  The following immunizations are recommended: Flu annually Covid-19  Td/Tdap (tetanus,  diphtheria, pertussis) every 10 years Pneumonia (Prevnar 15 then Pneumovax 23 at least 1 year apart.  Alternatively, can take Prevnar 20 without needing additional dose) Shingrix: 2 doses from 4 weeks to 6 months apart  Please check with your PCP to make sure you are up to date.  If you have signs or symptoms of an infection or start antibiotics: First, call your PCP for workup of your infection. Hold your medication through the infection, until you complete your antibiotics, and until symptoms resolve if you take the following: Injectable medication (Actemra, Benlysta, Cimzia, Cosentyx, Enbrel, Humira, Kevzara, Orencia, Remicade, Simponi, Stelara, Taltz, Tremfya) Methotrexate Leflunomide (Arava) Mycophenolate (Cellcept) Harriette Ohara, Olumiant, or Rinvoq  Please get an annual skin examination to screen for a skin cancer while you are on Humira.  Please use sunscreen and sun protection.  Heart Disease Prevention   Your inflammatory disease increases your risk of heart disease which includes heart attack, stroke, atrial fibrillation (irregular heartbeats), high blood pressure, heart failure and atherosclerosis (plaque in the arteries).  It is important to reduce your risk by:   Keep blood pressure, cholesterol, and blood sugar at healthy levels   Smoking Cessation   Maintain a healthy weight  BMI 20-25   Eat a healthy diet  Plenty of fresh fruit, vegetables, and whole grains  Limit saturated fats, foods high in sodium, and added sugars  DASH and Mediterranean diet   Increase physical activity  Recommend moderate physically activity for  150 minutes per week/ 30 minutes a day for five days a week These can be broken up into three separate ten-minute sessions during the day.   Reduce Stress  Meditation, slow breathing exercises, yoga, coloring books  Dental visits twice a year

## 2022-12-08 NOTE — Progress Notes (Signed)
CBC normal, TB Gold negative.

## 2022-12-27 DIAGNOSIS — M2550 Pain in unspecified joint: Secondary | ICD-10-CM | POA: Diagnosis not present

## 2022-12-27 DIAGNOSIS — M069 Rheumatoid arthritis, unspecified: Secondary | ICD-10-CM | POA: Diagnosis not present

## 2022-12-27 DIAGNOSIS — Z299 Encounter for prophylactic measures, unspecified: Secondary | ICD-10-CM | POA: Diagnosis not present

## 2022-12-27 DIAGNOSIS — I10 Essential (primary) hypertension: Secondary | ICD-10-CM | POA: Diagnosis not present

## 2022-12-28 DIAGNOSIS — I10 Essential (primary) hypertension: Secondary | ICD-10-CM | POA: Diagnosis not present

## 2023-01-27 ENCOUNTER — Other Ambulatory Visit: Payer: Self-pay | Admitting: *Deleted

## 2023-01-27 DIAGNOSIS — I10 Essential (primary) hypertension: Secondary | ICD-10-CM | POA: Diagnosis not present

## 2023-01-27 DIAGNOSIS — Z79899 Other long term (current) drug therapy: Secondary | ICD-10-CM

## 2023-01-27 MED ORDER — HUMIRA (2 PEN) 40 MG/0.4ML ~~LOC~~ AJKT: 40.0000 mg | AUTO-INJECTOR | SUBCUTANEOUS | 0 refills | Status: DC

## 2023-01-27 NOTE — Telephone Encounter (Signed)
Refill request received via fax from [facility] for Humira  Last Fill: 11/04/2022  Labs: 12/04/2022 CBC normal, 08/22/2022 CMP are normal.  Except potassium is mildly elevated.     TB Gold: 12/04/2022 Neg    Next Visit: 05/06/2023  Last Visit: 12/04/2022  DX: Spondyloarthropathy   Current Dose per office note 12/04/2022: Humira 40 mg injection every 14 days   Patient advised he is due to update CMP and will update that this week. Orders released.   Okay to refill Humira?

## 2023-01-31 ENCOUNTER — Other Ambulatory Visit: Payer: Self-pay | Admitting: Rheumatology

## 2023-01-31 NOTE — Telephone Encounter (Signed)
Last Fill: 11/18/2022  Labs: 12/04/2022 CBC normal, 08/22/2022 CMP are normal.  Except potassium is mildly elevated.   Next Visit: 05/06/2023  Last Visit: 12/04/2022  DX: Spondyloarthropathy   Current Dose per office note 12/04/2022: methotrexate 7.5 mg weekly   Left message to remind patient he is due to update CMP   Okay to refill Methotrexate?

## 2023-02-03 DIAGNOSIS — L400 Psoriasis vulgaris: Secondary | ICD-10-CM | POA: Diagnosis not present

## 2023-02-03 DIAGNOSIS — X32XXXD Exposure to sunlight, subsequent encounter: Secondary | ICD-10-CM | POA: Diagnosis not present

## 2023-02-03 DIAGNOSIS — Z79899 Other long term (current) drug therapy: Secondary | ICD-10-CM | POA: Diagnosis not present

## 2023-02-03 DIAGNOSIS — L570 Actinic keratosis: Secondary | ICD-10-CM | POA: Diagnosis not present

## 2023-02-04 LAB — CBC WITH DIFFERENTIAL/PLATELET
Basophils Absolute: 0.1 10*3/uL (ref 0.0–0.2)
Basos: 1 %
EOS (ABSOLUTE): 0.4 10*3/uL (ref 0.0–0.4)
Eos: 6 %
Hematocrit: 46.7 % (ref 37.5–51.0)
Hemoglobin: 14.8 g/dL (ref 13.0–17.7)
Immature Grans (Abs): 0 10*3/uL (ref 0.0–0.1)
Immature Granulocytes: 0 %
Lymphocytes Absolute: 1.6 10*3/uL (ref 0.7–3.1)
Lymphs: 23 %
MCH: 30 pg (ref 26.6–33.0)
MCHC: 31.7 g/dL (ref 31.5–35.7)
MCV: 95 fL (ref 79–97)
Monocytes Absolute: 0.8 10*3/uL (ref 0.1–0.9)
Monocytes: 11 %
Neutrophils Absolute: 4.1 10*3/uL (ref 1.4–7.0)
Neutrophils: 59 %
Platelets: 296 10*3/uL (ref 150–450)
RBC: 4.93 x10E6/uL (ref 4.14–5.80)
RDW: 12.6 % (ref 11.6–15.4)
WBC: 6.9 10*3/uL (ref 3.4–10.8)

## 2023-02-04 LAB — CMP14+EGFR
ALT: 14 [IU]/L (ref 0–44)
AST: 19 [IU]/L (ref 0–40)
Albumin: 3.9 g/dL (ref 3.8–4.9)
Alkaline Phosphatase: 91 [IU]/L (ref 44–121)
BUN/Creatinine Ratio: 11 (ref 9–20)
BUN: 11 mg/dL (ref 6–24)
Bilirubin Total: 0.5 mg/dL (ref 0.0–1.2)
CO2: 25 mmol/L (ref 20–29)
Calcium: 10 mg/dL (ref 8.7–10.2)
Chloride: 104 mmol/L (ref 96–106)
Creatinine, Ser: 0.99 mg/dL (ref 0.76–1.27)
Globulin, Total: 3.5 g/dL (ref 1.5–4.5)
Glucose: 104 mg/dL — ABNORMAL HIGH (ref 70–99)
Potassium: 5.5 mmol/L — ABNORMAL HIGH (ref 3.5–5.2)
Sodium: 143 mmol/L (ref 134–144)
Total Protein: 7.4 g/dL (ref 6.0–8.5)
eGFR: 88 mL/min/{1.73_m2} (ref >59)

## 2023-02-04 NOTE — Progress Notes (Signed)
CBC is normal.  CMP shows elevated potassium.  Probably a hemolyzed sample.  Patient should not be taking potassium supplement.  Please forward results to his PCP.

## 2023-02-11 DIAGNOSIS — Z299 Encounter for prophylactic measures, unspecified: Secondary | ICD-10-CM | POA: Diagnosis not present

## 2023-02-11 DIAGNOSIS — R5383 Other fatigue: Secondary | ICD-10-CM | POA: Diagnosis not present

## 2023-02-11 DIAGNOSIS — G4730 Sleep apnea, unspecified: Secondary | ICD-10-CM | POA: Diagnosis not present

## 2023-02-11 DIAGNOSIS — I10 Essential (primary) hypertension: Secondary | ICD-10-CM | POA: Diagnosis not present

## 2023-02-19 DIAGNOSIS — G4733 Obstructive sleep apnea (adult) (pediatric): Secondary | ICD-10-CM | POA: Diagnosis not present

## 2023-02-25 ENCOUNTER — Other Ambulatory Visit: Payer: Self-pay | Admitting: Physician Assistant

## 2023-02-26 DIAGNOSIS — I10 Essential (primary) hypertension: Secondary | ICD-10-CM | POA: Diagnosis not present

## 2023-03-05 DIAGNOSIS — I10 Essential (primary) hypertension: Secondary | ICD-10-CM | POA: Diagnosis not present

## 2023-03-05 DIAGNOSIS — Z299 Encounter for prophylactic measures, unspecified: Secondary | ICD-10-CM | POA: Diagnosis not present

## 2023-03-05 DIAGNOSIS — M069 Rheumatoid arthritis, unspecified: Secondary | ICD-10-CM | POA: Diagnosis not present

## 2023-03-05 DIAGNOSIS — M25512 Pain in left shoulder: Secondary | ICD-10-CM | POA: Diagnosis not present

## 2023-03-09 ENCOUNTER — Other Ambulatory Visit: Payer: Self-pay | Admitting: Physician Assistant

## 2023-03-10 NOTE — Telephone Encounter (Signed)
Last Fill: 01/31/2023 (30 day supply)  Labs: 02/03/2023 CBC is normal.  CMP shows elevated potassium.     Next Visit: 05/06/2023  Last Visit: 12/04/2022  DX: Spondyloarthropathy   Current Dose per office note 12/04/2022: methotrexate 7.5 mg weekly   Okay to refill Methotrexate?

## 2023-03-28 DIAGNOSIS — I10 Essential (primary) hypertension: Secondary | ICD-10-CM | POA: Diagnosis not present

## 2023-03-31 ENCOUNTER — Telehealth: Payer: Self-pay | Admitting: *Deleted

## 2023-03-31 NOTE — Telephone Encounter (Signed)
Patient contacted the office asking who should complete the patient assistance forms. Patient advised he should complete his portion and we would complete our portion. Patient expressed understanding.

## 2023-04-09 ENCOUNTER — Other Ambulatory Visit: Payer: Self-pay | Admitting: Physician Assistant

## 2023-04-09 NOTE — Telephone Encounter (Signed)
Last Fill: 04/08/2022  Next Visit: 05/06/2023  Last Visit: 12/04/2022  Dx: Spondyloarthropathy   Current Dose per office note on 12/04/2022: folic acid 1 mg QD   Okay to refill Folic Acid?

## 2023-04-14 ENCOUNTER — Other Ambulatory Visit: Payer: Self-pay | Admitting: *Deleted

## 2023-04-14 MED ORDER — HUMIRA (2 PEN) 40 MG/0.4ML ~~LOC~~ AJKT: 40.0000 mg | AUTO-INJECTOR | SUBCUTANEOUS | 0 refills | Status: DC

## 2023-04-14 NOTE — Telephone Encounter (Signed)
Last Fill: 01/27/2023  Labs: 02/03/2023  CBC is normal.  CMP shows elevated potassium.  Probably a hemolyzed sample.  Patient should not be taking potassium supplement.  Please forward results to his PCP.   TB Gold: 12/04/2022 TB Gold negative.   Next Visit: 05/06/2023  Last Visit: 12/04/2022  WU:[id number]   Current Dose per office note 12/04/2022: Humira 40 mg injection every 14 days,   Okay to refill Humira?

## 2023-04-24 NOTE — Progress Notes (Signed)
 Office Visit Note  Patient: Charles Jenkins             Date of Birth: April 18, 1961           MRN: [id number]             PCP: Rosamond Leta NOVAK, MD Referring: Rosamond Leta NOVAK, MD Visit Date: 05/06/2023 Occupation: @GUAROCC @  Subjective:  Medication monitoring   History of Present Illness: Charles Jenkins is a 62 y.o. male with history of spondyloarthropathy.  Patient remains on Humira  40 mg injection every 14 days, methotrexate   3 tablets 7.5 mg weekly and folic acid  1 mg QD for spondyloarthropathy.  Patient remains on combination therapy as prescribed.  He is tolerating combination therapy without any side effects and has not had any recent interruptions in therapy.  Patient reports that he is due for his next dose of Humira  tomorrow and that will require refill.  He has not yet heard back from [employer] whether he has been approved for 2025. He continues to find combination therapy to be effective at managing his symptoms.  He denies any signs or symptoms of a flare.  He continues to have chronic pain in his lower back specifically in the lumbar spine.  He denies any SI joint pain at this time.  He denies any Achilles tendinitis or plantar fasciitis.  He experiences aching in both knees but did not decide to proceed with viscosupplementation due to cost.  He denies any joint swelling at this time.  His lower back pain and knee joint pain is exacerbated by weather changes and activity changes.  He takes tramadol as needed for pain relief. He denies any recent or recurrent infections.  He has had the first dose of Shingrix vaccine and is up-to-date with the RSV vaccine and COVID-vaccine.  Activities of Daily Living:  Patient reports morning stiffness for 10 minutes.   Patient Denies nocturnal pain.  Difficulty dressing/grooming: Denies Difficulty climbing stairs: Reports Difficulty getting out of chair: Reports Difficulty using hands for taps, buttons, cutlery, and/or writing: Reports  Review of  Systems  Constitutional:  Negative for fatigue.  HENT:  Negative for mouth sores and mouth dryness.   Eyes:  Negative for dryness.  Respiratory:  Negative for shortness of breath.   Cardiovascular:  Negative for chest pain and palpitations.  Gastrointestinal:  Negative for blood in stool, constipation and diarrhea.  Endocrine: Negative for increased urination.  Genitourinary:  Negative for involuntary urination.  Musculoskeletal:  Positive for joint pain, joint pain, joint swelling, myalgias, muscle weakness, morning stiffness, muscle tenderness and myalgias. Negative for gait problem.  Skin:  Negative for color change, rash, hair loss and sensitivity to sunlight.  Allergic/Immunologic: Negative for susceptible to infections.  Neurological:  Negative for dizziness and headaches.  Hematological:  Negative for swollen glands.  Psychiatric/Behavioral:  Positive for sleep disturbance. Negative for depressed mood. The patient is not nervous/anxious.     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   Insomnia 03/10/2013  Spinal stenosis of lumbar region 10/28/2012   Sciatica 09/23/2012   Essential hypertension, benign 07/24/2012   Sacroiliitis (HCC) 07/24/2012   Depression 07/24/2012    Past Medical History:  Diagnosis Date   Ankylosing spondylitis (HCC)    Arthritis    Back pain    Chronic right SI joint pain    Hypertension    only in doctor's offices   Sleep apnea    per patient      Family History  Problem Relation Age of Onset   Scleroderma Mother    Past Surgical History:  Procedure Laterality Date   KNEE ARTHROPLASTY Left 2018   LUMBAR LAMINECTOMY/DECOMPRESSION MICRODISCECTOMY Left 10/28/2012   Procedure: MICRO LUMBAR DECOMPRESSION L4 - L5 and L5 - S1 2 LEVELS;  Surgeon: Reyes JAYSON Billing, MD;  Location: [facility];  Service: Orthopedics;  Laterality: Left;   LUMBAR LAMINECTOMY/DECOMPRESSION MICRODISCECTOMY N/A 05/04/2014   Procedure: MICRO LUMBAR DECOMPRESSION L2-L3, L3-L4;  Surgeon: Reyes JAYSON Billing, MD;  Location: [facility];  Service: Orthopedics;  Laterality: N/A;   Social History   Social History Narrative   Not on file   Immunization History  Administered Date(s) Administered   Influenza,inj,quad, With Preservative 01/30/2017, 02/10/2018   Influenza-Unspecified 02/27/2012, 01/27/2013, 02/08/2014   Moderna Sars-Covid-2 Vaccination 07/14/2019, 08/09/2019, 12/16/2019   Pneumococcal-Unspecified 02/27/2012     Objective: Vital Signs: BP (!) 152/80 (BP Location: Left Arm, Patient Position: Sitting, Cuff Size: Large)   Pulse 65   Resp 14   Ht 6' (1.829 m)   Wt (!) 354 lb (160.6 kg)   BMI 48.01 kg/m    Physical Exam Vitals and nursing note reviewed.  Constitutional:      Appearance: He is well-developed.  HENT:     Head: Normocephalic and atraumatic.  Eyes:     Conjunctiva/sclera: Conjunctivae normal.     Pupils: Pupils are equal, round, and reactive to light.  Cardiovascular:     Rate and Rhythm: Normal rate and regular rhythm.     Heart sounds: Normal heart sounds.  Pulmonary:     Effort: Pulmonary effort is normal.     Breath sounds: Normal breath sounds.  Abdominal:     General: Bowel sounds are normal.     Palpations: Abdomen is soft.  Musculoskeletal:     Cervical back: Normal range of motion and neck supple.  Skin:    General: Skin is warm and dry.     Capillary Refill: Capillary refill takes less than 2 seconds.  Neurological:      Mental Status: He is alert and oriented to person, place, and time.  Psychiatric:        Behavior: Behavior normal.      Musculoskeletal Exam: C-spine has good range of motion.  No midline spinal tenderness.  No SI joint tenderness.  Shoulder joints have good range of motion with some discomfort in his right shoulder.  Mild flexion contractures noted in both elbows-unchanged.  No inflammation noted.  Wrist joints, MCPs, PIPs, DIPs have good range of motion with no synovitis.  PIP and DIP thickening noted.  Knee joints have good range of motion with some discomfort but no warmth or effusion noted.  Ankle joints have good range of motion with no tenderness or joint swelling.  No evidence of Achilles tendinitis.  CDAI Exam: CDAI Score: -- Patient Global: --; Provider Global: -- Swollen: --; Tender: -- Joint Exam 05/06/2023   No joint exam has been documented for this visit   There is currently no  information documented on the homunculus. Go to the Rheumatology activity and complete the homunculus joint exam.  Investigation: No additional findings.  Imaging: No results found.  Recent Labs: Lab Results  Component Value Date   WBC 6.9 02/03/2023   HGB 14.8 02/03/2023   PLT 296 02/03/2023   NA 143 02/03/2023   K 5.5 (H) 02/03/2023   CL 104 02/03/2023   CO2 25 02/03/2023   GLUCOSE 104 (H) 02/03/2023   BUN 11 02/03/2023   CREATININE 0.99 02/03/2023   BILITOT 0.5 02/03/2023   ALKPHOS 91 02/03/2023   AST 19 02/03/2023   ALT 14 02/03/2023   PROT 7.4 02/03/2023   ALBUMIN 3.9 02/03/2023   CALCIUM 10.0 02/03/2023   GFRAA 113 06/07/2020   QFTBGOLDPLUS NEGATIVE 12/04/2022    Speciality Comments: Approved for Humira  PAP through [facility] [facility] through 04/29/2019.  Procedures:  No procedures performed Allergies: Patient has no known allergies.    Assessment / Plan:     Visit Diagnoses: Spondyloarthropathy - History of inflammatory arthritis and sacroiliitis: He has not had any  signs or symptoms of a flare.  He has clinically been doing well on Humira  40 mg 5 days injections every 14 days and methotrexate  3 tablets by mouth once weekly.  He is tolerating combination therapy without any side effects and has not had any recent interruptions in therapy.  He has no SI joint tenderness upon palpation today.  No evidence of Achilles tendinitis or plantar fasciitis.  No signs or symptoms of uveitis.  No synovitis or dactylitis was noted on examination today.  No medication changes will be made at this time.  Patient was advised to contact [employer] for an update on the approval for 2025 for Humira .  He plans on taking his last dose of Humira  tomorrow.  He will notify us  if there will be a delay for his next Humira  shipment.  He will follow-up in the office in 5 months or sooner if needed.  High risk medication use - Humira  40 mg injection every 14 days, methotrexate  7.5 mg weekly and folic acid  1 mg QD for spondyloarthropathy. CBC and CMP updated on 02/03/23.  Orders for CBC and CMP released today.  His next lab work will be due in April and every 3 months to monitor for toxicity. TB gold negative on 12/04/22. No recent or recurrent infections.  Discussed the importance of holding humira  and methotrexate  if he develops signs or symptoms of an infection and to resume once the infection has completely cleared. Patient has had the first Shingrix vaccine, RSV vaccine, and is up-to-date with the COVID-vaccine.  - Plan: CBC with Differential/Platelet, COMPLETE METABOLIC PANEL WITH GFR  Primary osteoarthritis of both hands: He has PIP and DIP thickening consistent with osteoarthritis of both hands.  No synovitis or dactylitis noted.  Complete fist formation bilaterally.  Primary osteoarthritis of both feet: He is not experiencing any increased discomfort in his feet at this time.  No evidence of Achilles tendinitis or plantar fasciitis.  He is wearing proper fitting shoes.  Sacroiliitis [facility]): No  SI joint tenderness upon palpation.  No nocturnal pain.  Trigger thumb of both hands: Not currently symptomatic.  S/P arthroscopic surgery of left knee: Chronic pain.  Patient decided against viscosupplementation due to cost. No warmth or effusion noted today.  Degeneration of intervertebral disc of lumbar region without discogenic back pain or lower extremity pain: Chronic pain.  Patient underwent lumbar laminectomy and decompression on 10/28/12 followed by 05/04/14.  He  continues to have chronic pain in his lower back which is exacerbated by his activity and weather changes.  Other medical conditions are listed as follows:  Primary insomnia  History of sleep apnea  History of colon polyps  History of hypertension: Blood pressure was 152/80 today in the office.  Patient was advised to monitor blood pressure closely to reach out to PCP if remains elevated.  Family history of scleroderma  Orders: Orders Placed This Encounter  Procedures   CBC with Differential/Platelet   COMPLETE METABOLIC PANEL WITH GFR   No orders of the defined types were placed in this encounter.    Follow-Up Instructions: Return in about 5 months (around 10/04/2023) for Spondyloarthropathy.   Waddell CHRISTELLA Craze, PA-C  Note - This record has been created using Dragon software.  Chart creation errors have been sought, but may not always  have been located. Such creation errors do not reflect on  the standard of medical care.

## 2023-04-28 DIAGNOSIS — I10 Essential (primary) hypertension: Secondary | ICD-10-CM | POA: Diagnosis not present

## 2023-05-06 ENCOUNTER — Ambulatory Visit: Payer: Medicare Other | Attending: Physician Assistant | Admitting: Physician Assistant

## 2023-05-06 ENCOUNTER — Encounter: Payer: Self-pay | Admitting: Physician Assistant

## 2023-05-06 VITALS — BP 152/80 | HR 65 | Resp 14 | Ht 72.0 in | Wt 354.0 lb

## 2023-05-06 DIAGNOSIS — M19072 Primary osteoarthritis, left ankle and foot: Secondary | ICD-10-CM

## 2023-05-06 DIAGNOSIS — Z8269 Family history of other diseases of the musculoskeletal system and connective tissue: Secondary | ICD-10-CM

## 2023-05-06 DIAGNOSIS — Z9889 Other specified postprocedural states: Secondary | ICD-10-CM

## 2023-05-06 DIAGNOSIS — M19071 Primary osteoarthritis, right ankle and foot: Secondary | ICD-10-CM

## 2023-05-06 DIAGNOSIS — Z8679 Personal history of other diseases of the circulatory system: Secondary | ICD-10-CM | POA: Diagnosis not present

## 2023-05-06 DIAGNOSIS — Z79899 Other long term (current) drug therapy: Secondary | ICD-10-CM

## 2023-05-06 DIAGNOSIS — F5101 Primary insomnia: Secondary | ICD-10-CM

## 2023-05-06 DIAGNOSIS — Z8669 Personal history of other diseases of the nervous system and sense organs: Secondary | ICD-10-CM | POA: Diagnosis not present

## 2023-05-06 DIAGNOSIS — M19041 Primary osteoarthritis, right hand: Secondary | ICD-10-CM

## 2023-05-06 DIAGNOSIS — M47819 Spondylosis without myelopathy or radiculopathy, site unspecified: Secondary | ICD-10-CM | POA: Diagnosis not present

## 2023-05-06 DIAGNOSIS — M461 Sacroiliitis, not elsewhere classified: Secondary | ICD-10-CM | POA: Diagnosis not present

## 2023-05-06 DIAGNOSIS — M51369 Other intervertebral disc degeneration, lumbar region without mention of lumbar back pain or lower extremity pain: Secondary | ICD-10-CM | POA: Diagnosis not present

## 2023-05-06 DIAGNOSIS — M65312 Trigger thumb, left thumb: Secondary | ICD-10-CM

## 2023-05-06 DIAGNOSIS — Z860100 Personal history of colon polyps, unspecified: Secondary | ICD-10-CM

## 2023-05-06 DIAGNOSIS — M65311 Trigger thumb, right thumb: Secondary | ICD-10-CM | POA: Diagnosis not present

## 2023-05-06 DIAGNOSIS — M19042 Primary osteoarthritis, left hand: Secondary | ICD-10-CM

## 2023-05-06 NOTE — Patient Instructions (Signed)
 Standing Labs We placed an order today for your standing lab work.   Please have your standing labs drawn in April and every 3 months   Please have your labs drawn 2 weeks prior to your appointment so that the provider can discuss your lab results at your appointment, if possible.  Please note that you may see your imaging and lab results in MyChart before we have reviewed them. We will contact you once all results are reviewed. Please allow our office up to 72 hours to thoroughly review all of the results before contacting the office for clarification of your results.  WALK-IN LAB HOURS  Monday through Thursday from 8:00 am -12:30 pm and 1:00 pm-5:00 pm and Friday from 8:00 am-12:00 pm.  Patients with office visits requiring labs will be seen before walk-in labs.  You may encounter longer than normal wait times. Please allow additional time. Wait times may be shorter on  Monday and Thursday afternoons.  We do not book appointments for walk-in labs. We appreciate your patience and understanding with our staff.   Labs are drawn by [facility]. Please bring your co-pay at the time of your lab draw.  You may receive a bill from [facility] for your lab work.  Please note if you are on Hydroxychloroquine and and an order has been placed for a Hydroxychloroquine level,  you will need to have it drawn 4 hours or more after your last dose.  If you wish to have your labs drawn at another location, please call the office 24 hours in advance so we can fax the orders.  The office is located at [address], [place], [place] [address]   If you have any questions regarding directions or hours of operation,  please call [phone].   As a reminder, please drink plenty of water prior to coming for your lab work. Thanks!

## 2023-05-07 LAB — CBC WITH DIFFERENTIAL/PLATELET
Absolute Lymphocytes: 1763 {cells}/uL (ref 850–3900)
Absolute Monocytes: 886 {cells}/uL (ref 200–950)
Basophils Absolute: 77 {cells}/uL (ref 0–200)
Basophils Relative: 1 %
Eosinophils Absolute: 316 {cells}/uL (ref 15–500)
Eosinophils Relative: 4.1 %
HCT: 44.4 % (ref 38.5–50.0)
Hemoglobin: 14.6 g/dL (ref 13.2–17.1)
MCH: 30.2 pg (ref 27.0–33.0)
MCHC: 32.9 g/dL (ref 32.0–36.0)
MCV: 91.9 fL (ref 80.0–100.0)
MPV: 11.8 fL (ref 7.5–12.5)
Monocytes Relative: 11.5 %
Neutro Abs: 4659 {cells}/uL (ref 1500–7800)
Neutrophils Relative %: 60.5 %
Platelets: 261 10*3/uL (ref 140–400)
RBC: 4.83 10*6/uL (ref 4.20–5.80)
RDW: 12.6 % (ref 11.0–15.0)
Total Lymphocyte: 22.9 %
WBC: 7.7 10*3/uL (ref 3.8–10.8)

## 2023-05-07 LAB — COMPLETE METABOLIC PANEL WITH GFR
AG Ratio: 1 (calc) (ref 1.0–2.5)
ALT: 16 U/L (ref 9–46)
AST: 17 U/L (ref 10–35)
Albumin: 3.8 g/dL (ref 3.6–5.1)
Alkaline phosphatase (APISO): 78 U/L (ref 35–144)
BUN: 11 mg/dL (ref 7–25)
CO2: 29 mmol/L (ref 20–32)
Calcium: 9.6 mg/dL (ref 8.6–10.3)
Chloride: 102 mmol/L (ref 98–110)
Creat: 0.98 mg/dL (ref 0.70–1.35)
Globulin: 3.7 g/dL (ref 1.9–3.7)
Glucose, Bld: 92 mg/dL (ref 65–99)
Potassium: 4.4 mmol/L (ref 3.5–5.3)
Sodium: 139 mmol/L (ref 135–146)
Total Bilirubin: 0.5 mg/dL (ref 0.2–1.2)
Total Protein: 7.5 g/dL (ref 6.1–8.1)
eGFR: 88 mL/min/{1.73_m2} (ref >=60)

## 2023-05-07 NOTE — Progress Notes (Signed)
 CBC and CMP WNL

## 2023-05-16 ENCOUNTER — Telehealth: Payer: Self-pay | Admitting: Rheumatology

## 2023-05-16 NOTE — Telephone Encounter (Signed)
Prescription was sent in on 04/14/2023. Patient advised. Patient states he has completed his patient assistance form but has not heard if he is approved for this year. Please follow up with patient. Thanks!

## 2023-05-16 NOTE — Telephone Encounter (Signed)
Patient contacted the office to request a medication refill.   1. Name of Medication: Humira  2. How are you currently taking this medication (dosage and times per day)? 1 injection / every 14 days   3. What pharmacy would you like for that to be sent to? [facility]  Patient states he only has one injection remaining.

## 2023-05-22 ENCOUNTER — Telehealth: Payer: Self-pay | Admitting: Pharmacist

## 2023-05-22 NOTE — Telephone Encounter (Signed)
Will work with patient to complete Humira PAP renewal application

## 2023-05-22 NOTE — Telephone Encounter (Signed)
Spoke with patient about Humira PAP renewal application. He will stop by one morning soon and sign form. Patient form placed up front in file cabinet  Prescriber form placed in Dr. Fatima Sanger folder for signature  Submitted a Prior Authorization request to [facility] for HUMIRA via CoverMyMeds. Will update once we receive a response.  Key: [id number]  Per automated response: This medication or product was previously approved on [id number] from 2023-04-30 to 2024-04-28. Providers contact us at [phone] for further assistance.   Chesley Mires, PharmD, MPH, BCPS, CPP Clinical Pharmacist (Rheumatology and Pulmonology)

## 2023-05-22 NOTE — Telephone Encounter (Signed)
Auth letter received from [employer]. Scanned to media tab for submission with PAP application once needed

## 2023-05-26 NOTE — Telephone Encounter (Signed)
Received signed patient form for Humira PAP renewal. Submission pending MD form  Scanned pt form, med list, insurance card copy, and PA approval to Dorris Singh, PharmD, MPH, BCPS, CPP Clinical Pharmacist (Rheumatology and Pulmonology)

## 2023-05-26 NOTE — Telephone Encounter (Signed)
Received signed provider form. Submitted Patient Assistance RENEWAL Application to [facility] for HUMIRA along with provider portion, patient portion, PA, medication list, insurance card copy. Will update patient when we receive a response.  Phone: [phone] Fax: [phone]

## 2023-05-28 DIAGNOSIS — I10 Essential (primary) hypertension: Secondary | ICD-10-CM | POA: Diagnosis not present

## 2023-06-03 ENCOUNTER — Telehealth: Payer: Self-pay | Admitting: *Deleted

## 2023-06-03 NOTE — Telephone Encounter (Signed)
 Patient contacted the office and left message requesting a call back. Patient states he is waiting to here back from [employer] about patient assistance. Patient wants to know if he should continue to take his MTX.  Returned call to patient to advise that he should continue to take his MTX.

## 2023-06-04 DIAGNOSIS — G4730 Sleep apnea, unspecified: Secondary | ICD-10-CM | POA: Diagnosis not present

## 2023-06-04 DIAGNOSIS — I10 Essential (primary) hypertension: Secondary | ICD-10-CM | POA: Diagnosis not present

## 2023-06-04 DIAGNOSIS — Z299 Encounter for prophylactic measures, unspecified: Secondary | ICD-10-CM | POA: Diagnosis not present

## 2023-06-04 DIAGNOSIS — D849 Immunodeficiency, unspecified: Secondary | ICD-10-CM | POA: Diagnosis not present

## 2023-06-04 DIAGNOSIS — M069 Rheumatoid arthritis, unspecified: Secondary | ICD-10-CM | POA: Diagnosis not present

## 2023-06-09 ENCOUNTER — Other Ambulatory Visit: Payer: Self-pay | Admitting: *Deleted

## 2023-06-09 MED ORDER — METHOTREXATE SODIUM 2.5 MG PO TABS: ORAL_TABLET | ORAL | 0 refills | Status: DC

## 2023-06-09 NOTE — Telephone Encounter (Signed)
 Patient contacted the office requesting a refill on MTX  Last Fill: 03/10/2023  Labs: 05/06/2023 CBC and CMP WNL   Next Visit: 10/28/2023  Last Visit: 05/06/2023  DX: Spondyloarthropathy   Current Dose per office note 05/06/2023: methotrexate  7.5 mg weekly   Okay to refill Methotrexate ?

## 2023-06-10 NOTE — Telephone Encounter (Signed)
Spoke with [employer] representative to check status of PAP application. It is still pending review.   Sofie Rower, PharmD [facility] PGY-1

## 2023-06-17 ENCOUNTER — Other Ambulatory Visit (HOSPITAL_COMMUNITY): Payer: Self-pay

## 2023-06-17 NOTE — Telephone Encounter (Signed)
 Received fax from [employer] regarding Humira PAP renewal application. Patient must apply for Medicare Extra Help (SS LIS). If denied, will need denial letter to submit to [employer]. If approved, medications will become affordable  I called patient and discussed above. Provided him phone number for SS LIS (Medicare Extra Help). He will follow-up  Chesley Mires, PharmD, MPH, BCPS, CPP Clinical Pharmacist (Rheumatology and Pulmonology)

## 2023-06-27 ENCOUNTER — Telehealth: Payer: Self-pay | Admitting: *Deleted

## 2023-06-27 DIAGNOSIS — Z79899 Other long term (current) drug therapy: Secondary | ICD-10-CM

## 2023-06-27 DIAGNOSIS — I10 Essential (primary) hypertension: Secondary | ICD-10-CM | POA: Diagnosis not present

## 2023-06-27 NOTE — Telephone Encounter (Signed)
 Discussed options with Dr. Corliss Skains.  Recommend increasing the dose of methotrexate to 6 tablets by mouth once weekly.  Recheck CBC and CMP in 2 weeks.

## 2023-06-27 NOTE — Addendum Note (Signed)
 Addended by: Henriette Combs on: 06/27/2023 12:35 PM   Modules accepted: Orders

## 2023-06-27 NOTE — Telephone Encounter (Signed)
 Patient contacted the office. Patient states he contacted the social security office like he was advised. Patient states they will be sending him a paper in the mail to complete for the extra help. Patient states he should receive that next week. Patient states that he will bring denail letter if he receives one. Patient states he had his last Humira on 04/22/2023 due to waiting on [employer] and approval for assistance. Patient states he has continued the Methotrexate as prescribed. Patient states he is having trouble with all of his joints. He states most notably the pain in his hands, fingers, elbows, feet and toes are worse. Patient states he has tried Ibuprofen and that has not helped. Patient would like to know what else he may be able to do.

## 2023-06-27 NOTE — Telephone Encounter (Signed)
 Patient advised Charles Jenkins discussed options with Dr. Corliss Skains.  Recommend increasing the dose of methotrexate to 6 tablets by mouth once weekly.  Recheck CBC and CMP in 2 weeks.

## 2023-07-23 ENCOUNTER — Other Ambulatory Visit: Payer: Self-pay | Admitting: *Deleted

## 2023-07-23 DIAGNOSIS — Z79899 Other long term (current) drug therapy: Secondary | ICD-10-CM

## 2023-07-27 DIAGNOSIS — I10 Essential (primary) hypertension: Secondary | ICD-10-CM | POA: Diagnosis not present

## 2023-07-29 DIAGNOSIS — Z79899 Other long term (current) drug therapy: Secondary | ICD-10-CM | POA: Diagnosis not present

## 2023-07-30 LAB — CBC WITH DIFFERENTIAL/PLATELET
Basophils Absolute: 0.1 10*3/uL (ref 0.0–0.2)
Basos: 1 %
EOS (ABSOLUTE): 0.4 10*3/uL (ref 0.0–0.4)
Eos: 6 %
Hematocrit: 45.1 % (ref 37.5–51.0)
Hemoglobin: 14.8 g/dL (ref 13.0–17.7)
Immature Grans (Abs): 0 10*3/uL (ref 0.0–0.1)
Immature Granulocytes: 0 %
Lymphocytes Absolute: 1.5 10*3/uL (ref 0.7–3.1)
Lymphs: 23 %
MCH: 30.2 pg (ref 26.6–33.0)
MCHC: 32.8 g/dL (ref 31.5–35.7)
MCV: 92 fL (ref 79–97)
Monocytes Absolute: 0.8 10*3/uL (ref 0.1–0.9)
Monocytes: 12 %
Neutrophils Absolute: 3.9 10*3/uL (ref 1.4–7.0)
Neutrophils: 58 %
Platelets: 274 10*3/uL (ref 150–450)
RBC: 4.9 x10E6/uL (ref 4.14–5.80)
RDW: 12.5 % (ref 11.6–15.4)
WBC: 6.7 10*3/uL (ref 3.4–10.8)

## 2023-07-30 LAB — CMP14+EGFR
ALT: 21 IU/L (ref 0–44)
AST: 23 IU/L (ref 0–40)
Albumin: 4 g/dL (ref 3.8–4.9)
Alkaline Phosphatase: 83 IU/L (ref 44–121)
BUN/Creatinine Ratio: 16 (ref 10–24)
BUN: 15 mg/dL (ref 8–27)
Bilirubin Total: 0.4 mg/dL (ref 0.0–1.2)
CO2: 23 mmol/L (ref 20–29)
Calcium: 9.5 mg/dL (ref 8.6–10.2)
Chloride: 101 mmol/L (ref 96–106)
Creatinine, Ser: 0.96 mg/dL (ref 0.76–1.27)
Globulin, Total: 3.2 g/dL (ref 1.5–4.5)
Glucose: 91 mg/dL (ref 70–99)
Potassium: 5.1 mmol/L (ref 3.5–5.2)
Sodium: 140 mmol/L (ref 134–144)
Total Protein: 7.2 g/dL (ref 6.0–8.5)
eGFR: 90 mL/min/{1.73_m2} (ref >59)

## 2023-07-30 NOTE — Progress Notes (Signed)
 CBC with differential and CMP with GFR

## 2023-08-15 ENCOUNTER — Other Ambulatory Visit: Payer: Self-pay | Admitting: Physician Assistant

## 2023-08-15 NOTE — Telephone Encounter (Signed)
 Last Fill: 06/09/2023  Labs: 07/29/2023 CBC with differential and CMP with GFR   Next Visit: 10/28/2023  Last Visit: 05/06/2023  DX: Spondyloarthropathy   Current Dose per office note on 05/06/2023: methotrexate  7.5 mg weekly   Okay to refill Methotrexate ?

## 2023-08-27 DIAGNOSIS — Z299 Encounter for prophylactic measures, unspecified: Secondary | ICD-10-CM | POA: Diagnosis not present

## 2023-08-27 DIAGNOSIS — I10 Essential (primary) hypertension: Secondary | ICD-10-CM | POA: Diagnosis not present

## 2023-08-27 DIAGNOSIS — Z0000 Encounter for general adult medical examination without abnormal findings: Secondary | ICD-10-CM | POA: Diagnosis not present

## 2023-08-27 DIAGNOSIS — Z713 Dietary counseling and surveillance: Secondary | ICD-10-CM | POA: Diagnosis not present

## 2023-08-27 DIAGNOSIS — M069 Rheumatoid arthritis, unspecified: Secondary | ICD-10-CM | POA: Diagnosis not present

## 2023-08-27 DIAGNOSIS — Z7189 Other specified counseling: Secondary | ICD-10-CM | POA: Diagnosis not present

## 2023-08-27 DIAGNOSIS — Z1389 Encounter for screening for other disorder: Secondary | ICD-10-CM | POA: Diagnosis not present

## 2023-09-02 ENCOUNTER — Telehealth: Payer: Self-pay

## 2023-09-02 ENCOUNTER — Telehealth: Payer: Self-pay | Admitting: Rheumatology

## 2023-09-02 NOTE — Telephone Encounter (Signed)
 Patient called stating he was returning the pharmacist's call regarding his medication.

## 2023-09-02 NOTE — Telephone Encounter (Signed)
 Patient contacted the office stating he was returning a call to the pharmacy team.

## 2023-09-02 NOTE — Telephone Encounter (Signed)
 Called [employer] to discuss update on Humira  patient assistance application renewal. Per representative there have been no updates since they told the patient to apply for Medicare Extra Help (SS LIS).   Attempted to call patient to discuss if he has applied for Extra Help. Unable to reach him. LVM.   Phone: [phone] Fax: [phone]  Tolu Kionna Brier, PharmD [facility] PGY-1

## 2023-09-03 NOTE — Telephone Encounter (Signed)
 Patient returned call. He states he received a letter this Monday that he is going to be denied LIS. Letter states that he will receive official denial letter in 10 days.He will drop off in clinic as soon as he receives it so that we cna submit to Christianna Cowman, PharmD, MPH, BCPS, CPP Clinical Pharmacist (Rheumatology and Pulmonology)

## 2023-09-03 NOTE — Telephone Encounter (Signed)
 Returned call - addressed.

## 2023-09-27 DIAGNOSIS — I10 Essential (primary) hypertension: Secondary | ICD-10-CM | POA: Diagnosis not present

## 2023-09-29 NOTE — Telephone Encounter (Signed)
 Received [employer] Extra Help/LIS denial letter (dated 09/20/2023).  Faxed to [place]  Phone: [phone] Fax: [phone]  Geraldene Kleine, PharmD, MPH, BCPS, CPP Clinical Pharmacist (Rheumatology and Pulmonology)

## 2023-10-01 NOTE — Telephone Encounter (Signed)
 Received a fax from  [employer] regarding an approval for HUMIRA  patient assistance from 09/30/2023 to 04/28/2024. Approval letter sent to scan center.  Phone: [phone] Fax: [phone]  ATC patient to discuss. Unable to reach. Left detailed VM with callback number for [employer] to schedule shipment  Geraldene Kleine, PharmD, MPH, BCPS, CPP Clinical Pharmacist (Rheumatology and Pulmonology)

## 2023-10-14 NOTE — Progress Notes (Signed)
 Office Visit Note  Patient: Charles Jenkins             Date of Birth: [birth year]           MRN: [id number]             PCP: Rosamond Leta NOVAK, MD Referring: Rosamond Leta NOVAK, MD Visit Date: 10/28/2023 Occupation: @GUAROCC @  Subjective:  Joint pain and stiffness  History of Present Illness: Charles Jenkins is a 63 y.o. male with a spondyloarthropathy and osteoarthritis.  He returns today after his last visit in January 2025.  He states he ran out of Humira  in December 2024 due to insurance issues.  He was off Humira  until last week.  He finally got Humira  approved through the Medicare part D.  He continued methotrexate  3 tablets p.o. weekly along with folic acid .  He noticed increased joint pain and stiffness while he was off Humira .  He denies any episodes of Achilles tendinitis or plantar fasciitis.  He started having increased discomfort in his spine and SI joints.  He continues to have intermittent discomfort in his hands and feet depending on the activities.    Activities of Daily Living:  Patient reports morning stiffness for 10 minutes.   Patient Denies nocturnal pain.  Difficulty dressing/grooming: Reports Difficulty climbing stairs: Reports Difficulty getting out of chair: Reports Difficulty using hands for taps, buttons, cutlery, and/or writing: Denies  Review of Systems  Constitutional:  Negative for fatigue.  HENT:  Negative for mouth sores and mouth dryness.   Eyes:  Negative for dryness.  Respiratory:  Negative for shortness of breath.   Cardiovascular:  Negative for chest pain and palpitations.  Gastrointestinal:  Negative for blood in stool, constipation and diarrhea.  Endocrine: Negative for increased urination.  Genitourinary:  Negative for involuntary urination.  Musculoskeletal:  Positive for joint pain, joint pain and morning stiffness. Negative for gait problem, joint swelling, myalgias, muscle weakness, muscle tenderness and myalgias.  Skin:  Negative for color  change, rash, hair loss and sensitivity to sunlight.  Allergic/Immunologic: Negative for susceptible to infections.  Neurological:  Negative for dizziness and headaches.  Hematological:  Negative for swollen glands.  Psychiatric/Behavioral:  Negative for depressed mood and sleep disturbance. The patient is not nervous/anxious.     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   Insomnia 03/10/2013   Spinal stenosis of lumbar region 10/28/2012   Sciatica 09/23/2012   Essential hypertension, benign 07/24/2012   Sacroiliitis (HCC) 07/24/2012   Depression 07/24/2012    Past Medical History:  Diagnosis Date   Ankylosing spondylitis (HCC)    Arthritis    Back pain    Chronic right SI joint pain    Hypertension    only in doctor's offices   Sleep apnea    per patient     Family History  Problem Relation Age of Onset   Scleroderma Mother    Past Surgical History:  Procedure Laterality Date   KNEE ARTHROPLASTY Left 2018   LUMBAR LAMINECTOMY/DECOMPRESSION MICRODISCECTOMY  Left 10/28/2012   Procedure: MICRO LUMBAR DECOMPRESSION L4 - L5 and L5 - S1 2 LEVELS;  Surgeon: Reyes JAYSON Billing, MD;  Location: [facility];  Service: Orthopedics;  Laterality: Left;   LUMBAR LAMINECTOMY/DECOMPRESSION MICRODISCECTOMY N/A 05/04/2014   Procedure: MICRO LUMBAR DECOMPRESSION L2-L3, L3-L4;  Surgeon: Reyes JAYSON Billing, MD;  Location: [facility];  Service: Orthopedics;  Laterality: N/A;   Social History   Social History  Narrative   Not on file   Immunization History  Administered Date(s) Administered   Influenza,inj,quad, With Preservative 01/30/2017, 02/10/2018   Influenza-Unspecified 02/27/2012, 01/27/2013, 02/08/2014   Moderna Sars-Covid-2 Vaccination 07/14/2019, 08/09/2019, 12/16/2019   Pneumococcal-Unspecified 02/27/2012     Objective: Vital Signs: BP 139/86 (BP Location: Left Arm, Patient Position: Sitting, Cuff Size: Large)   Pulse 62   Resp 12   Ht 6' (1.829 m)   Wt (!) 348 lb (157.9 kg)   BMI 47.20 kg/m    Physical Exam Vitals and nursing note reviewed.  Constitutional:      Appearance: He is well-developed.  HENT:     Head: Normocephalic and atraumatic.   Eyes:     Conjunctiva/sclera: Conjunctivae normal.     Pupils: Pupils are equal, round, and reactive to light.    Cardiovascular:     Rate and Rhythm: Normal rate and regular rhythm.     Heart sounds: Normal heart sounds.  Pulmonary:     Effort: Pulmonary effort is normal.     Breath sounds: Normal breath sounds.  Abdominal:     General: Bowel sounds are normal.     Palpations: Abdomen is soft.   Musculoskeletal:     Cervical back: Normal range of motion and neck supple.   Skin:    General: Skin is warm and dry.     Capillary Refill: Capillary refill takes less than 2 seconds.   Neurological:     Mental Status: He is alert and oriented to person, place, and time.   Psychiatric:        Behavior: Behavior normal.      Musculoskeletal Exam: He had good range of motion of the cervical spine.  Mobility in the thoracic and lumbar spine was limited without any point tenderness.  There was no SI joint tenderness.  Shoulders, elbows, wrist joints, MCPs PIPs and DIPs were in good range of motion with the bilateral PIP and DIP thickening.  Knee joints were in good range of motion.  Hip joints could not be assessed in the seated position.  There was no tenderness over ankles or MTPs.  There was no Achilles tendinitis or  plantar fasciitis.  CDAI Exam: CDAI Score: -- Patient Global: --; Provider Global: -- Swollen: --; Tender: -- Joint Exam 10/28/2023   No joint exam has been documented for this visit   There is currently no information documented on the homunculus. Go to the Rheumatology activity and complete the homunculus joint exam.  Investigation: No additional findings.  Imaging: No results found.  Recent Labs: Lab Results  Component Value Date   WBC 6.7 07/29/2023   HGB 14.8 07/29/2023   PLT 274 07/29/2023   NA 140 07/29/2023   K 5.1 07/29/2023   CL 101 07/29/2023   CO2 23 07/29/2023   GLUCOSE 91 07/29/2023   BUN 15 07/29/2023   CREATININE 0.96 07/29/2023   BILITOT 0.4 07/29/2023   ALKPHOS 83 07/29/2023   AST 23 07/29/2023   ALT 21 07/29/2023   PROT 7.2 07/29/2023   ALBUMIN 4.0 07/29/2023  CALCIUM 9.5 07/29/2023   GFRAA 113 06/07/2020   QFTBGOLDPLUS NEGATIVE 12/04/2022    Speciality Comments: Approved for Humira  PAP through [employer] [facility] through 04/29/2019.  Procedures:  No procedures performed Allergies: Patient has no known allergies.   Assessment / Plan:     Visit Diagnoses: Spondyloarthropathy - Inflammatory arthritis, sacroiliitis.  He is doing well on Humira .  He was out of Humira  for about 6 months and then resumed Humira  1 week ago.  He states he experienced increased pain and stiffness while he was off Humira .  The symptoms are gradually improving.  He continued to be on methotrexate .  He had no synovitis on the examination.  He denies any plantar fasciitis or Achilles tendinitis.  He had no SI joint tenderness.  He continues to have stiffness in most of his joints.  High risk medication use - Humira  40 mg subcu every 14 days, methotrexate  7.5 mg weekly, folic acid  1 mg p.o. daily.  TB Gold - December 04, 2022. -In April 2025 CBC and CMP were normal.  Will recheck labs today including TB Gold.  Information reimmunization was placed in the AVS.  He was advised to hold  Humira  and methotrexate  if he develops an infection resume after the infection resolves.  He has been getting annual skin examination to screen for skin cancer.  Use of sunscreen and sun protection was discussed.  Plan: CBC with Differential/Platelet, Comprehensive metabolic panel with GFR, QuantiFERON-TB Gold Plus  Sacroiliitis (HCC)-he denies any SI joint discomfort today.  He had no tenderness on palpation.  Primary osteoarthritis of both hands -he had bilateral PIP and DIP thickening.  He continues to have stiffness in his both hands.  Trigger thumb of both hands-improved.  S/P arthroscopic surgery of left knee - Chronic discomfort.  Primary osteoarthritis of both feet-he is denies discomfort.  No plantar fasciitis or Achilles tendinitis was noted.  Degeneration of intervertebral disc of lumbar region without discogenic back pain or lower extremity pain -he has chronic pain and stiffness.  Lumbar laminectomy and decompression October 28, 2012 and May 04, 2014.   Primary hypertension-blood pressure was normal today at 139/86.  Other medical problems listed as follows:  History of colon polyps  Primary insomnia  History of sleep apnea  Family history of scleroderma  Orders: Orders Placed This Encounter  Procedures   CBC with Differential/Platelet   Comprehensive metabolic panel with GFR   QuantiFERON-TB Gold Plus   No orders of the defined types were placed in this encounter.    Follow-Up Instructions: Return in about 5 months (around 03/29/2024) for Spondyloarthropathy.   Maya Nash, MD  Note - This record has been created using Animal nutritionist.  Chart creation errors have been sought, but may not always  have been located. Such creation errors do not reflect on  the standard of medical care.

## 2023-10-20 ENCOUNTER — Ambulatory Visit: Payer: Medicare Other | Admitting: Rheumatology

## 2023-10-27 DIAGNOSIS — I10 Essential (primary) hypertension: Secondary | ICD-10-CM | POA: Diagnosis not present

## 2023-10-28 ENCOUNTER — Ambulatory Visit: Payer: Medicare Other | Attending: Rheumatology | Admitting: Rheumatology

## 2023-10-28 ENCOUNTER — Encounter: Payer: Self-pay | Admitting: Rheumatology

## 2023-10-28 VITALS — BP 139/86 | HR 62 | Resp 12 | Ht 72.0 in | Wt 348.0 lb

## 2023-10-28 DIAGNOSIS — Z9889 Other specified postprocedural states: Secondary | ICD-10-CM

## 2023-10-28 DIAGNOSIS — M51369 Other intervertebral disc degeneration, lumbar region without mention of lumbar back pain or lower extremity pain: Secondary | ICD-10-CM

## 2023-10-28 DIAGNOSIS — F5101 Primary insomnia: Secondary | ICD-10-CM | POA: Diagnosis not present

## 2023-10-28 DIAGNOSIS — M19042 Primary osteoarthritis, left hand: Secondary | ICD-10-CM

## 2023-10-28 DIAGNOSIS — Z8669 Personal history of other diseases of the nervous system and sense organs: Secondary | ICD-10-CM | POA: Diagnosis not present

## 2023-10-28 DIAGNOSIS — Z860100 Personal history of colon polyps, unspecified: Secondary | ICD-10-CM

## 2023-10-28 DIAGNOSIS — M19041 Primary osteoarthritis, right hand: Secondary | ICD-10-CM | POA: Diagnosis not present

## 2023-10-28 DIAGNOSIS — M47819 Spondylosis without myelopathy or radiculopathy, site unspecified: Secondary | ICD-10-CM

## 2023-10-28 DIAGNOSIS — Z79899 Other long term (current) drug therapy: Secondary | ICD-10-CM

## 2023-10-28 DIAGNOSIS — M461 Sacroiliitis, not elsewhere classified: Secondary | ICD-10-CM

## 2023-10-28 DIAGNOSIS — Z8269 Family history of other diseases of the musculoskeletal system and connective tissue: Secondary | ICD-10-CM

## 2023-10-28 DIAGNOSIS — M65311 Trigger thumb, right thumb: Secondary | ICD-10-CM | POA: Diagnosis not present

## 2023-10-28 DIAGNOSIS — M19071 Primary osteoarthritis, right ankle and foot: Secondary | ICD-10-CM | POA: Diagnosis not present

## 2023-10-28 DIAGNOSIS — I10 Essential (primary) hypertension: Secondary | ICD-10-CM | POA: Diagnosis not present

## 2023-10-28 DIAGNOSIS — M19072 Primary osteoarthritis, left ankle and foot: Secondary | ICD-10-CM

## 2023-10-28 DIAGNOSIS — M65312 Trigger thumb, left thumb: Secondary | ICD-10-CM

## 2023-10-28 NOTE — Patient Instructions (Signed)
 Standing Labs We placed an order today for your standing lab work.   Please have your standing labs drawn in October and every 3 months  Please have your labs drawn 2 weeks prior to your appointment so that the provider can discuss your lab results at your appointment, if possible.  Please note that you may see your imaging and lab results in MyChart before we have reviewed them. We will contact you once all results are reviewed. Please allow our office up to 72 hours to thoroughly review all of the results before contacting the office for clarification of your results.  WALK-IN LAB HOURS  Monday through Thursday from 8:00 am -12:30 pm and 1:00 pm-4:30 pm and Friday from 8:00 am-12:00 pm.  Patients with office visits requiring labs will be seen before walk-in labs.  You may encounter longer than normal wait times. Please allow additional time. Wait times may be shorter on  Monday and Thursday afternoons.  We do not book appointments for walk-in labs. We appreciate your patience and understanding with our staff.   Labs are drawn by [facility]. Please bring your co-pay at the time of your lab draw.  You may receive a bill from [facility] for your lab work.  Please note if you are on Hydroxychloroquine and and an order has been placed for a Hydroxychloroquine level,  you will need to have it drawn 4 hours or more after your last dose.  If you wish to have your labs drawn at another location, please call the office 24 hours in advance so we can fax the orders.  The office is located at [address], [place], [place] [address]   If you have any questions regarding directions or hours of operation,  please call [phone].   As a reminder, please drink plenty of water prior to coming for your lab work. Thanks!   Vaccines You are taking a medication(s) that can suppress your immune system.  The following immunizations are recommended: Flu annually Covid-19  Td/Tdap (tetanus,  diphtheria, pertussis) every 10 years Pneumonia (Prevnar 15 then Pneumovax 23 at least 1 year apart.  Alternatively, can take Prevnar 20 without needing additional dose) Shingrix: 2 doses from 4 weeks to 6 months apart  Please check with your PCP to make sure you are up to date. If you have signs or symptoms of an infection or start antibiotics: First, call your PCP for workup of your infection. Hold your medication through the infection, until you complete your antibiotics, and until symptoms resolve if you take the following: Injectable medication (Actemra, Benlysta, Cimzia, Cosentyx, Enbrel, Humira , Kevzara, Orencia, Remicade, Simponi, Stelara, Taltz, Tremfya) Methotrexate  Leflunomide (Arava) Mycophenolate (Cellcept) Earma, Olumiant, or Rinvoq  Please get an annual skin examination to screen for skin cancer while you are on Humira .  Please use sunscreen and sun protection.

## 2023-10-29 ENCOUNTER — Ambulatory Visit: Payer: Self-pay | Admitting: Rheumatology

## 2023-10-29 NOTE — Progress Notes (Signed)
 CBC and CMP are normal.

## 2023-10-31 LAB — COMPREHENSIVE METABOLIC PANEL WITH GFR
AG Ratio: 1.1 (calc) (ref 1.0–2.5)
ALT: 19 U/L (ref 9–46)
AST: 21 U/L (ref 10–35)
Albumin: 4 g/dL (ref 3.6–5.1)
Alkaline phosphatase (APISO): 75 U/L (ref 35–144)
BUN: 15 mg/dL (ref 7–25)
CO2: 26 mmol/L (ref 20–32)
Calcium: 9.7 mg/dL (ref 8.6–10.3)
Chloride: 105 mmol/L (ref 98–110)
Creat: 0.83 mg/dL (ref 0.70–1.35)
Globulin: 3.5 g/dL (ref 1.9–3.7)
Glucose, Bld: 95 mg/dL (ref 65–99)
Potassium: 4.7 mmol/L (ref 3.5–5.3)
Sodium: 139 mmol/L (ref 135–146)
Total Bilirubin: 0.5 mg/dL (ref 0.2–1.2)
Total Protein: 7.5 g/dL (ref 6.1–8.1)
eGFR: 100 mL/min/1.73m2 (ref >=60)

## 2023-10-31 LAB — CBC WITH DIFFERENTIAL/PLATELET
Absolute Lymphocytes: 1968 {cells}/uL (ref 850–3900)
Absolute Monocytes: 845 {cells}/uL (ref 200–950)
Basophils Absolute: 90 {cells}/uL (ref 0–200)
Basophils Relative: 1.1 %
Eosinophils Absolute: 312 {cells}/uL (ref 15–500)
Eosinophils Relative: 3.8 %
HCT: 46.3 % (ref 38.5–50.0)
Hemoglobin: 14.9 g/dL (ref 13.2–17.1)
MCH: 30.1 pg (ref 27.0–33.0)
MCHC: 32.2 g/dL (ref 32.0–36.0)
MCV: 93.5 fL (ref 80.0–100.0)
MPV: 11.2 fL (ref 7.5–12.5)
Monocytes Relative: 10.3 %
Neutro Abs: 4986 {cells}/uL (ref 1500–7800)
Neutrophils Relative %: 60.8 %
Platelets: 243 Thousand/uL (ref 140–400)
RBC: 4.95 Million/uL (ref 4.20–5.80)
RDW: 13.1 % (ref 11.0–15.0)
Total Lymphocyte: 24 %
WBC: 8.2 Thousand/uL (ref 3.8–10.8)

## 2023-10-31 LAB — QUANTIFERON-TB GOLD PLUS
Mitogen-NIL: 9.98 [IU]/mL
NIL: 0.01 [IU]/mL
QuantiFERON-TB Gold Plus: NEGATIVE
TB1-NIL: 0.01 [IU]/mL
TB2-NIL: 0.01 [IU]/mL

## 2023-10-31 NOTE — Progress Notes (Signed)
 CBC, CMP are normal, TB Gold negative

## 2023-11-05 ENCOUNTER — Other Ambulatory Visit: Payer: Self-pay | Admitting: Physician Assistant

## 2023-11-05 NOTE — Telephone Encounter (Signed)
 Last Fill: 08/15/2023  Labs: 10/28/2023 CBC and CMP are normal.   Next Visit: 03/29/2024  Last Visit: 10/28/2023  DX: Spondyloarthropathy   Current Dose per office note 10/28/2023: methotrexate  7.5 mg weekly   Okay to refill Methotrexate ?

## 2023-11-27 DIAGNOSIS — I10 Essential (primary) hypertension: Secondary | ICD-10-CM | POA: Diagnosis not present

## 2023-12-03 DIAGNOSIS — M069 Rheumatoid arthritis, unspecified: Secondary | ICD-10-CM | POA: Diagnosis not present

## 2023-12-03 DIAGNOSIS — R5383 Other fatigue: Secondary | ICD-10-CM | POA: Diagnosis not present

## 2023-12-03 DIAGNOSIS — Z0000 Encounter for general adult medical examination without abnormal findings: Secondary | ICD-10-CM | POA: Diagnosis not present

## 2023-12-03 DIAGNOSIS — I10 Essential (primary) hypertension: Secondary | ICD-10-CM | POA: Diagnosis not present

## 2023-12-03 DIAGNOSIS — G4730 Sleep apnea, unspecified: Secondary | ICD-10-CM | POA: Diagnosis not present

## 2023-12-03 DIAGNOSIS — Z299 Encounter for prophylactic measures, unspecified: Secondary | ICD-10-CM | POA: Diagnosis not present

## 2023-12-03 DIAGNOSIS — E7800 Pure hypercholesterolemia, unspecified: Secondary | ICD-10-CM | POA: Diagnosis not present

## 2023-12-03 DIAGNOSIS — Z713 Dietary counseling and surveillance: Secondary | ICD-10-CM | POA: Diagnosis not present

## 2023-12-03 DIAGNOSIS — Z79899 Other long term (current) drug therapy: Secondary | ICD-10-CM | POA: Diagnosis not present

## 2023-12-27 DIAGNOSIS — I10 Essential (primary) hypertension: Secondary | ICD-10-CM | POA: Diagnosis not present

## 2024-01-27 DIAGNOSIS — I10 Essential (primary) hypertension: Secondary | ICD-10-CM | POA: Diagnosis not present

## 2024-02-03 ENCOUNTER — Other Ambulatory Visit: Payer: Self-pay | Admitting: Physician Assistant

## 2024-02-04 ENCOUNTER — Other Ambulatory Visit: Payer: Self-pay | Admitting: *Deleted

## 2024-02-04 DIAGNOSIS — Z79899 Other long term (current) drug therapy: Secondary | ICD-10-CM

## 2024-02-04 NOTE — Telephone Encounter (Signed)
 Last Fill: 11/05/2023  Labs: 10/28/2023 CBC, CMP are norma   Next Visit: 03/29/2024  Last Visit: 10/28/2023  DX: Spondyloarthropathy   Current Dose per office note 10/28/2023: methotrexate  7.5 mg weekly   Left message to advise patient he is due to update his lab work.  Okay to refill Methotrexate ?

## 2024-02-10 DIAGNOSIS — Z79899 Other long term (current) drug therapy: Secondary | ICD-10-CM | POA: Diagnosis not present

## 2024-02-11 ENCOUNTER — Ambulatory Visit: Payer: Self-pay | Admitting: Rheumatology

## 2024-02-11 LAB — CBC WITH DIFFERENTIAL/PLATELET
Basophils Absolute: 0.1 x10E3/uL (ref 0.0–0.2)
Basos: 1 %
EOS (ABSOLUTE): 0.5 x10E3/uL — ABNORMAL HIGH (ref 0.0–0.4)
Eos: 6 %
Hematocrit: 46.5 % (ref 37.5–51.0)
Hemoglobin: 14.9 g/dL (ref 13.0–17.7)
Immature Grans (Abs): 0 x10E3/uL (ref 0.0–0.1)
Immature Granulocytes: 0 %
Lymphocytes Absolute: 1.7 x10E3/uL (ref 0.7–3.1)
Lymphs: 23 %
MCH: 30.8 pg (ref 26.6–33.0)
MCHC: 32 g/dL (ref 31.5–35.7)
MCV: 96 fL (ref 79–97)
Monocytes Absolute: 0.8 x10E3/uL (ref 0.1–0.9)
Monocytes: 11 %
Neutrophils Absolute: 4.3 x10E3/uL (ref 1.4–7.0)
Neutrophils: 59 %
Platelets: 275 x10E3/uL (ref 150–450)
RBC: 4.84 x10E6/uL (ref 4.14–5.80)
RDW: 12 % (ref 11.6–15.4)
WBC: 7.5 x10E3/uL (ref 3.4–10.8)

## 2024-02-11 LAB — CMP14+EGFR
ALT: 17 IU/L (ref 0–44)
AST: 19 IU/L (ref 0–40)
Albumin: 4.1 g/dL (ref 3.8–4.9)
Alkaline Phosphatase: 91 IU/L (ref 47–123)
BUN/Creatinine Ratio: 19 (ref 10–24)
BUN: 19 mg/dL (ref 8–27)
Bilirubin Total: 0.3 mg/dL (ref 0.0–1.2)
CO2: 25 mmol/L (ref 20–29)
Calcium: 9.7 mg/dL (ref 8.6–10.2)
Chloride: 103 mmol/L (ref 96–106)
Creatinine, Ser: 1.02 mg/dL (ref 0.76–1.27)
Globulin, Total: 3.4 g/dL (ref 1.5–4.5)
Glucose: 94 mg/dL (ref 70–99)
Potassium: 5.1 mmol/L (ref 3.5–5.2)
Sodium: 141 mmol/L (ref 134–144)
Total Protein: 7.5 g/dL (ref 6.0–8.5)
eGFR: 84 mL/min/1.73 (ref >59)

## 2024-02-11 NOTE — Progress Notes (Signed)
 CBC and CMP normal

## 2024-02-14 DIAGNOSIS — H40033 Anatomical narrow angle, bilateral: Secondary | ICD-10-CM | POA: Diagnosis not present

## 2024-02-14 DIAGNOSIS — H2513 Age-related nuclear cataract, bilateral: Secondary | ICD-10-CM | POA: Diagnosis not present

## 2024-03-01 ENCOUNTER — Other Ambulatory Visit: Payer: Self-pay

## 2024-03-01 MED ORDER — HUMIRA (2 PEN) 40 MG/0.4ML ~~LOC~~ AJKT: 40.0000 mg | AUTO-INJECTOR | SUBCUTANEOUS | 0 refills | Status: AC

## 2024-03-01 NOTE — Telephone Encounter (Signed)
 Last Fill: 05/26/2023 (sent with [employer] enrollment application)   Labs: 02/10/2024 CBC and CMP normal.   TB Gold: 10/28/2023 negative    Next Visit: 03/29/2024  Last Visit: 10/28/2023  IK:Denwibonjmuymnejuyb   Current Dose per office note on 10/28/2023: Humira  40 mg subcu every 14 days   Okay to refill Humira ?

## 2024-03-05 ENCOUNTER — Other Ambulatory Visit: Payer: Self-pay | Admitting: Rheumatology

## 2024-03-05 NOTE — Telephone Encounter (Signed)
 Last Fill: 02/04/2024  Labs: 02/10/2024 CBC and CMP normal.   Next Visit: 03/29/2024  Last Visit: 10/28/2023  DX: Spondyloarthropathy   Current Dose per office note 10/28/2023: methotrexate  7.5 mg weekly   Okay to refill Methotrexate ?

## 2024-03-15 NOTE — Progress Notes (Unsigned)
 Office Visit Note  Patient: Charles Jenkins             Date of Birth: 02-12-1961           MRN: [id number]             PCP: Rosamond Leta NOVAK, MD Referring: Rosamond Leta NOVAK, MD Visit Date: 03/29/2024 Occupation: Data Unavailable  Subjective:  Medicdation monitoring  History of Present Illness: Charles Jenkins is a 64 y.o. male with history of spondyloarthropathy and osteoarthritis.  Patient remains on Humira  40 mg sq every 14 days, methotrexate  7.5 mg 3 tablets once weekly, folic acid  1 mg p.o. daily.  He is tolerating combination therapy without any side effects and has not had any gaps in therapy.  He denies any signs or symptoms of a flare recently.  He experiences some stiffness in his lower back pursing in the mornings but overall symptoms have been manageable.  He denies any nocturnal pain.  He denies any upcoming procedures or surgeries.  He denies any new medical conditions.  He has not had any recent or recurrent infections.   Activities of Daily Living:  Patient reports morning stiffness for 15-20 minutes Patient Denies nocturnal pain.  Difficulty dressing/grooming: Denies Difficulty climbing stairs: Reports Difficulty getting out of chair: Reports Difficulty using hands for taps, buttons, cutlery, and/or writing: Denies  Review of Systems  Constitutional:  Negative for fatigue and night sweats.  HENT:  Negative for mouth sores, mouth dryness and nose dryness.   Eyes:  Negative for redness and dryness.  Respiratory:  Negative for cough, shortness of breath and difficulty breathing.   Cardiovascular:  Negative for chest pain, palpitations, hypertension, irregular heartbeat and swelling in legs/feet.  Gastrointestinal:  Negative for constipation and diarrhea.  Endocrine: Negative.  Negative for increased urination.  Genitourinary:  Negative for painful urination.  Musculoskeletal:  Positive for joint pain, joint pain and morning stiffness. Negative for joint swelling, myalgias,  muscle weakness, muscle tenderness and myalgias.  Skin:  Negative for color change, rash, hair loss, nodules/bumps, skin tightness, ulcers and sensitivity to sunlight.  Allergic/Immunologic: Negative for susceptible to infections.  Neurological:  Negative for dizziness, fainting, memory loss, night sweats and weakness.  Hematological:  Negative for swollen glands.  Psychiatric/Behavioral:  Negative for depressed mood and sleep disturbance. The patient is not nervous/anxious.     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   Insomnia 03/10/2013   Spinal stenosis of lumbar region 10/28/2012   Sciatica 09/23/2012   Essential hypertension, benign 07/24/2012   Sacroiliitis 07/24/2012   Depression 07/24/2012    Past Medical History:  Diagnosis Date   Ankylosing spondylitis (HCC)    Arthritis    Back pain    Chronic right SI joint pain    Hypertension    only in doctor's offices   Sleep apnea    per patient     Family History  Problem Relation Age of Onset   Scleroderma Mother    Past Surgical History:  Procedure  Laterality Date   KNEE ARTHROPLASTY Left 2018   LUMBAR LAMINECTOMY/DECOMPRESSION MICRODISCECTOMY Left 10/28/2012   Procedure: MICRO LUMBAR DECOMPRESSION L4 - L5 and L5 - S1 2 LEVELS;  Surgeon: Reyes JAYSON Billing, MD;  Location: [facility];  Service: Orthopedics;  Laterality: Left;   LUMBAR LAMINECTOMY/DECOMPRESSION MICRODISCECTOMY N/A 05/04/2014   Procedure: MICRO LUMBAR  DECOMPRESSION L2-L3, L3-L4;  Surgeon: Reyes JAYSON Billing, MD;  Location: [facility];  Service: Orthopedics;  Laterality: N/A;   Social History   Tobacco Use   Smoking status: Former    Types: Cigars    Passive exposure: Never   Smokeless tobacco: Current    Types: Snuff  Vaping Use   Vaping status: Never Used  Substance Use Topics   Alcohol use: No   Drug use: No   Social History   Social History Narrative   Not on file     Immunization History  Administered Date(s) Administered   Influenza,inj,quad, With Preservative 01/30/2017, 02/10/2018   Influenza-Unspecified 02/27/2012, 01/27/2013, 02/08/2014   Moderna Sars-Covid-2 Vaccination 07/14/2019, 08/09/2019, 12/16/2019   Pneumococcal-Unspecified 02/27/2012     Objective: Vital Signs: BP 138/82   Pulse (!) 59   Temp 98.8 F (37.1 C)   Resp 14   Ht 6' (1.829 m)   Wt (!) 344 lb (156 kg)   BMI 46.65 kg/m    Physical Exam Vitals and nursing note reviewed.  Constitutional:      Appearance: He is well-developed.  HENT:     Head: Normocephalic and atraumatic.  Eyes:     Conjunctiva/sclera: Conjunctivae normal.     Pupils: Pupils are equal, round, and reactive to light.  Cardiovascular:     Rate and Rhythm: Normal rate and regular rhythm.     Heart sounds: Normal heart sounds.  Pulmonary:     Effort: Pulmonary effort is normal.     Breath sounds: Normal breath sounds.  Abdominal:     General: Bowel sounds are normal.     Palpations: Abdomen is soft.  Musculoskeletal:     Cervical back: Normal range of motion and neck supple.  Skin:    General: Skin is warm and dry.     Capillary Refill: Capillary refill takes less than 2 seconds.  Neurological:     Mental Status: He is alert and oriented to person, place, and time.  Psychiatric:        Behavior: Behavior normal.      Musculoskeletal Exam: Patient remained seated during examination today.  C-spine has good range of motion with no discomfort.  No midline spinal  tenderness.  No SI joint tenderness.  Shoulder joints, elbow joints, wrist joints, MCPs, PIPs, DIPs have good range of motion with no synovitis.  PIP and DIP thickening consistent with osteoarthritis of both hands.  Complete fist formation bilaterally.  Hip joints difficult to assess in seated position.  Knee joints have good range of motion no warmth or effusion.  Ankle joints have good range of motion no tenderness or joint swelling.  No evidence of Achilles tendinitis.    CDAI Exam: CDAI Score: -- Patient Global: --; Provider Global: -- Swollen: --; Tender: -- Joint Exam 03/29/2024   No joint exam has been documented for this visit   There is currently no information documented on the homunculus. Go to the Rheumatology activity and complete the homunculus joint exam.  Investigation: No additional findings.  Imaging: No results found.  Recent Labs: Lab Results  Component Value Date   WBC 7.5 02/10/2024  HGB 14.9 02/10/2024   PLT 275 02/10/2024   NA 141 02/10/2024   K 5.1 02/10/2024   CL 103 02/10/2024   CO2 25 02/10/2024   GLUCOSE 94 02/10/2024   BUN 19 02/10/2024   CREATININE 1.02 02/10/2024   BILITOT 0.3 02/10/2024   ALKPHOS 91 02/10/2024   AST 19 02/10/2024   ALT 17 02/10/2024   PROT 7.5 02/10/2024   ALBUMIN 4.1 02/10/2024   CALCIUM 9.7 02/10/2024   GFRAA 113 06/07/2020   QFTBGOLDPLUS NEGATIVE 10/28/2023    Speciality Comments: Approved for Humira  PAP through [facility] through 04/29/2019.  Procedures:  No procedures performed Allergies: Patient has no known allergies.   Assessment / Plan:     Visit Diagnoses: Spondyloarthropathy - Inflammatory arthritis, sacroiliitis: He has no synovitis or dactylitis on examination today.  No SI joint tenderness upon palpation.  He has clinically been doing well on Humira  40 mg subcutaneous injections once every 14 days and methotrexate  3 tablets by mouth once weekly.  He is tolerating combination therapy without any  side effects.  No recent gaps in therapy.  No recent or recurrent infections.  No upcoming surgeries or procedures scheduled. His morning stiffness has been lasting for about 15 to 20 minutes daily.  No nocturnal pain.  No medication changes will be made at this time.  He is advised to notify us  if he develop signs or symptoms of a flare.  He will follow-up in the office in 5 months or sooner if needed.  High risk medication use - Humira  40 mg sq every 14 days, methotrexate  7.5 mg 3 tablets by mouth once weekly, folic acid  1 mg p.o. daily. CBC and CMP updated on 02/10/24.  His next lab work will be due in mid January and every 3 months to monitor for drug toxicity. TB gold negative on 10/28/23 No recent or recurrent infections.  Discussed the importance of holding humira  and methotrexate  if he develops signs or symptoms of an infection and to resume once the infection has completely cleared.   Sacroiliitis: No SI joint tenderness upon palpation.  No nocturnal pain.  Primary osteoarthritis of both hands: PIP and DIP thickening consistent with osteoarthritis of both hands.  No synovitis noted.  Trigger thumb of both hands: Intermittent locking.  Not currently symptomatic.   S/P arthroscopic surgery of left knee: No warmth or effusion noted.  Primary osteoarthritis of both feet: He has good range of motion of both ankle joints with no tenderness or joint swelling.  No evidence of Achilles tendinitis.  He is wearing proper fitting shoes.  Degeneration of intervertebral disc of lumbar region without discogenic back pain or lower extremity pain - Lumbar laminectomy and decompression October 28, 2012 and May 04, 2014.  He experiences some stiffness in his lower back first thing in the mornings but has not had any nocturnal pain.  His symptoms have been manageable overall.  Other medical conditions are listed as follows:  Primary hypertension: BP was 138/82 today in the office.   History of colon  polyps  Primary insomnia  History of sleep apnea  Family history of scleroderma  Orders: No orders of the defined types were placed in this encounter.  No orders of the defined types were placed in this encounter.    Follow-Up Instructions: Return in about 5 months (around 08/27/2024) for Spondyloarthropathy, Osteoarthritis.   Waddell CHRISTELLA Craze, PA-C  Note - This record has been created using Dragon software.  Chart creation errors have been sought,  but may not always  have been located. Such creation errors do not reflect on  the standard of medical care.

## 2024-03-29 ENCOUNTER — Encounter: Payer: Self-pay | Admitting: Physician Assistant

## 2024-03-29 ENCOUNTER — Ambulatory Visit: Attending: Physician Assistant | Admitting: Physician Assistant

## 2024-03-29 VITALS — BP 138/82 | HR 59 | Temp 98.8°F | Resp 14 | Ht 72.0 in | Wt 344.0 lb

## 2024-03-29 DIAGNOSIS — M19071 Primary osteoarthritis, right ankle and foot: Secondary | ICD-10-CM

## 2024-03-29 DIAGNOSIS — M19042 Primary osteoarthritis, left hand: Secondary | ICD-10-CM

## 2024-03-29 DIAGNOSIS — F5101 Primary insomnia: Secondary | ICD-10-CM

## 2024-03-29 DIAGNOSIS — Z860100 Personal history of colon polyps, unspecified: Secondary | ICD-10-CM

## 2024-03-29 DIAGNOSIS — M461 Sacroiliitis, not elsewhere classified: Secondary | ICD-10-CM

## 2024-03-29 DIAGNOSIS — M65311 Trigger thumb, right thumb: Secondary | ICD-10-CM

## 2024-03-29 DIAGNOSIS — M51369 Other intervertebral disc degeneration, lumbar region without mention of lumbar back pain or lower extremity pain: Secondary | ICD-10-CM

## 2024-03-29 DIAGNOSIS — M19072 Primary osteoarthritis, left ankle and foot: Secondary | ICD-10-CM

## 2024-03-29 DIAGNOSIS — Z8269 Family history of other diseases of the musculoskeletal system and connective tissue: Secondary | ICD-10-CM

## 2024-03-29 DIAGNOSIS — M47819 Spondylosis without myelopathy or radiculopathy, site unspecified: Secondary | ICD-10-CM | POA: Diagnosis not present

## 2024-03-29 DIAGNOSIS — M65312 Trigger thumb, left thumb: Secondary | ICD-10-CM

## 2024-03-29 DIAGNOSIS — Z9889 Other specified postprocedural states: Secondary | ICD-10-CM

## 2024-03-29 DIAGNOSIS — M19041 Primary osteoarthritis, right hand: Secondary | ICD-10-CM

## 2024-03-29 DIAGNOSIS — Z8669 Personal history of other diseases of the nervous system and sense organs: Secondary | ICD-10-CM

## 2024-03-29 DIAGNOSIS — Z79899 Other long term (current) drug therapy: Secondary | ICD-10-CM

## 2024-03-29 DIAGNOSIS — I10 Essential (primary) hypertension: Secondary | ICD-10-CM

## 2024-03-29 NOTE — Patient Instructions (Signed)
 Standing Labs We placed an order today for your standing lab work.   Please have your standing labs drawn in Mid-January and every 3 months    Please have your labs drawn 2 weeks prior to your appointment so that the provider can discuss your lab results at your appointment, if possible.  Please note that you may see your imaging and lab results in MyChart before we have reviewed them. We will contact you once all results are reviewed. Please allow our office up to 72 hours to thoroughly review all of the results before contacting the office for clarification of your results.  WALK-IN LAB HOURS  Monday through Thursday from 8:00 am - 4:30 pm and Friday from 8:00 am-12:00 pm.  Patients with office visits requiring labs will be seen before walk-in labs.  You may encounter longer than normal wait times. Please allow additional time. Wait times may be shorter on  Monday and Thursday afternoons.  We do not book appointments for walk-in labs. We appreciate your patience and understanding with our staff.   Labs are drawn by [facility]. Please bring your co-pay at the time of your lab draw.  You may receive a bill from [facility] for your lab work.  Please note if you are on Hydroxychloroquine and and an order has been placed for a Hydroxychloroquine level,  you will need to have it drawn 4 hours or more after your last dose.  If you wish to have your labs drawn at another location, please call the office 24 hours in advance so we can fax the orders.  The office is located at [address], [place], [place] [address]   If you have any questions regarding directions or hours of operation,  please call [phone].   As a reminder, please drink plenty of water prior to coming for your lab work. Thanks!

## 2024-05-11 ENCOUNTER — Other Ambulatory Visit: Payer: Self-pay

## 2024-05-11 MED ORDER — FOLIC ACID 1 MG PO TABS: 1.0000 mg | ORAL_TABLET | Freq: Every day | ORAL | 3 refills | Status: AC

## 2024-05-11 NOTE — Telephone Encounter (Signed)
 Patient called the office stating that the pharmacy advised that they had sent a RX request and it was denied.   Last Fill: 04/09/2023  Next Visit: 08/30/2024  Last Visit: 03/29/2024  Dx: Spondyloarthropathy   Current Dose per office note on 03/29/2024: folic acid  1 mg p.o. daily.   Okay to refill Folic Acid ?

## 2024-05-26 ENCOUNTER — Other Ambulatory Visit: Payer: Self-pay | Admitting: Rheumatology

## 2024-05-26 NOTE — Telephone Encounter (Signed)
 Last Fill: 03/07/2024  Labs: 02/10/2024 CBC and CMP normal.   Next Visit: 08/30/2024  Last Visit: 03/29/2024  DX: Spondyloarthropathy   Current Dose per office note 03/29/2024: methotrexate  7.5 mg 3 tablets by mouth once weekly   Patient states he will go to update labs when the weather permits.   Okay to refill Methotrexate ?

## 2024-05-28 ENCOUNTER — Telehealth: Payer: Self-pay

## 2024-05-28 NOTE — Telephone Encounter (Signed)
 Patient called stating that [facility] does not have the methotrexate  prescription that was sent in on 05/26/2024. I called [facility] and they do have rx and will get it ready for the patient. I attempted to contact patient and left message on machine to advise him.

## 2024-06-04 ENCOUNTER — Other Ambulatory Visit: Payer: Self-pay

## 2024-06-04 DIAGNOSIS — Z79899 Other long term (current) drug therapy: Secondary | ICD-10-CM

## 2024-08-30 ENCOUNTER — Ambulatory Visit: Admitting: Physician Assistant
